# Patient Record
Sex: Female | Born: 1950 | Race: White | Hispanic: No | Marital: Married | Smoking: Never smoker
Health system: Southern US, Community
[De-identification: ages and names within clinical notes are randomized; demographics above are authoritative.]

## PROBLEM LIST (undated history)

## (undated) DIAGNOSIS — F32A Depression, unspecified: Secondary | ICD-10-CM

## (undated) DIAGNOSIS — H269 Unspecified cataract: Secondary | ICD-10-CM

## (undated) DIAGNOSIS — F419 Anxiety disorder, unspecified: Secondary | ICD-10-CM

## (undated) DIAGNOSIS — H409 Unspecified glaucoma: Secondary | ICD-10-CM

## (undated) DIAGNOSIS — F329 Major depressive disorder, single episode, unspecified: Secondary | ICD-10-CM

## (undated) DIAGNOSIS — Z5189 Encounter for other specified aftercare: Secondary | ICD-10-CM

## (undated) DIAGNOSIS — M1990 Unspecified osteoarthritis, unspecified site: Secondary | ICD-10-CM

## (undated) DIAGNOSIS — I4891 Unspecified atrial fibrillation: Secondary | ICD-10-CM

## (undated) DIAGNOSIS — I779 Disorder of arteries and arterioles, unspecified: Secondary | ICD-10-CM

## (undated) DIAGNOSIS — I10 Essential (primary) hypertension: Secondary | ICD-10-CM

## (undated) DIAGNOSIS — I499 Cardiac arrhythmia, unspecified: Secondary | ICD-10-CM

## (undated) DIAGNOSIS — K219 Gastro-esophageal reflux disease without esophagitis: Secondary | ICD-10-CM

## (undated) DIAGNOSIS — D649 Anemia, unspecified: Secondary | ICD-10-CM

## (undated) DIAGNOSIS — I2510 Atherosclerotic heart disease of native coronary artery without angina pectoris: Secondary | ICD-10-CM

## (undated) DIAGNOSIS — E663 Overweight: Secondary | ICD-10-CM

## (undated) DIAGNOSIS — J309 Allergic rhinitis, unspecified: Secondary | ICD-10-CM

## (undated) DIAGNOSIS — E785 Hyperlipidemia, unspecified: Secondary | ICD-10-CM

## (undated) HISTORY — PX: OTHER SURGICAL HISTORY: SHX169

## (undated) HISTORY — PX: COLONOSCOPY: SHX174

## (undated) HISTORY — PX: EYE SURGERY: SHX253

## (undated) HISTORY — DX: Encounter for other specified aftercare: Z51.89

## (undated) HISTORY — PX: GLAUCOMA SURGERY: SHX656

## (undated) HISTORY — PX: TONSILLECTOMY: SUR1361

## (undated) HISTORY — DX: Anxiety disorder, unspecified: F41.9

## (undated) HISTORY — PX: KNEE ARTHROSCOPY: SUR90

## (undated) HISTORY — DX: Depression, unspecified: F32.A

## (undated) HISTORY — DX: Unspecified glaucoma: H40.9

## (undated) HISTORY — DX: Unspecified cataract: H26.9

## (undated) HISTORY — DX: Anemia, unspecified: D64.9

## (undated) HISTORY — DX: Unspecified osteoarthritis, unspecified site: M19.90

## (undated) HISTORY — PX: CATARACT EXTRACTION: SUR2

## (undated) HISTORY — DX: Essential (primary) hypertension: I10

## (undated) HISTORY — PX: ABDOMINAL HYSTERECTOMY: SHX81

## (undated) HISTORY — PX: CARDIAC CATHETERIZATION: SHX172

## (undated) HISTORY — PX: WISDOM TOOTH EXTRACTION: SHX21

## (undated) HISTORY — PX: ROTATOR CUFF REPAIR: SHX139

## (undated) HISTORY — DX: Allergic rhinitis, unspecified: J30.9

## (undated) HISTORY — DX: Major depressive disorder, single episode, unspecified: F32.9

## (undated) HISTORY — DX: Hyperlipidemia, unspecified: E78.5

## (undated) HISTORY — DX: Overweight: E66.3

## (undated) HISTORY — PX: UPPER GASTROINTESTINAL ENDOSCOPY: SHX188

---

## 1999-02-12 HISTORY — PX: OTHER SURGICAL HISTORY: SHX169

## 2004-07-30 ENCOUNTER — Ambulatory Visit (HOSPITAL_COMMUNITY): Admission: RE | Admit: 2004-07-30 | Discharge: 2004-07-30 | Payer: Self-pay | Admitting: Orthopedic Surgery

## 2004-07-30 ENCOUNTER — Ambulatory Visit (HOSPITAL_BASED_OUTPATIENT_CLINIC_OR_DEPARTMENT_OTHER): Admission: RE | Admit: 2004-07-30 | Discharge: 2004-07-30 | Payer: Self-pay | Admitting: Orthopedic Surgery

## 2011-11-13 ENCOUNTER — Telehealth: Payer: Self-pay | Admitting: *Deleted

## 2011-11-13 NOTE — Telephone Encounter (Signed)
Spoke with patients husband and said that the patient will bring medical records to appointment from [facility]

## 2011-11-13 NOTE — Telephone Encounter (Signed)
Spoke with patients primary to get records faxed over patient needs to sign a release form. Primary booked patient another appointment with another cardiologist and already sent records to them but is scheduled with [facility] to see Dr. Excell Seltzer on December 03, 2011

## 2011-12-03 ENCOUNTER — Encounter: Payer: Self-pay | Admitting: Cardiovascular Disease

## 2011-12-03 ENCOUNTER — Ambulatory Visit (INDEPENDENT_AMBULATORY_CARE_PROVIDER_SITE_OTHER): Payer: Managed Care, Other (non HMO) | Admitting: Cardiovascular Disease

## 2011-12-03 ENCOUNTER — Encounter: Payer: Self-pay | Admitting: *Deleted

## 2011-12-03 VITALS — BP 138/73 | HR 74 | Ht 68.0 in | Wt 213.8 lb

## 2011-12-03 DIAGNOSIS — I10 Essential (primary) hypertension: Secondary | ICD-10-CM

## 2011-12-03 DIAGNOSIS — E785 Hyperlipidemia, unspecified: Secondary | ICD-10-CM | POA: Insufficient documentation

## 2011-12-03 DIAGNOSIS — R0989 Other specified symptoms and signs involving the circulatory and respiratory systems: Secondary | ICD-10-CM

## 2011-12-03 DIAGNOSIS — R072 Precordial pain: Secondary | ICD-10-CM

## 2011-12-03 DIAGNOSIS — R079 Chest pain, unspecified: Secondary | ICD-10-CM

## 2011-12-03 NOTE — Assessment & Plan Note (Signed)
The patient has exertional symptoms of neck/jaw pain and dyspnea which certainly could represent an anginal equivalent. Her symptoms are CCS Class 2. She has risk factors of HTN, hyperlipidemia, and family history of CAD. Her EKG is abnormal but nonspecific. I have recommended a Lexiscan Myoview for further risk stratification. If her Myoview is normal, she can follow-up in 1-2 years. If abnormal, will consider further evaluation with cardiac catheterization. As long as low risk, likely will pursue medical therapy as her clinical symptoms are not lifestyle-limiting. She should remain on daily ASA.

## 2011-12-03 NOTE — Patient Instructions (Addendum)
Your physician recommends that you continue on your current medications as directed. Please refer to the Current Medication list given to you today.  Your physician has requested that you have a lexiscan myoview. For further information please visit [email or web address] Please follow instruction sheet, as given.DX: [phone]  Your physician wants you to follow-up in: 2 years with Dr. Excell Seltzer.  You will receive a reminder letter in the mail two months in advance. If you don't receive a letter, please call our office at [phone]  to schedule the follow-up appointment.

## 2011-12-03 NOTE — Assessment & Plan Note (Signed)
Lipids borderline. She is in the process of losing weight (has just lost 13# through diet). Would continue with lifestyle modification for now.

## 2011-12-03 NOTE — Progress Notes (Signed)
Patient ID: Vanessa [name], female   DOB: 1950/09/16, 61 y.o.   MRN: [id number]   HPI:  61 year-old woman presenting for cardiac evaluation. She was noted to have an abnormal EKG with an incomplete RBBB pattern at a recent office visit. She notes exertional symptoms of back, neck, and jaw discomfort with heavy exertion. She does not have these symptoms with moderate level activities such as climbing stairs. She complains of exertional dyspnea and palpitations. She denies lightheadedness, syncope, or resting chest pain. Her work has been more stressful of late. She reports undergoing a cardiac cath about 12-15 years ago and thinks this was normal. She has had no recent cardiac testing. CV risk factors include family history of CAD, borderline hyperlipidemia, and HTN. She is a never smoker and has no hx of diabetes.  Outpatient Encounter Prescriptions as of 12/03/2011  Medication Sig Dispense Refill  . aspirin 81 MG chewable tablet Chew 81 mg by mouth daily.      . brimonidine (ALPHAGAN) 0.15 % ophthalmic solution       . citalopram (CELEXA) 20 MG tablet Take 20 mg by mouth daily.       . Cyanocobalamin (VITAMIN B 12 PO) Take 6,000 mcg by mouth daily.      Marland Kitchen losartan-hydrochlorothiazide (HYZAAR) 100-25 MG per tablet Take 1 tablet by mouth daily.       Marland Kitchen LUMIGAN 0.01 % SOLN       . Multiple Vitamin (MULTIVITAMIN) capsule Take 1 capsule by mouth daily.      Marland Kitchen NEXIUM 40 MG capsule Take 40 mg by mouth daily before breakfast.       . omega-3 acid ethyl esters (LOVAZA) 1 G capsule Take 1 g by mouth daily.      Marland Kitchen pyridOXINE (VITAMIN B-6) 100 MG tablet Take 100 mg by mouth daily.        Codeine  Past Medical History  Diagnosis Date  . Glaucoma   . HTN (hypertension)   . AR (allergic rhinitis)   . Anxiety   . Depressed   . Overweight     Past Surgical History  Procedure Date  . Eye surgery     x2  . Glaucoma surgery     bilateral  . Cataract extraction   . Wisdom tooth extraction   .  Cyrotherapy   . Catherization 11/00    normal  . Abdominal hysterectomy     History   Social History  . Marital Status: Single    Spouse Name: N/A    Number of Children: N/A  . Years of Education: N/A   Occupational History  . Not on file.   Social History Main Topics  . Smoking status: Never Smoker   . Smokeless tobacco: Not on file  . Alcohol Use: No  . Drug Use: No  . Sexually Active: Not on file   Other Topics Concern  . Not on file   Social History Narrative  . No narrative on file    Family History: Father died of an MI at 81, Mother - pacemaker, Brother - HTN  ROS: General: no fevers/chills/night sweats Eyes: no blurry vision, diplopia, or amaurosis ENT: no sore throat or hearing loss Resp: no cough, wheezing, or hemoptysis CV: no edema or palpitations GI: no abdominal pain, nausea, vomiting, diarrhea, or constipation GU: no dysuria, frequency, or hematuria Skin: no rash Neuro: no headache, numbness, tingling, or weakness of extremities Musculoskeletal: no joint pain or swelling Heme: no bleeding, DVT, or easy  bruising Endo: no polydipsia or polyuria  BP 138/73  Pulse 74  Ht 5\' 8"  (1.727 m)  Wt 96.979 kg (213 lb 12.8 oz)  BMI 32.51 kg/m2  PHYSICAL EXAM: Pt is alert and oriented, WD, WN, in no distress. HEENT: normal Neck: JVP normal. Carotid upstrokes normal without bruits. No thyromegaly. Lungs: equal expansion, clear bilaterally CV: Apex is discrete and nondisplaced, RRR without murmur or gallop Abd: soft, NT, +BS, no bruit, no hepatosplenomegaly Back: no CVA tenderness Ext: no C/C/E        Femoral pulses 2+= without bruits        DP/PT pulses intact and = Skin: warm and dry without rash Neuro: CNII-XII intact             Strength intact = bilaterally  EKG:  NSR with incomplete RBBB, HR 69 bpm, tracing dated 09/29/11  Lab review: Chol 189, LDL 117, HDL 39, Trig 167  ASSESSMENT AND PLAN:

## 2011-12-03 NOTE — Assessment & Plan Note (Signed)
Controlled on current Rx 

## 2011-12-15 ENCOUNTER — Ambulatory Visit (HOSPITAL_COMMUNITY): Payer: Managed Care, Other (non HMO) | Attending: Cardiovascular Disease | Admitting: Radiology

## 2011-12-15 VITALS — BP 147/68 | HR 58 | Ht 68.0 in | Wt 210.0 lb

## 2011-12-15 DIAGNOSIS — Z8249 Family history of ischemic heart disease and other diseases of the circulatory system: Secondary | ICD-10-CM | POA: Insufficient documentation

## 2011-12-15 DIAGNOSIS — I4510 Unspecified right bundle-branch block: Secondary | ICD-10-CM

## 2011-12-15 DIAGNOSIS — R000 Tachycardia, unspecified: Secondary | ICD-10-CM | POA: Insufficient documentation

## 2011-12-15 DIAGNOSIS — R0609 Other forms of dyspnea: Secondary | ICD-10-CM

## 2011-12-15 DIAGNOSIS — I10 Essential (primary) hypertension: Secondary | ICD-10-CM | POA: Insufficient documentation

## 2011-12-15 DIAGNOSIS — R072 Precordial pain: Secondary | ICD-10-CM

## 2011-12-15 DIAGNOSIS — R079 Chest pain, unspecified: Secondary | ICD-10-CM | POA: Insufficient documentation

## 2011-12-15 MED ORDER — TECHNETIUM TC 99M TETROFOSMIN IV KIT
30.0000 | PACK | Freq: Once | INTRAVENOUS | Status: AC | PRN
  Administered 2011-12-15: 30 via INTRAVENOUS

## 2011-12-15 MED ORDER — REGADENOSON 0.4 MG/5ML IV SOLN
0.4000 mg | Freq: Once | INTRAVENOUS | Status: AC
  Administered 2011-12-15: 0.4 mg via INTRAVENOUS

## 2011-12-15 MED ORDER — TECHNETIUM TC 99M TETROFOSMIN IV KIT: 33.0000 | PACK | Freq: Once | INTRAVENOUS | Status: DC | PRN

## 2011-12-15 MED ORDER — TECHNETIUM TC 99M TETROFOSMIN IV KIT
11.0000 | PACK | Freq: Once | INTRAVENOUS | Status: AC | PRN
  Administered 2011-12-15: 11 via INTRAVENOUS

## 2011-12-15 MED ORDER — REGADENOSON 0.4 MG/5ML IV SOLN: 0.4000 mg | Freq: Once | INTRAVENOUS | Status: DC

## 2011-12-15 MED ORDER — TECHNETIUM TC 99M TETROFOSMIN IV KIT: 10.0000 | PACK | Freq: Once | INTRAVENOUS | Status: DC | PRN

## 2011-12-15 NOTE — Progress Notes (Signed)
[facility] SITE 3 NUCLEAR MED [address] [place] [address] [phone]  Cardiology Nuclear Med Study  Vanessa [name] is a 61 y.o. female     MRN : [id number]     DOB: 02/04/1951  Procedure Date: 12/15/2011  Nuclear Med Background Indication for Stress Test:  Evaluation for Ischemia and Abnormal EKG History:  '00 GXT:(+)>Cath:Normal Cardiac Risk Factors: Family History - CAD, Hypertension, Lipids, Overweight and RBBB  Symptoms:  Chest Pain>Back/(L) Shoulder, Exertional Neck/Jaw Pain (last episode of chest discomfort was about 40-months ago), Diaphoresis and Rapid HR   Nuclear Pre-Procedure Caffeine/Decaff Intake:  None > 12 hrs NPO After: 8:00pm   Lungs:  Clear. O2 Sat: 96% on room air. IV 0.9% NS with Angio Cath:  20g  IV Site: L Antecubital x 1, tolerated well IV Started by:  Irean Hong, RN  Chest Size (in):  40 Cup Size: C  Height: 5\' 8"  (1.727 m)  Weight:  210 lb (95.255 kg)  BMI:  Body mass index is 31.93 kg/(m^2). Tech Comments:  n/a    Nuclear Med Study 1 or 2 day study: 1 day  Stress Test Type:  Treadmill/Lexiscan  Reading MD: Olga Millers, MD  Order Authorizing Provider:  Tonny Bollman, MD  Resting Radionuclide: Technetium 68m Tetrofosmin  Resting Radionuclide Dose: 11.0 mCi   Stress Radionuclide:  Technetium 62m Tetrofosmin  Stress Radionuclide Dose: 33.0 mCi           Stress Protocol Rest HR: 58 Stress HR: 117  Rest BP: 147/68 Stress BP: 202/47  Exercise Time (min): 2:00 METS: n/a   Predicted Max HR: 158 bpm % Max HR: 74.05 bpm Rate Pressure Product: 21308   Dose of Adenosine (mg):  n/a Dose of Lexiscan: 0.4 mg  Dose of Atropine (mg): n/a Dose of Dobutamine: n/a mcg/kg/min (at max HR)  Stress Test Technologist: Smiley Houseman, CMA-N  Nuclear Technologist:  Domenic Polite, CNMT     Rest Procedure:  Myocardial perfusion imaging was performed at rest 45 minutes following the intravenous administration of Technetium 21m  Tetrofosmin.  Rest ECG: IRBBB with occasional PVC.  Stress Procedure:  The patient received IV Lexiscan 0.4 mg over 15-seconds with concurrent low level exercise and then Technetium 46m Tetrofosmin was injected at 30-seconds while the patient continued walking one more minute. There were ST-T wave changes and occasional PVC's noted with Lexiscan.  She had a hypertensive response.   Quantitative spect images were obtained after a 45-minute delay.  Stress ECG: Significant ST abnormalities consistent with ischemia.  QPS Raw Data Images:  Acquisition technically good; normal left ventricular size. Stress Images:  Normal homogeneous uptake in all areas of the myocardium. Rest Images:  Normal homogeneous uptake in all areas of the myocardium. Subtraction (SDS):  No evidence of ischemia. Transient Ischemic Dilatation (Normal <1.22):  1.01 Lung/Heart Ratio (Normal <0.45):  0.34  Quantitative Gated Spect Images QGS EDV:  78 ml QGS ESV:  30 ml  Impression Exercise Capacity:  Lexiscan with low level exercise. BP Response:  Hypertensive blood pressure response. Clinical Symptoms:  No chest pain or dyspnea. ECG Impression:  Significant ST abnormalities consistent with ischemia. Comparison with Prior Nuclear Study: No previous nuclear study performed  Overall Impression:  Normal stress nuclear study; note ECG changes with stress.  LV Ejection Fraction: 615.  LV Wall Motion:  NL LV Function; NL Wall Motion  Olga Millers

## 2013-12-06 ENCOUNTER — Encounter: Payer: Self-pay | Admitting: Cardiovascular Disease

## 2013-12-06 ENCOUNTER — Ambulatory Visit (INDEPENDENT_AMBULATORY_CARE_PROVIDER_SITE_OTHER): Payer: Managed Care, Other (non HMO) | Admitting: Cardiovascular Disease

## 2013-12-06 VITALS — BP 150/60 | HR 59 | Ht 68.0 in | Wt 172.0 lb

## 2013-12-06 DIAGNOSIS — I10 Essential (primary) hypertension: Secondary | ICD-10-CM

## 2013-12-06 DIAGNOSIS — E785 Hyperlipidemia, unspecified: Secondary | ICD-10-CM

## 2013-12-06 NOTE — Patient Instructions (Signed)
Your physician wants you to follow-up in: 1 YEAR with Dr Cooper.  You will receive a reminder letter in the mail two months in advance. If you don't receive a letter, please call our office to schedule the follow-up appointment.  Your physician recommends that you continue on your current medications as directed. Please refer to the Current Medication list given to you today.  

## 2013-12-06 NOTE — Progress Notes (Signed)
    HPI:  63 year-old woman presenting for followup evaluation. She was initially seen in 2013 after presenting with an abnormal EKG, heart palpitations, and symptoms of exertional back, neck, and jaw discomfort. She underwent a stress Myoview scan that demonstrated no ischemia.  From a cardiac perspective, she has done well with no recurrence of chest pain or palpitations. Her cardiac risk factors include family history of coronary artery disease, borderline hyperlipidemia, and hypertension. The patient has never been a smoker. She has been under a great deal of stress. Her husband has had pancreatic cancer and has undergone Whipple surgery. She takes care of her mother who is not in good health.  Outpatient Encounter Prescriptions as of 12/06/2013  Medication Sig  . aspirin 81 MG tablet Take 81 mg by mouth daily.  . COMBIGAN 0.2-0.5 % ophthalmic solution Place 1 drop into the right eye every 12 (twelve) hours. Eye drops  . losartan-hydrochlorothiazide (HYZAAR) 100-25 MG per tablet Take 1 tablet by mouth daily.   Marland Kitchen LUMIGAN 0.01 % SOLN Place 1 drop into the right eye at bedtime.   . Multiple Vitamin (MULTIVITAMIN) capsule Take 1 capsule by mouth daily.  Marland Kitchen NEXIUM 40 MG capsule Take 40 mg by mouth daily before breakfast.   . [DISCONTINUED] brimonidine (ALPHAGAN) 0.15 % ophthalmic solution   . [DISCONTINUED] aspirin 81 MG chewable tablet Chew 81 mg by mouth daily.  . [DISCONTINUED] citalopram (CELEXA) 20 MG tablet Take 20 mg by mouth daily.   . [DISCONTINUED] Cyanocobalamin (VITAMIN B 12 PO) Take 6,000 mcg by mouth daily.  . [DISCONTINUED] omega-3 acid ethyl esters (LOVAZA) 1 G capsule Take 1 g by mouth daily.  . [DISCONTINUED] pyridOXINE (VITAMIN B-6) 100 MG tablet Take 100 mg by mouth daily.    Allergies  Allergen Reactions  . Codeine     nasuea    Past Medical History  Diagnosis Date  . Glaucoma   . HTN (hypertension)   . AR (allergic rhinitis)   . Anxiety   . Depressed   .  Overweight(278.02)     ROS: Negative except as per HPI  BP 150/60  Pulse 59  Ht 5\' 8"  (1.727 m)  Wt 172 lb (78.019 kg)  BMI 26.16 kg/m2  PHYSICAL EXAM: Pt is alert and oriented, NAD HEENT: normal Neck: JVP - normal, carotids 2+= without bruits Lungs: CTA bilaterally CV: RRR without murmur or gallop Abd: soft, NT, Positive BS, no hepatomegaly Ext: no C/C/E, distal pulses intact and equal Skin: warm/dry no rash  EKG:  Sinus rhythm 59 beats per minute, within normal limits.  ASSESSMENT AND PLAN: 1. Hypertension. Reports good blood pressure control at home with average blood pressures less than 140/80. She will continue on losartan hydrochlorothiazide.  2. Hyperlipidemia. The patient has lost 50 pounds in the past few years. She has done a good job maintaining her weight loss. She reports recent lab work at her primary physician's office, but has not heard back with the results. I suspect her lipids are in a good range considering her diet and sustained weight loss.  For followup I will see her back in one year.  Sherren Mocha MD 12/06/2013 5:35 PM

## 2014-11-01 DIAGNOSIS — M1712 Unilateral primary osteoarthritis, left knee: Secondary | ICD-10-CM | POA: Insufficient documentation

## 2014-11-01 DIAGNOSIS — M25562 Pain in left knee: Secondary | ICD-10-CM | POA: Insufficient documentation

## 2016-02-05 DIAGNOSIS — H402212 Chronic angle-closure glaucoma, right eye, moderate stage: Secondary | ICD-10-CM | POA: Diagnosis not present

## 2016-02-13 ENCOUNTER — Encounter: Payer: Self-pay | Admitting: Physician Assistant

## 2016-02-13 ENCOUNTER — Ambulatory Visit (INDEPENDENT_AMBULATORY_CARE_PROVIDER_SITE_OTHER): Payer: PPO | Admitting: Physician Assistant

## 2016-02-13 VITALS — BP 141/69 | HR 62

## 2016-02-13 DIAGNOSIS — I10 Essential (primary) hypertension: Secondary | ICD-10-CM

## 2016-02-13 DIAGNOSIS — Z01818 Encounter for other preprocedural examination: Secondary | ICD-10-CM

## 2016-02-13 DIAGNOSIS — R739 Hyperglycemia, unspecified: Secondary | ICD-10-CM

## 2016-02-13 DIAGNOSIS — R55 Syncope and collapse: Secondary | ICD-10-CM | POA: Diagnosis not present

## 2016-02-13 DIAGNOSIS — R002 Palpitations: Secondary | ICD-10-CM

## 2016-02-13 DIAGNOSIS — R079 Chest pain, unspecified: Secondary | ICD-10-CM | POA: Diagnosis not present

## 2016-02-13 MED ORDER — METOPROLOL TARTRATE 25 MG PO TABS: 25.0000 mg | ORAL_TABLET | Freq: Every day | ORAL | 3 refills | Status: DC

## 2016-02-13 MED ORDER — METOPROLOL TARTRATE 25 MG PO TABS: ORAL_TABLET | ORAL | 3 refills | Status: DC

## 2016-02-13 NOTE — Progress Notes (Signed)
Cardiology Office Note   Date:  02/13/2016   ID:  Vanessa [name], DOB [birth year], MRN [id number]  PCP:  Myrlene Broker, MD  Cardiologist:  Dr Burt Knack 11/2013 Vanessa Ferries, PA-C   Chief Complaint  Patient presents with  . Chest Pain    pressure , heaviness  . Headache  . Shoulder Pain    History of Present Illness: Vanessa [name] (husband of [name]) is a 65 y.o. female with a history of exertional pain w/ neg MV 2013, FH CAD, HTN, HLD.  Pt called w/ palpitations and chest pain, appt made.  Vanessa [name] presents for Evaluation of her chest pain and palpitations.  [name] only has palpitations every few months. In the past, they have been tachycardia that was mildly symptomatic, and would cause her chest pain. The chest pain itself was mild and would resolve as soon as the palpitations stopped. It is described as a pressure, and 3 or 4/10. Despite her previous EMS experience, she never went to the hospital or called EMS while she was having the palpitations. This was mainly because she did not think they would last long enough for her to get to the hospital. These palpitations she describes as feeling like they were regular but her heart rate was going very fast. They have never been captured on ECG or telemetry strip.  Last Tuesday, she had an entirely different type of palpitation. She was getting ready for work at about 6:30 AM. She had sudden onset of severe palpitations, feeling like her heart was beating out of her chest. She was diaphoretic and a little weak. She did not check her blood pressure heart rate and does not know how fast her heart was going. She went on to work, but her symptoms became worse. When it was time to go to lunch, she had an episode of near syncope. She was still refusing to go to the emergency room or seek medical care.  The palpitations waxed and waned to some but with air all morning long. Just after lunch, at about 1:30 in the afternoon, she  felt very lightheaded, weak and dizzy. She did not lose consciousness but was glad she was sitting down. After this episode, she realized that her palpitations had improved and she actually felt better. The palpitations have not returned since then. That is the first episode she has had of that severity.  On Tuesday when the palpitations were more severe, she had chest discomfort that reached a 5 or 6/10. It did not change with deep inspiration or position. When the palpitations resolved, her symptoms completely disappeared. They have not returned.  When she is not having palpitations, [name] does very well. She does not exercise regularly, but has to go up and down steps all day at work. She has no problem with this. Feeling of shortness of breath or chest discomfort when she is going up or down stairs. She has no history of dyspnea on exertion or chest discomfort except in the setting of the palpitations. She has no signs or symptoms of heart failure, specifically orthopnea, lower extremity edema or PND.   Past Medical History:  Diagnosis Date  . Anxiety   . AR (allergic rhinitis)   . Depressed   . Glaucoma   . HTN (hypertension)   . Overweight(278.02)     Past Surgical History:  Procedure Laterality Date  . ABDOMINAL HYSTERECTOMY    . CATARACT EXTRACTION    . catherization  11/00  normal  . cyrotherapy    . EYE SURGERY     x2  . GLAUCOMA SURGERY     bilateral  . WISDOM TOOTH EXTRACTION      Current Outpatient Prescriptions  Medication Sig Dispense Refill  . aspirin 81 MG tablet Take 81 mg by mouth daily.    . Biotin 5000 MCG CAPS Take 3 capsules by mouth daily.    Marland Kitchen losartan-hydrochlorothiazide (HYZAAR) 100-25 MG per tablet Take 1 tablet by mouth daily.     . multivitamin-lutein (OCUVITE-LUTEIN) CAPS capsule Take 1 capsule by mouth daily.    Marland Kitchen NEXIUM 40 MG capsule Take 40 mg by mouth daily before breakfast.      No current facility-administered medications for this  visit.     Allergies:   Codeine    Social History:  The patient  reports that she has never smoked. She has never used smokeless tobacco. She reports that she does not drink alcohol or use drugs.   Family History:  The patient's family history includes Heart attack in her father; Stroke in her mother.    ROS:  Please see the history of present illness. All other systems are reviewed and negative.    PHYSICAL EXAM: VS:  BP (!) 141/69   Pulse 62  , BMI There is no height or weight on file to calculate BMI. GEN: Well nourished, well developed, female in no acute distress  HEENT: normal for age  Neck: no JVD, no carotid bruit, no masses Cardiac: RRR; no murmur, no rubs, or gallops Respiratory:  clear to auscultation bilaterally, normal work of breathing GI: soft, nontender, nondistended, + BS MS: no deformity or atrophy; no edema; distal pulses are 2+ in all 4 extremities   Skin: warm and dry, no rash Neuro:  Strength and sensation are intact Psych: euthymic mood, full affect   EKG:  EKG is ordered today. The ekg ordered today demonstrates sinus rhythm, no ectopy or arrhythmia. No ST changes indicating ischemia and no Q waves or other indications of an old MI.   Recent Labs: No results found for requested labs within last 8760 hours.    Lipid Panel No results found for: CHOL, TRIG, HDL, CHOLHDL, VLDL, LDLCALC, LDLDIRECT   Wt Readings from Last 3 Encounters:  12/06/13 172 lb (78 kg)  12/15/11 210 lb (95.3 kg)  12/03/11 213 lb 12.8 oz (97 kg)       Other studies Reviewed: Additional studies/ records that were reviewed today include: Office Notes.  ASSESSMENT AND PLAN:  1. Near syncope, Palpitations: The palpitations caused near syncope which is different from previous symptoms. Because of that, I think we need to try to determine what these are. According to [name], she does not get the palpitations very often. Therefore, I do not think an event monitor would be  helpful. I discussed the option of getting a loop recorder and she is agreeable to this.   Before this can be considered, she will need to get an echocardiogram. This has been ordered. Once the testing is completed, we will see her back in the office to determine the next step.  She has a history of sinus bradycardia. Because her heart rate tends to run low baseline, we will not try to add a beta blocker to her medication regimen. However, we will give her prescription for metoprolol to take when necessary or palpitations. She will get labs checked today including CBC, BMET, TSH and hemoglobin A1c.  2. Hypertension: Her blood  pressure is a bit elevated today, but the patient states that it is mainly because of concern about the palpitations. She is confident that her blood pressure is not normally that high. She is encouraged to keep track of it and let us know if it does indeed run that high on a regular basis. She is too bradycardic to a standing beta blocker, for now we will hold off on any new medications.  3. Chest pain: She normally has a good activity level without exertional dyspnea or chest pain. The only time she gets any chest pain is in setting of the palpitations. I discussed the situation with her and she prefers not to do any additional testing at this time. If her symptoms change at all, she understands that she needs to contact us immediately. She also understands that if she gets any additional palpitations, she is to contact EMS immediately.   Current medicines are reviewed at length with the patient today.  The patient does not have concerns regarding medicines.  The following changes have been made:  When necessary metoprolol  Labs/ tests ordered today include:   Orders Placed This Encounter  Procedures  . CBC  . Basic metabolic panel  . TSH  . HgB A1c  . EKG 12-Lead  . ECHOCARDIOGRAM COMPLETE     Disposition:   FU with Dr. Burt Knack  Signed, Barrett, Suanne Marker, PA-C    02/13/2016 7:12 PM    [facility] Phone: [phone]; Fax: [phone]  This note was written with the assistance of speech recognition software. Please excuse any transcriptional errors.

## 2016-02-13 NOTE — Patient Instructions (Addendum)
Medication Instructions:  START Metoprol 25mg  Take HALF (12.5mg ) tablet once a day AS NEEDED FOR PALPITATIONS ONLY HEART RATE TO LOW TO TAKE A WHOLE BETA BLOCKER (METOPROLOL) TAKE HALF TABLET FOR PALPITATIONS AND OTHER HALF IN 1 HOUR IF PALPATIONS ARE NO BETTER  Labwork: Your physician recommends that you return for lab work in: [place], BMET, TSH  LABS MUST BE COMPLETED BEFORE DAY OF PROCEDURE; COMPLETE AS SOON AS POSSIBLE  Testing/Procedures: LOOP RECORDER  Follow-Up: Your physician recommends that you schedule a follow-up appointment in: 1ST AVAILABLE WITH DR Burt Knack  Any Other Special Instructions Will Be Listed Below (If Applicable).  1. IF THE HAPPENS AGAIN GO TO THE CLOSET EMERGENCY DEPARTMENT  2. YOUR PROCEDURE WILL BE SCHEDULED AT [facility] ADMISSIONS  3. YOU HAVE A LOOP RECORDER PROCEDURE SCHEDULED February 18, 2016 AT 7 AM; YOU MUST ARRIVE TO [place] NO LATER THAN 6 AM   If you need a refill on your cardiac medications before your next appointment, please call your pharmacy.

## 2016-02-14 ENCOUNTER — Encounter: Payer: Self-pay | Admitting: Physician Assistant

## 2016-02-14 ENCOUNTER — Telehealth: Payer: Self-pay

## 2016-02-14 NOTE — Telephone Encounter (Signed)
Called and had patients loop recorder appointment rescheduled, left message on patients home phone voicemail informing her of the change, new date and time, advised patient to contact office is she had any questions.   spoke with patient and informed her of new loop recorder appointment. Patient voiced understanding.

## 2016-02-19 ENCOUNTER — Other Ambulatory Visit: Payer: Self-pay

## 2016-02-19 ENCOUNTER — Other Ambulatory Visit
Admission: RE | Admit: 2016-02-19 | Discharge: 2016-02-19 | Disposition: A | Discharge status: Home / Self Care | Payer: PPO | Source: Ambulatory Visit | Attending: Physician Assistant | Admitting: Physician Assistant

## 2016-02-19 ENCOUNTER — Ambulatory Visit (INDEPENDENT_AMBULATORY_CARE_PROVIDER_SITE_OTHER): Payer: PPO

## 2016-02-19 DIAGNOSIS — Z01818 Encounter for other preprocedural examination: Secondary | ICD-10-CM | POA: Insufficient documentation

## 2016-02-19 DIAGNOSIS — R079 Chest pain, unspecified: Secondary | ICD-10-CM | POA: Diagnosis not present

## 2016-02-19 LAB — CBC
HCT: 38.8 % (ref 35.0–47.0)
HEMOGLOBIN: 13.2 g/dL (ref 12.0–16.0)
MCH: 30.6 pg (ref 26.0–34.0)
MCHC: 33.9 g/dL (ref 32.0–36.0)
MCV: 90.3 fL (ref 80.0–100.0)
Platelets: 184 10*3/uL (ref 150–440)
RBC: 4.3 MIL/uL (ref 3.80–5.20)
RDW: 14.5 % (ref 11.5–14.5)
WBC: 8.1 10*3/uL (ref 3.6–11.0)

## 2016-02-19 LAB — BASIC METABOLIC PANEL
Anion gap: 9 (ref 5–15)
BUN: 22 mg/dL — ABNORMAL HIGH (ref 6–20)
CHLORIDE: 102 mmol/L (ref 101–111)
CO2: 30 mmol/L (ref 22–32)
Calcium: 9.3 mg/dL (ref 8.9–10.3)
Creatinine, Ser: 0.92 mg/dL (ref 0.44–1.00)
GFR calc non Af Amer: >60 mL/min (ref >60)
Glucose, Bld: 104 mg/dL — ABNORMAL HIGH (ref 65–99)
POTASSIUM: 3.7 mmol/L (ref 3.5–5.1)
SODIUM: 141 mmol/L (ref 135–145)

## 2016-02-19 LAB — TSH: TSH: 1.643 u[IU]/mL (ref 0.350–4.500)

## 2016-02-20 LAB — HEMOGLOBIN A1C
HEMOGLOBIN A1C: 5.3 % (ref 4.8–5.6)
MEAN PLASMA GLUCOSE: 105 mg/dL

## 2016-02-21 ENCOUNTER — Ambulatory Visit (HOSPITAL_COMMUNITY): Admission: RE | Admit: 2016-02-21 | Payer: PPO | Source: Ambulatory Visit | Admitting: Internal Medicine

## 2016-02-21 ENCOUNTER — Encounter (HOSPITAL_COMMUNITY): Admission: RE | Payer: Self-pay | Source: Ambulatory Visit

## 2016-02-21 SURGERY — LOOP RECORDER INSERTION
Anesthesia: LOCAL

## 2016-02-24 NOTE — Progress Notes (Signed)
Electrophysiology Office Note   Date:  02/25/2016   ID:  Vanessa [name], DOB [birth year], MRN OR:[id number]  PCP:  Myrlene Broker, MD  Cardiologist:  Burt Knack Primary Electrophysiologist:  Will [name], MD    Chief Complaint  Patient presents with  . New Patient (Initial Visit)    Discuss LINQ implant/near syncope     History of Present Illness: Vanessa [name] is a 65 y.o. female who presents today for electrophysiology evaluation.   History of exertional pain w/ neg MV 2013, FH CAD, HTN, HLD.   Palpitations every few months. In the past, they have been tachycardia that was mildly symptomatic, and would cause her chest pain. She never went to the hospital or called EMS while she was having the palpitations.  These palpitations she describes as feeling like they were regular but her heart rate was going very fast. They have never been captured on ECG or telemetry strip.  Late November, she had an entirely different type of palpitation. She was getting ready for work at about 6:30 AM. She had sudden onset of severe palpitations, feeling like her heart was beating out of her chest. She was diaphoretic and a little weak. She went to work, but her symptoms became worse. When it was time to go to lunch, she had an episode of near syncope. She was refused to go to the emergency room.  Just after lunch, at about 1:30 in the afternoon, she felt very lightheaded, weak and dizzy. She did not lose consciousness. After this episode, she realized that her palpitations had improved and she actually felt better. The palpitations have not returned since then. That is the first episode she has had of that severity.Palpitations associated with chest discomfort.  Today, she denies symptoms of palpitations, chest pain, shortness of breath, orthopnea, PND, lower extremity edema, claudication, dizziness, presyncope, syncope, bleeding, or neurologic sequela. The patient is tolerating medications without  difficulties and is otherwise without complaint today. She does say that her her sinuses have been congested over the last few days. She is not had palpitations since her most recent episode as above.  Past Medical History:  Diagnosis Date  . Anxiety   . AR (allergic rhinitis)   . Depressed   . Glaucoma   . HTN (hypertension)   . Overweight(278.02)    Past Surgical History:  Procedure Laterality Date  . ABDOMINAL HYSTERECTOMY    . CATARACT EXTRACTION    . catherization  11/00   normal  . cyrotherapy    . EYE SURGERY     x2  . GLAUCOMA SURGERY     bilateral  . WISDOM TOOTH EXTRACTION       Current Outpatient Prescriptions  Medication Sig Dispense Refill  . Ascorbic Acid (VITAMIN C) 1000 MG tablet Take 1,000 mg by mouth daily.    Marland Kitchen aspirin 81 MG tablet Take 81 mg by mouth daily.    . Biotin 5000 MCG CAPS Take 3 capsules by mouth daily.    Marland Kitchen losartan-hydrochlorothiazide (HYZAAR) 100-25 MG per tablet Take 1 tablet by mouth daily.     . metoprolol tartrate (LOPRESSOR) 25 MG tablet TAKE HALF TABLET FOR PALPITATIONS TAKE OTHER HALF IF PALPITATIONS ARE NO BETTER IN 1 HOUR  ONLY TAKE THIS MED AS NEEDED. 30 tablet 3  . NEXIUM 40 MG capsule Take 40 mg by mouth daily before breakfast.     . Zinc 50 MG CAPS Take 1 capsule by mouth daily.     No current facility-administered  medications for this visit.     Allergies:   Codeine   Social History:  The patient  reports that she has never smoked. She has never used smokeless tobacco. She reports that she does not drink alcohol or use drugs.   Family History:  The patient's family history includes Heart attack in her father; Stroke in her mother.    ROS:  Please see the history of present illness.   Otherwise, review of systems is positive for chest pressure, palpitations, hearing loss, headaches.   All other systems are reviewed and negative.    PHYSICAL EXAM: VS:  BP 132/64   Pulse 70   Ht 5\' 7"  (1.702 m)   Wt 180 lb 3.2 oz (81.7  kg)   BMI 28.22 kg/m  , BMI Body mass index is 28.22 kg/m. GEN: Well nourished, well developed, in no acute distress  HEENT: normal  Neck: no JVD, carotid bruits, or masses Cardiac: RRR; no murmurs, rubs, or gallops,no edema  Respiratory:  clear to auscultation bilaterally, normal work of breathing GI: soft, nontender, nondistended, + BS MS: no deformity or atrophy  Skin: warm and dry Neuro:  Strength and sensation are intact Psych: euthymic mood, full affect  EKG:  EKG is not ordered today. Personal review of the ekg ordered 11/2/17shows sinus rhythm, rate 62  Recent Labs: 02/19/2016: BUN 22; Creatinine, Ser 0.92; Hemoglobin 13.2; Platelets 184; Potassium 3.7; Sodium 141; TSH 1.643    Lipid Panel  No results found for: CHOL, TRIG, HDL, CHOLHDL, VLDL, LDLCALC, LDLDIRECT   Wt Readings from Last 3 Encounters:  02/25/16 180 lb 3.2 oz (81.7 kg)  12/06/13 172 lb (78 kg)  12/15/11 210 lb (95.3 kg)      Other studies Reviewed: Additional studies/ records that were reviewed today include: TTE 02/19/16  Review of the above records today demonstrates:  - Left ventricle: The cavity size was normal. There was mild focal   basal hypertrophy of the septum. Systolic function was normal.   The estimated ejection fraction was in the range of 55% to 60%.   Wall motion was normal; there were no regional wall motion   abnormalities. Doppler parameters are consistent with abnormal   left ventricular relaxation (grade 1 diastolic dysfunction). - Mitral valve: There was mild regurgitation. - Left atrium: The atrium was mildly dilated.  ASSESSMENT AND PLAN:  1.  Palpitations: At this time, it is unclear as to the cause of her palpitations. They do occur at irregular intervals and are not necessarily within a month of each other. Due to that, we'll plan on Linq implantation. Risks and benefits were discussed. Risks include bleeding and infection, among others. She understands these risks and has  agreed to the procedure.  2. Hypertension: Currently well controlled  3. Hyperlipidemia: Continue statin    Current medicines are reviewed at length with the patient today.   The patient does not have concerns regarding her medicines.  The following changes were made today:  none  Labs/ tests ordered today include:  No orders of the defined types were placed in this encounter.    Disposition:   FU with Will Camnitz pending LINQ results  Signed, Will [name], MD  02/25/2016 8:59 AM     [facility] [address] [place] [address] [phone] (office) [phone] (fax)

## 2016-02-25 ENCOUNTER — Other Ambulatory Visit: Payer: Self-pay | Admitting: Cardiology

## 2016-02-25 ENCOUNTER — Ambulatory Visit (INDEPENDENT_AMBULATORY_CARE_PROVIDER_SITE_OTHER): Payer: PPO | Admitting: Cardiology

## 2016-02-25 ENCOUNTER — Encounter (INDEPENDENT_AMBULATORY_CARE_PROVIDER_SITE_OTHER): Payer: Self-pay

## 2016-02-25 ENCOUNTER — Encounter: Payer: Self-pay | Admitting: Cardiology

## 2016-02-25 VITALS — BP 132/64 | HR 70 | Ht 67.0 in | Wt 180.2 lb

## 2016-02-25 DIAGNOSIS — R002 Palpitations: Secondary | ICD-10-CM | POA: Diagnosis not present

## 2016-02-25 NOTE — Patient Instructions (Signed)
Medication Instructions: - Your physician recommends that you continue on your current medications as directed. Please refer to the Current Medication list given to you today.  Labwork: - none ordered  Procedures/Testing: - Your physician has recommended that you have loop recorder (LINQ) implant.  -This is scheduled for- Monday 03/02/16 - Arrive at the [employer], Entrance "A" at [facility] at 6:30 am (this is located on the [facility] side of the hospital). Once you enter, proceed straight down the hall way to Admitting to register, this will be on your left. - You may eat a light breakfast the morning of the procedure - You may take your normal medications that morning  Follow-Up: - Your physician recommends that you schedule a follow-up appointment in: 10-14 days (from 11/20) for a wound check with the [facility].  Any Additional Special Instructions Will Be Listed Below (If Applicable).     If you need a refill on your cardiac medications before your next appointment, please call your pharmacy.

## 2016-03-02 ENCOUNTER — Encounter (HOSPITAL_COMMUNITY): Payer: Self-pay | Admitting: Cardiology

## 2016-03-02 ENCOUNTER — Encounter (HOSPITAL_COMMUNITY)
Admission: RE | Disposition: A | Discharge status: Home / Self Care | Payer: Self-pay | Source: Ambulatory Visit | Attending: Cardiology

## 2016-03-02 ENCOUNTER — Ambulatory Visit (HOSPITAL_COMMUNITY)
Admission: RE | Admit: 2016-03-02 | Discharge: 2016-03-02 | Disposition: A | Discharge status: Home / Self Care | Payer: PPO | Source: Ambulatory Visit | Attending: Cardiology | Admitting: Cardiology

## 2016-03-02 DIAGNOSIS — R002 Palpitations: Secondary | ICD-10-CM | POA: Diagnosis not present

## 2016-03-02 HISTORY — PX: EP IMPLANTABLE DEVICE: SHX172B

## 2016-03-02 SURGERY — LOOP RECORDER INSERTION

## 2016-03-02 MED ORDER — LIDOCAINE-EPINEPHRINE 1 %-1:100000 IJ SOLN
INTRAMUSCULAR | Status: AC
  Filled 2016-03-02: qty 1

## 2016-03-02 MED ORDER — LIDOCAINE-EPINEPHRINE 1 %-1:100000 IJ SOLN
INTRAMUSCULAR | Status: DC | PRN
  Administered 2016-03-02: 20 mL via INTRADERMAL

## 2016-03-02 SURGICAL SUPPLY — 2 items
LOOP REVEAL LINQSYS (Prosthesis & Implant Heart) ×3 IMPLANT
PACK LOOP INSERTION (CUSTOM PROCEDURE TRAY) ×3 IMPLANT

## 2016-03-02 NOTE — H&P (Signed)
Orly Pezzino is a 65 y.o. female with a history of palpitaions that occur every few months.  She presents today for [facility] implantation.  On exam, regular rhythm, no murmurs, lungs clear.  Risks and benefits of the procedure were discussed.  Risks include but not limited to bleeding, infection.  The patient understands the risks and has agreed to the procedure.   Dorianne Perret Curt Bears, MD 03/02/2016 7:11 AM

## 2016-03-11 DIAGNOSIS — M170 Bilateral primary osteoarthritis of knee: Secondary | ICD-10-CM | POA: Diagnosis not present

## 2016-03-11 DIAGNOSIS — Z23 Encounter for immunization: Secondary | ICD-10-CM | POA: Diagnosis not present

## 2016-03-11 DIAGNOSIS — Z0000 Encounter for general adult medical examination without abnormal findings: Secondary | ICD-10-CM | POA: Diagnosis not present

## 2016-03-11 DIAGNOSIS — E8881 Metabolic syndrome: Secondary | ICD-10-CM | POA: Diagnosis not present

## 2016-03-11 DIAGNOSIS — H409 Unspecified glaucoma: Secondary | ICD-10-CM | POA: Diagnosis not present

## 2016-03-11 DIAGNOSIS — K219 Gastro-esophageal reflux disease without esophagitis: Secondary | ICD-10-CM | POA: Diagnosis not present

## 2016-03-11 DIAGNOSIS — F329 Major depressive disorder, single episode, unspecified: Secondary | ICD-10-CM | POA: Diagnosis not present

## 2016-03-11 DIAGNOSIS — E559 Vitamin D deficiency, unspecified: Secondary | ICD-10-CM | POA: Diagnosis not present

## 2016-03-11 DIAGNOSIS — R002 Palpitations: Secondary | ICD-10-CM | POA: Diagnosis not present

## 2016-03-11 DIAGNOSIS — E782 Mixed hyperlipidemia: Secondary | ICD-10-CM | POA: Diagnosis not present

## 2016-03-11 DIAGNOSIS — Z79899 Other long term (current) drug therapy: Secondary | ICD-10-CM | POA: Diagnosis not present

## 2016-03-11 DIAGNOSIS — I10 Essential (primary) hypertension: Secondary | ICD-10-CM | POA: Diagnosis not present

## 2016-03-13 ENCOUNTER — Ambulatory Visit (INDEPENDENT_AMBULATORY_CARE_PROVIDER_SITE_OTHER): Payer: PPO | Admitting: *Deleted

## 2016-03-13 DIAGNOSIS — Z95818 Presence of other cardiac implants and grafts: Secondary | ICD-10-CM

## 2016-03-13 DIAGNOSIS — R002 Palpitations: Secondary | ICD-10-CM

## 2016-03-13 LAB — CUP PACEART INCLINIC DEVICE CHECK
Implantable Pulse Generator Implant Date: 20171120
MDC IDC SESS DTM: 20171201123616

## 2016-03-13 NOTE — Progress Notes (Signed)
Wound check appointment. Steri-strips removed. Wound without redness or edema. Incision edges approximated, wound well healed. Normal device function. Battery status: good. R-waves 0.27mV. No symptom, pause, or brady episodes. 1 tachy episode--AF. 2 AF episodes (3.0% burden), +ASA 81mg . WC made aware of new AF, plan for appointment with WC on 03/16/16 to discuss [place].

## 2016-03-16 ENCOUNTER — Encounter: Payer: Self-pay | Admitting: Cardiology

## 2016-03-16 ENCOUNTER — Ambulatory Visit (INDEPENDENT_AMBULATORY_CARE_PROVIDER_SITE_OTHER): Payer: PPO | Admitting: Cardiology

## 2016-03-16 VITALS — BP 154/70 | HR 64 | Ht 68.0 in | Wt 182.2 lb

## 2016-03-16 DIAGNOSIS — I480 Paroxysmal atrial fibrillation: Secondary | ICD-10-CM | POA: Diagnosis not present

## 2016-03-16 MED ORDER — APIXABAN 5 MG PO TABS: 5.0000 mg | ORAL_TABLET | Freq: Two times a day (BID) | ORAL | 10 refills | Status: DC

## 2016-03-16 MED ORDER — APIXABAN 5 MG PO TABS: 5.0000 mg | ORAL_TABLET | Freq: Two times a day (BID) | ORAL | 0 refills | Status: DC

## 2016-03-16 MED ORDER — FLECAINIDE ACETATE 150 MG PO TABS: 75.0000 mg | ORAL_TABLET | Freq: Two times a day (BID) | ORAL | 6 refills | Status: DC

## 2016-03-16 NOTE — Progress Notes (Signed)
Electrophysiology Office Note   Date:  03/16/2016   ID:  [name], DOB [birth year], MRN OR:[id number]  PCP:  Myrlene Broker, MD  Cardiologist:  Burt Knack Primary Electrophysiologist:  Kima Malenfant [name], MD    Chief Complaint  Patient presents with  . Pacemaker Check    AFIB     History of Present Illness: Vanessa [name] is a 65 y.o. female who presents today for electrophysiology evaluation.   History of exertional pain w/ neg MV 2013, FH CAD, HTN, HLD. Had a LINQ implanted for palpitations found to be in atrial fibrillation. She says that she has had a few further episodes of palpitations since last being seen. She has a 3% burden on her Linq interrogation.  Today, she denies symptoms of palpitations, chest pain, shortness of breath, orthopnea, PND, lower extremity edema, claudication, dizziness, presyncope, syncope, bleeding, or neurologic sequela. The patient is tolerating medications without difficulties and is otherwise without complaint today.   Past Medical History:  Diagnosis Date  . Anxiety   . AR (allergic rhinitis)   . Depressed   . Glaucoma   . HTN (hypertension)   . Overweight(278.02)    Past Surgical History:  Procedure Laterality Date  . ABDOMINAL HYSTERECTOMY    . CATARACT EXTRACTION    . catherization  11/00   normal  . cyrotherapy    . EP IMPLANTABLE DEVICE N/A 03/02/2016   Procedure: Loop Recorder Insertion;  Surgeon: Punam Broussard [name], MD;  Location: [facility];  Service: Cardiovascular;  Laterality: N/A;  . EYE SURGERY     x2  . GLAUCOMA SURGERY     bilateral  . WISDOM TOOTH EXTRACTION       Current Outpatient Prescriptions  Medication Sig Dispense Refill  . Ascorbic Acid (VITAMIN C) 1000 MG tablet Take 1,000 mg by mouth daily.    . Biotin w/ Vitamins C & E (HAIR/SKIN/NAILS PO) Take 3 tablets by mouth daily.    Marland Kitchen CINNAMON PO Take 3,000 mg by mouth daily.    Marland Kitchen losartan-hydrochlorothiazide (HYZAAR) 100-25 MG per tablet Take 1 tablet  by mouth daily.     . metoprolol tartrate (LOPRESSOR) 25 MG tablet TAKE HALF TABLET FOR PALPITATIONS TAKE OTHER HALF IF PALPITATIONS ARE NO BETTER IN 1 HOUR  ONLY TAKE THIS MED AS NEEDED. 30 tablet 3  . Multiple Vitamins-Minerals (OCUVITE PO) Take 1 tablet by mouth daily.    Marland Kitchen NEXIUM 40 MG capsule Take 40 mg by mouth daily before breakfast.     . Zinc 50 MG CAPS Take 1 capsule by mouth daily.    Marland Kitchen apixaban (ELIQUIS) 5 MG TABS tablet Take 1 tablet (5 mg total) by mouth 2 (two) times daily. 60 tablet 0  . flecainide (TAMBOCOR) 150 MG tablet Take 0.5 tablets (75 mg total) by mouth 2 (two) times daily. 30 tablet 6   No current facility-administered medications for this visit.     Allergies:   Codeine   Social History:  The patient  reports that she has never smoked. She has never used smokeless tobacco. She reports that she does not drink alcohol or use drugs.   Family History:  The patient's family history includes Heart attack in her father; Stroke in her mother.    ROS:  Please see the history of present illness.   Otherwise, review of systems is positive for palpitations.   All other systems are reviewed and negative.    PHYSICAL EXAM: VS:  BP (!) 154/70   Pulse  64   Ht 5\' 8"  (1.727 m)   Wt 182 lb 3.2 oz (82.6 kg)   BMI 27.70 kg/m  , BMI Body mass index is 27.7 kg/m. GEN: Well nourished, well developed, in no acute distress  HEENT: normal  Neck: no JVD, carotid bruits, or masses Cardiac: RRR; no murmurs, rubs, or gallops,no edema  Respiratory:  clear to auscultation bilaterally, normal work of breathing GI: soft, nontender, nondistended, + BS MS: no deformity or atrophy  Skin: warm and dry Neuro:  Strength and sensation are intact Psych: euthymic mood, full affect  EKG:  EKG is not ordered today. Personal review of the ekg ordered 11/2/17shows sinus rhythm, rate 62  Recent Labs: 02/19/2016: BUN 22; Creatinine, Ser 0.92; Hemoglobin 13.2; Platelets 184; Potassium 3.7; Sodium  141; TSH 1.643    Lipid Panel  No results found for: CHOL, TRIG, HDL, CHOLHDL, VLDL, LDLCALC, LDLDIRECT   Wt Readings from Last 3 Encounters:  03/16/16 182 lb 3.2 oz (82.6 kg)  03/02/16 180 lb (81.6 kg)  02/25/16 180 lb 3.2 oz (81.7 kg)      Other studies Reviewed: Additional studies/ records that were reviewed today include: TTE 02/19/16  Review of the above records today demonstrates:  - Left ventricle: The cavity size was normal. There was mild focal   basal hypertrophy of the septum. Systolic function was normal.   The estimated ejection fraction was in the range of 55% to 60%.   Wall motion was normal; there were no regional wall motion   abnormalities. Doppler parameters are consistent with abnormal   left ventricular relaxation (grade 1 diastolic dysfunction). - Mitral valve: There was mild regurgitation. - Left atrium: The atrium was mildly dilated.  ASSESSMENT AND PLAN:  1.  Paroxysmal atrial fibrillation: Found on her Linq monitor. We'll continue to monitor for A. fib burden. She has an elevated risk of stroke and therefore we Ryleigh Buenger start her on Eliquis today. We Kim Oki also schedule her metoprolol to take twice daily, and start her on 75 mg of flecainide. She has had a normal nuclear study in 2013 and is without symptoms. Ronasia Isola stop aspirin today.  2. Hypertension: Currently elevated today. We are scheduling her metoprolol. We Cloie Wooden have her come back in 10 days for an EKG and a blood pressure check.  3. Hyperlipidemia: Continue statin    Current medicines are reviewed at length with the patient today.   The patient does not have concerns regarding her medicines.  The following changes were made today:  none  Labs/ tests ordered today include:  No orders of the defined types were placed in this encounter.    Disposition:   FU with Chyann Ambrocio 6 months  Signed, Monisha Siebel [name], MD  03/16/2016 12:35 PM     [place] [address] [phone] (office) [phone] (fax)

## 2016-03-16 NOTE — Patient Instructions (Addendum)
Medication Instructions:   Your physician has recommended you make the following change in your medication:   1) STOP Aspirin  2) START Eliquis 5 mg twice a day  3) START Flecainide 75 mg twice a day  - nurse will call you and instruct you       when to start this medication.   --- If you need a refill on your cardiac medications before your next appointment, please call your pharmacy. ---  Labwork:  None ordered  Testing/Procedures:  None ordered  Follow-Up:  Your physician recommends that you schedule a follow-up appointment in: 10 days for an EKG and BP check.  (Flecainide start)  Your physician wants you to follow-up in: 6 months with Dr. Curt Bears.  You will receive a reminder letter in the mail two months in advance. If you don't receive a letter, please call our office to schedule the follow-up appointment.  Thank you for choosing [facility]!!   Trinidad Curet, RN [phone]  Any Other Special Instructions Will Be Listed Below (If Applicable).  Flecainide tablets What is this medicine? FLECAINIDE (FLEK a nide) is an antiarrhythmic drug. This medicine is used to prevent irregular heart rhythm. It can also slow down fast heartbeats called tachycardia. This medicine may be used for other purposes; ask your health care provider or pharmacist if you have questions. COMMON BRAND NAME(S): Tambocor What should I tell my health care provider before I take this medicine? They need to know if you have any of these conditions: -abnormal levels of potassium in the blood -heart disease including heart rhythm and heart rate problems -kidney or liver disease -recent heart attack -an unusual or allergic reaction to flecainide, local anesthetics, other medicines, foods, dyes, or preservatives -pregnant or trying to get pregnant -breast-feeding How should I use this medicine? Take this medicine by mouth with a glass of water. Follow the directions on the prescription label. You  can take this medicine with or without food. Take your doses at regular intervals. Do not take your medicine more often than directed. Do not stop taking this medicine suddenly. This may cause serious, heart-related side effects. If your doctor wants you to stop the medicine, the dose may be slowly lowered over time to avoid any side effects. Talk to your pediatrician regarding the use of this medicine in children. While this drug may be prescribed for children as young as 1 year of age for selected conditions, precautions do apply. Overdosage: If you think you have taken too much of this medicine contact a poison control center or emergency room at once. NOTE: This medicine is only for you. Do not share this medicine with others. What if I miss a dose? If you miss a dose, take it as soon as you can. If it is almost time for your next dose, take only that dose. Do not take double or extra doses. What may interact with this medicine? Do not take this medicine with any of the following medications: -amoxapine -arsenic trioxide -certain antibiotics like clarithromycin, erythromycin, gatifloxacin, gemifloxacin, levofloxacin, moxifloxacin, sparfloxacin, or troleandomycin -certain antidepressants called tricyclic antidepressants like amitriptyline, imipramine, or nortriptyline -certain medicines to control heart rhythm like disopyramide, dofetilide, encainide, moricizine, procainamide, propafenone, and quinidine -cisapride -cyclobenzaprine -delavirdine -droperidol -haloperidol -hawthorn -imatinib -levomethadyl -maprotiline -medicines for malaria like chloroquine and halofantrine -pentamidine -phenothiazines like chlorpromazine, mesoridazine, prochlorperazine, thioridazine -pimozide -quinine -ranolazine -ritonavir -sertindole -ziprasidone This medicine may also interact with the following medications: -cimetidine -medicines for angina or high blood pressure -  medicines to control heart  rhythm like amiodarone and digoxin This list may not describe all possible interactions. Give your health care provider a list of all the medicines, herbs, non-prescription drugs, or dietary supplements you use. Also tell them if you smoke, drink alcohol, or use illegal drugs. Some items may interact with your medicine. What should I watch for while using this medicine? Visit your doctor or health care professional for regular checks on your progress. Because your condition and the use of this medicine carries some risk, it is a good idea to carry an identification card, necklace or bracelet with details of your condition, medications and doctor or health care professional. Check your blood pressure and pulse rate regularly. Ask your health care professional what your blood pressure and pulse rate should be, and when you should contact him or her. Your doctor or health care professional also may schedule regular blood tests and electrocardiograms to check your progress. You may get drowsy or dizzy. Do not drive, use machinery, or do anything that needs mental alertness until you know how this medicine affects you. Do not stand or sit up quickly, especially if you are an older patient. This reduces the risk of dizzy or fainting spells. Alcohol can make you more dizzy, increase flushing and rapid heartbeats. Avoid alcoholic drinks. What side effects may I notice from receiving this medicine? Side effects that you should report to your doctor or health care professional as soon as possible: -chest pain, continued irregular heartbeats -difficulty breathing -swelling of the legs or feet -trembling, shaking -unusually weak or tired Side effects that usually do not require medical attention (report to your doctor or health care professional if they continue or are bothersome): -blurred vision -constipation -headache -nausea, vomiting -stomach pain This list may not describe all possible side effects.  Call your doctor for medical advice about side effects. You may report side effects to FDA at 1-800-FDA-1088. Where should I keep my medicine? Keep out of the reach of children. Store at room temperature between 15 and 30 degrees C (59 and 86 degrees F). Protect from light. Keep container tightly closed. Throw away any unused medicine after the expiration date. NOTE: This sheet is a summary. It may not cover all possible information. If you have questions about this medicine, talk to your doctor, pharmacist, or health care provider.  2017 Elsevier/Gold Standard (2007-08-03 16:46:09)   Apixaban oral tablets What is this medicine? APIXABAN (a PIX a ban) is an anticoagulant (blood thinner). It is used to lower the chance of stroke in people with a medical condition called atrial fibrillation. It is also used to treat or prevent blood clots in the lungs or in the veins. COMMON BRAND NAME(S): Eliquis What should I tell my health care provider before I take this medicine? They need to know if you have any of these conditions: -bleeding disorders -bleeding in the brain -blood in your stools (black or tarry stools) or if you have blood in your vomit -history of stomach bleeding -kidney disease -liver disease -mechanical heart valve -an unusual or allergic reaction to apixaban, other medicines, foods, dyes, or preservatives -pregnant or trying to get pregnant -breast-feeding How should I use this medicine? Take this medicine by mouth with a glass of water. Follow the directions on the prescription label. You can take it with or without food. If it upsets your stomach, take it with food. Take your medicine at regular intervals. Do not take it more often than directed. Do  not stop taking except on your doctor's advice. Stopping this medicine may increase your risk of a blot clot. Be sure to refill your prescription before you run out of medicine. Talk to your pediatrician regarding the use of this  medicine in children. Special care may be needed. What if I miss a dose? If you miss a dose, take it as soon as you can. If it is almost time for your next dose, take only that dose. Do not take double or extra doses. What may interact with this medicine? This medicine may interact with the following: -aspirin and aspirin-like medicines -certain medicines for fungal infections like ketoconazole and itraconazole -certain medicines for seizures like carbamazepine and phenytoin -certain medicines that treat or prevent blood clots like warfarin, enoxaparin, and dalteparin -clarithromycin -NSAIDs, medicines for pain and inflammation, like ibuprofen or naproxen -rifampin -ritonavir -St. John's wort What should I watch for while using this medicine? Notify your doctor or health care professional and seek emergency treatment if you develop breathing problems; changes in vision; chest pain; severe, sudden headache; pain, swelling, warmth in the leg; trouble speaking; sudden numbness or weakness of the face, arm, or leg. These can be signs that your condition has gotten worse. If you are going to have surgery, tell your doctor or health care professional that you are taking this medicine. Tell your health care professional that you use this medicine before you have a spinal or epidural procedure. Sometimes people who take this medicine have bleeding problems around the spine when they have a spinal or epidural procedure. This bleeding is very rare. If you have a spinal or epidural procedure while on this medicine, call your health care professional immediately if you have back pain, numbness or tingling (especially in your legs and feet), muscle weakness, paralysis, or loss of bladder or bowel control. Avoid sports and activities that might cause injury while you are using this medicine. Severe falls or injuries can cause unseen bleeding. Be careful when using sharp tools or knives. Consider using an  Copy. Take special care brushing or flossing your teeth. Report any injuries, bruising, or red spots on the skin to your doctor or health care professional. What side effects may I notice from receiving this medicine? Side effects that you should report to your doctor or health care professional as soon as possible: -allergic reactions like skin rash, itching or hives, swelling of the face, lips, or tongue -signs and symptoms of bleeding such as bloody or black, tarry stools; red or dark-brown urine; spitting up blood or brown material that looks like coffee grounds; red spots on the skin; unusual bruising or bleeding from the eye, gums, or nose Where should I keep my medicine? Keep out of the reach of children. Store at room temperature between 20 and 25 degrees C (68 and 77 degrees F). Throw away any unused medicine after the expiration date.  2017 Elsevier/Gold Standard (2015-05-02 09:26:49)

## 2016-03-16 NOTE — Addendum Note (Signed)
Addended by: Stanton Kidney on: 03/16/2016 01:02 PM   Modules accepted: Orders

## 2016-03-23 ENCOUNTER — Telehealth: Payer: Self-pay | Admitting: *Deleted

## 2016-03-23 NOTE — Telephone Encounter (Signed)
Advised patient to start Flecainide this Sunday/Monday, 12/17 or 12/18. Patient verbalized understanding and agreeable to plan.  She will follow up on 12/27 for EKG & BP check.

## 2016-03-26 ENCOUNTER — Telehealth: Payer: Self-pay | Admitting: Cardiology

## 2016-03-26 NOTE — Telephone Encounter (Signed)
LMOVM requesting that pt send manual transmission b/c home monitor has not updated in at least 14 days.    

## 2016-04-01 ENCOUNTER — Ambulatory Visit (INDEPENDENT_AMBULATORY_CARE_PROVIDER_SITE_OTHER): Payer: PPO | Admitting: *Deleted

## 2016-04-01 DIAGNOSIS — R002 Palpitations: Secondary | ICD-10-CM | POA: Diagnosis not present

## 2016-04-01 NOTE — Progress Notes (Signed)
Carelink Summary Report / Loop Recorder 

## 2016-04-08 ENCOUNTER — Ambulatory Visit (INDEPENDENT_AMBULATORY_CARE_PROVIDER_SITE_OTHER): Payer: PPO | Admitting: Cardiology

## 2016-04-08 VITALS — BP 160/78 | Ht 68.0 in

## 2016-04-08 DIAGNOSIS — I480 Paroxysmal atrial fibrillation: Secondary | ICD-10-CM

## 2016-04-08 MED ORDER — FLECAINIDE ACETATE 50 MG PO TABS: 75.0000 mg | ORAL_TABLET | Freq: Two times a day (BID) | ORAL | 3 refills | Status: DC

## 2016-04-08 NOTE — Progress Notes (Signed)
Pt comes in today for EKG follow up for Flecainide start, and a BP check. EKG performed & reviewed w/ Dr. Rayann Heman, DOD.  EKG unremarkable - continue Flecainide.  Pt also reports taking Lopressor 25 mg daily at bedtime.  I will update her medication list to reflect this. She states she was instructed by Dr. Curt Bears at last OV to take it daily.  BP still elevated. Pt states this morning around 9:30/10:00am, at home, it was 153/52 Taken here it was:  11:00 160/78, right arm, sitting  11:17 162/72, left arm, sitting  Will forward this to Dr. Curt Bears for advisement on HTN.

## 2016-04-15 NOTE — Progress Notes (Addendum)
04/15/16  4:25pm  Spoke with pt on Friday 12/29. Reports BP improved and does not want to start another medication if she doesn't have to.   BPs 132/51     135/49  Advised patient I would follow up with her this week to determine if BPs are maintaining WNL.   Called today and pt tells me she has not been taking them.  She will monitor them and I will follow up with her next week to discuss readings. She is agreeable to plan.

## 2016-04-20 ENCOUNTER — Telehealth: Payer: Self-pay | Admitting: *Deleted

## 2016-04-20 NOTE — Telephone Encounter (Signed)
Two boxes of eliquis provided for patient.

## 2016-04-28 ENCOUNTER — Other Ambulatory Visit: Payer: Self-pay | Admitting: *Deleted

## 2016-05-01 ENCOUNTER — Ambulatory Visit (INDEPENDENT_AMBULATORY_CARE_PROVIDER_SITE_OTHER): Payer: PPO | Admitting: *Deleted

## 2016-05-01 DIAGNOSIS — R002 Palpitations: Secondary | ICD-10-CM | POA: Diagnosis not present

## 2016-05-04 MED ORDER — FLECAINIDE ACETATE 50 MG PO TABS: 50.0000 mg | ORAL_TABLET | Freq: Two times a day (BID) | ORAL | 3 refills | Status: DC

## 2016-05-04 NOTE — Progress Notes (Addendum)
05/04/2016  2:20pm  Follow up with patient about her BPs.  Reports they are doing much better.  SBP averaging 120s- low 130s.  States highest has been 139/60. Last taken it was 120/49.  She will continue to monitor and call if BP rises above 140. She also tells me that she decreased Flecainide to 50 mg BID.  I will update her chart to reflect new dosage she is taking. Patient verbalized understanding and agreeable to plan.

## 2016-05-04 NOTE — Progress Notes (Signed)
Carelink Summary Report / Loop Recorder 

## 2016-05-04 NOTE — Addendum Note (Signed)
Addended by: Stanton Kidney on: 05/04/2016 02:20 PM   Modules accepted: Orders

## 2016-05-19 LAB — CUP PACEART REMOTE DEVICE CHECK
Date Time Interrogation Session: 20171220123544
MDC IDC PG IMPLANT DT: 20171120

## 2016-05-20 ENCOUNTER — Ambulatory Visit (INDEPENDENT_AMBULATORY_CARE_PROVIDER_SITE_OTHER): Payer: PPO | Admitting: Cardiovascular Disease

## 2016-05-20 ENCOUNTER — Encounter: Payer: Self-pay | Admitting: Cardiovascular Disease

## 2016-05-20 VITALS — BP 138/62 | HR 56 | Ht 68.0 in | Wt 181.4 lb

## 2016-05-20 DIAGNOSIS — I10 Essential (primary) hypertension: Secondary | ICD-10-CM | POA: Diagnosis not present

## 2016-05-20 DIAGNOSIS — I480 Paroxysmal atrial fibrillation: Secondary | ICD-10-CM | POA: Diagnosis not present

## 2016-05-20 NOTE — Patient Instructions (Signed)

## 2016-05-20 NOTE — Progress Notes (Signed)
Cardiology Office Note Date:  05/20/2016   ID:  Vanessa [name], DOB 05-27-1950, MRN [id number]  PCP:  Myrlene Broker, MD  Cardiologist:  Sherren Mocha, MD    Chief Complaint  Patient presents with  . Essential Hypertension     History of Present Illness: Vanessa [name] is a 66 y.o. female who presents for HTN.  She had a stress Myoview scan in the past to evaluate symptoms of chest pain which have not recurred. Her stress study showed no significant ischemia. Since I last saw her she was diagnosed with paroxysmal atrial fibrillation and started on flecainide and eliquis.   She is here alone today. Still taking care of her mother every other wekk and working full time in [place]. She's doing well and has no cardiac-related complaints. Today, she denies symptoms of palpitations, chest pain, shortness of breath, orthopnea, PND, lower extremity edema, dizziness, or syncope.   Past Medical History:  Diagnosis Date  . Anxiety   . AR (allergic rhinitis)   . Depressed   . Glaucoma   . HTN (hypertension)   . Overweight(278.02)     Past Surgical History:  Procedure Laterality Date  . ABDOMINAL HYSTERECTOMY    . CATARACT EXTRACTION    . catherization  11/00   normal  . cyrotherapy    . EP IMPLANTABLE DEVICE N/A 03/02/2016   Procedure: Loop Recorder Insertion;  Surgeon: Will [name], MD;  Location: [facility];  Service: Cardiovascular;  Laterality: N/A;  . EYE SURGERY     x2  . GLAUCOMA SURGERY     bilateral  . WISDOM TOOTH EXTRACTION      Current Outpatient Prescriptions  Medication Sig Dispense Refill  . apixaban (ELIQUIS) 5 MG TABS tablet Take 1 tablet (5 mg total) by mouth 2 (two) times daily. 60 tablet 0  . Ascorbic Acid (VITAMIN C) 1000 MG tablet Take 1,000 mg by mouth daily.    . Biotin w/ Vitamins C & E (HAIR/SKIN/NAILS PO) Take 3 tablets by mouth daily.    Marland Kitchen CINNAMON PO Take 3,000 mg by mouth daily.    . flecainide (TAMBOCOR) 50 MG tablet Take 1 tablet  (50 mg total) by mouth 2 (two) times daily. 60 tablet 3  . losartan-hydrochlorothiazide (HYZAAR) 100-25 MG per tablet Take 1 tablet by mouth daily.     . metoprolol tartrate (LOPRESSOR) 25 MG tablet Take 25 mg by mouth daily.    . Multiple Vitamins-Minerals (OCUVITE PO) Take 1 tablet by mouth daily.    Marland Kitchen NEXIUM 40 MG capsule Take 40 mg by mouth daily before breakfast.     . Zinc 50 MG CAPS Take 1 capsule by mouth daily.     No current facility-administered medications for this visit.     Allergies:   Codeine   Social History:  The patient  reports that she has never smoked. She has never used smokeless tobacco. She reports that she does not drink alcohol or use drugs.   Family History:  The patient's  family history includes Heart attack in her father; Stroke in her mother.    ROS:  Please see the history of present illness.  All other systems are reviewed and negative.    PHYSICAL EXAM: VS:  BP 138/62   Pulse (!) 56   Ht 5\' 8"  (1.727 m)   Wt 181 lb 6.4 oz (82.3 kg)   BMI 27.58 kg/m  , BMI Body mass index is 27.58 kg/m. GEN: Well nourished, well developed,  in no acute distress  HEENT: normal  Neck: no JVD, no masses. No carotid bruits Cardiac: RRR without murmur or gallop                Respiratory:  clear to auscultation bilaterally, normal work of breathing GI: soft, nontender, nondistended, + BS MS: no deformity or atrophy  Ext: no pretibial edema, pedal pulses 2+= bilaterally Skin: warm and dry, no rash Neuro:  Strength and sensation are intact Psych: euthymic mood, full affect  EKG:  EKG is not ordered today.  Recent Labs: 02/19/2016: BUN 22; Creatinine, Ser 0.92; Hemoglobin 13.2; Platelets 184; Potassium 3.7; Sodium 141; TSH 1.643   Lipid Panel  No results found for: CHOL, TRIG, HDL, CHOLHDL, VLDL, LDLCALC, LDLDIRECT    Wt Readings from Last 3 Encounters:  05/20/16 181 lb 6.4 oz (82.3 kg)  03/16/16 182 lb 3.2 oz (82.6 kg)  03/02/16 180 lb (81.6 kg)    Echo  02-19-2016: Study Conclusions  - Left ventricle: The cavity size was normal. There was mild focal   basal hypertrophy of the septum. Systolic function was normal.   The estimated ejection fraction was in the range of 55% to 60%.   Wall motion was normal; there were no regional wall motion   abnormalities. Doppler parameters are consistent with abnormal   left ventricular relaxation (grade 1 diastolic dysfunction). - Mitral valve: There was mild regurgitation. - Left atrium: The atrium was mildly dilated.  ASSESSMENT AND PLAN: 1.  HTN: BP controlled. Continue same Rx. Medications reviewed.   2. Paroxysmal atrial fibrillation: pt appropriately anticoagulated with apixaban. She seems to be maintaining sinus rhythm on flecainide without symptoms of palpitations. Followed by Dr Curt Bears.   Current medicines are reviewed with the patient today.  The patient does not have concerns regarding medicines.  Labs/ tests ordered today include:  No orders of the defined types were placed in this encounter.   Disposition:   FU one year  Signed, Sherren Mocha, MD  05/20/2016 1:59 PM    [facility] [place], [place], [place]  [address] Phone: [phone]; Fax: [phone]

## 2016-06-01 ENCOUNTER — Ambulatory Visit (INDEPENDENT_AMBULATORY_CARE_PROVIDER_SITE_OTHER): Payer: PPO | Admitting: *Deleted

## 2016-06-01 DIAGNOSIS — R002 Palpitations: Secondary | ICD-10-CM

## 2016-06-02 NOTE — Progress Notes (Signed)
Carelink Summary Report / Loop Recorder 

## 2016-06-04 LAB — CUP PACEART REMOTE DEVICE CHECK
Implantable Pulse Generator Implant Date: 20171120
MDC IDC SESS DTM: 20180119130513

## 2016-06-22 DIAGNOSIS — Z1231 Encounter for screening mammogram for malignant neoplasm of breast: Secondary | ICD-10-CM | POA: Diagnosis not present

## 2016-06-23 LAB — CUP PACEART REMOTE DEVICE CHECK
Date Time Interrogation Session: 20180218130615
MDC IDC PG IMPLANT DT: 20171120

## 2016-06-30 ENCOUNTER — Ambulatory Visit (INDEPENDENT_AMBULATORY_CARE_PROVIDER_SITE_OTHER): Payer: PPO | Admitting: *Deleted

## 2016-06-30 DIAGNOSIS — R002 Palpitations: Secondary | ICD-10-CM | POA: Diagnosis not present

## 2016-06-30 NOTE — Progress Notes (Signed)
Carelink Summary Report / Loop Recorder 

## 2016-07-10 ENCOUNTER — Other Ambulatory Visit: Payer: Self-pay | Admitting: Physician Assistant

## 2016-07-13 LAB — CUP PACEART REMOTE DEVICE CHECK
Date Time Interrogation Session: 20180320133923
MDC IDC PG IMPLANT DT: 20171120

## 2016-07-16 ENCOUNTER — Telehealth: Payer: Self-pay | Admitting: Cardiology

## 2016-07-16 NOTE — Telephone Encounter (Signed)
LMOVM requesting that pt send manual transmission b/c home monitor has not updated in at least 14 days.    

## 2016-07-23 ENCOUNTER — Encounter: Payer: Self-pay | Admitting: Cardiology

## 2016-07-30 ENCOUNTER — Ambulatory Visit (INDEPENDENT_AMBULATORY_CARE_PROVIDER_SITE_OTHER): Payer: PPO | Admitting: *Deleted

## 2016-07-30 DIAGNOSIS — R002 Palpitations: Secondary | ICD-10-CM | POA: Diagnosis not present

## 2016-07-31 NOTE — Progress Notes (Signed)
Carelink Summary Report / Loop Recorder 

## 2016-08-08 LAB — CUP PACEART REMOTE DEVICE CHECK
Date Time Interrogation Session: 20180419140650
MDC IDC PG IMPLANT DT: 20171120

## 2016-08-13 ENCOUNTER — Telehealth: Payer: Self-pay | Admitting: Cardiology

## 2016-08-13 NOTE — Telephone Encounter (Signed)
Pt called and stated that she had a episode on 08-07-16 at 11:30PM - 12 (Midnight). Pt did not use her sympton activator for the episode.She stated that she could feel an extra beat. No shortness of breath, no chest pain, no dizziness. Call pt on mobile number. She will send remote transmission around [place]. She is aware someone will call her back tomorrow (Friday 08-14-16)

## 2016-08-14 NOTE — Telephone Encounter (Signed)
Called patient back regarding receiving manual transmission. Patient states she could feel an extra beat while she was laying down 08/07/16, but didn't use her symptom activator. Advised patient Full report reveals No Episodes and current ECG appears SR. Advised patient to use symptom activator when she has these episodes of palpitations. Instructed the LINQ will not pick up PVCs or PACs, extra beats, unless seen while reviewing the symptom activated episodes. Patient verbalized understanding.   She stated she lives with her mom for part of the week to help her out and the Carelink monitor sends automatically, but at her house it does not. Advised the patient I would have [employer] send her a landline adapter and a splitter. Patient was appreciative.

## 2016-08-31 ENCOUNTER — Ambulatory Visit (INDEPENDENT_AMBULATORY_CARE_PROVIDER_SITE_OTHER): Payer: PPO | Admitting: *Deleted

## 2016-08-31 DIAGNOSIS — R002 Palpitations: Secondary | ICD-10-CM | POA: Diagnosis not present

## 2016-08-31 NOTE — Progress Notes (Signed)
Carelink Summary Report / Loop Recorder 

## 2016-09-03 LAB — CUP PACEART REMOTE DEVICE CHECK
Implantable Pulse Generator Implant Date: 20171120
MDC IDC SESS DTM: 20180519143610

## 2016-09-15 ENCOUNTER — Ambulatory Visit (INDEPENDENT_AMBULATORY_CARE_PROVIDER_SITE_OTHER): Payer: PPO | Admitting: Cardiology

## 2016-09-15 ENCOUNTER — Encounter: Payer: Self-pay | Admitting: Cardiology

## 2016-09-15 VITALS — BP 136/60 | HR 52 | Ht 68.0 in | Wt 180.4 lb

## 2016-09-15 DIAGNOSIS — I10 Essential (primary) hypertension: Secondary | ICD-10-CM

## 2016-09-15 DIAGNOSIS — I480 Paroxysmal atrial fibrillation: Secondary | ICD-10-CM | POA: Diagnosis not present

## 2016-09-15 NOTE — Patient Instructions (Signed)
Medication Instructions:  Your physician recommends that you continue on your current medications as directed. Please refer to the Current Medication list given to you today.  If you need a refill on your cardiac medications before your next appointment, please call your pharmacy.   Labwork: None ordered  Testing/Procedures: None ordered  Follow-Up: Your physician wants you to follow-up in: 6 months with Dr. Camnitz.  You will receive a reminder letter in the mail two months in advance. If you don't receive a letter, please call our office to schedule the follow-up appointment.  Thank you for choosing [facility]!!   Jyoti Harju, RN [phone]         

## 2016-09-15 NOTE — Progress Notes (Signed)
Electrophysiology Office Note   Date:  09/15/2016   ID:  Vanessa [name], DOB 10-15-1950, MRN [id number]  PCP:  Myrlene Broker, MD  Cardiologist:  Burt Knack Primary Electrophysiologist:  Chan Sheahan [name], MD    Chief Complaint  Patient presents with  . Atrial Fibrillation     History of Present Illness: Vanessa [name] is a 66 y.o. female who presents today for electrophysiology evaluation.   History of exertional pain w/ neg MV 2013, FH CAD, HTN, HLD. Had a LINQ implanted for palpitations found to be in atrial fibrillation. She says that she has had a few further episodes of palpitations since last being seen. She has a 3% burden on her Linq interrogation.  Today, denies symptoms of palpitations, chest pain, shortness of breath, orthopnea, PND, lower extremity edema, claudication, dizziness, presyncope, syncope, bleeding, or neurologic sequela. The patient is tolerating medications without difficulties and is otherwise without complaint today. She has not had any further episodes of atrial fibrillation on her Linq monitor. She does say that she had an episode of fatigue last weekend, but also happens at other times. She feels that it was due to a long week of work.   Past Medical History:  Diagnosis Date  . Anxiety   . AR (allergic rhinitis)   . Depressed   . Glaucoma   . HTN (hypertension)   . Overweight(278.02)    Past Surgical History:  Procedure Laterality Date  . ABDOMINAL HYSTERECTOMY    . CATARACT EXTRACTION    . catherization  11/00   normal  . cyrotherapy    . EP IMPLANTABLE DEVICE N/A 03/02/2016   Procedure: Loop Recorder Insertion;  Surgeon: Ronelle Smallman [name], MD;  Location: [place] [facility];  Service: Cardiovascular;  Laterality: N/A;  . EYE SURGERY     x2  . GLAUCOMA SURGERY     bilateral  . WISDOM TOOTH EXTRACTION       Current Outpatient Prescriptions  Medication Sig Dispense Refill  . apixaban (ELIQUIS) 5 MG TABS tablet Take 1 tablet (5 mg total)  by mouth 2 (two) times daily. 60 tablet 0  . flecainide (TAMBOCOR) 50 MG tablet Take 1 tablet (50 mg total) by mouth 2 (two) times daily. 60 tablet 3  . Garlic (GARLIQUE PO) Take 1 tablet by mouth daily.    Marland Kitchen losartan-hydrochlorothiazide (HYZAAR) 100-25 MG per tablet Take 1 tablet by mouth daily.     . metoprolol tartrate (LOPRESSOR) 25 MG tablet Take 25 mg by mouth daily.    . Multiple Vitamins-Minerals (OCUVITE PO) Take 1 tablet by mouth daily.    Marland Kitchen NEXIUM 40 MG capsule Take 40 mg by mouth daily before breakfast.      No current facility-administered medications for this visit.     Allergies:   Codeine   Social History:  The patient  reports that she has never smoked. She has never used smokeless tobacco. She reports that she does not drink alcohol or use drugs.   Family History:  The patient's family history includes Heart attack in her father; Stroke in her mother.    ROS:  Please see the history of present illness.   Otherwise, review of systems is positive for Hearing loss, abdominal pain, headaches, back pain.   All other systems are reviewed and negative.     PHYSICAL EXAM: VS:  BP 136/60   Pulse (!) 52   Ht 5\' 8"  (1.727 m)   Wt 180 lb 6.4 oz (81.8 kg)  BMI 27.43 kg/m  , BMI Body mass index is 27.43 kg/m. GEN: Well nourished, well developed, in no acute distress  HEENT: normal  Neck: no JVD, carotid bruits, or masses Cardiac: RRR; no murmurs, rubs, or gallops,no edema  Respiratory:  clear to auscultation bilaterally, normal work of breathing GI: soft, nontender, nondistended, + BS MS: no deformity or atrophy  Skin: warm and dry, device site well healed Neuro:  Strength and sensation are intact Psych: euthymic mood, full affect  EKG:  EKG is ordered today. Personal review of the ekg ordered sinus rhythm, rate 52, incomplete right bundle-branch block  Personal review of the device interrogation today. Results in [place]: 02/19/2016: BUN 22;  Creatinine, Ser 0.92; Hemoglobin 13.2; Platelets 184; Potassium 3.7; Sodium 141; TSH 1.643    Lipid Panel  No results found for: CHOL, TRIG, HDL, CHOLHDL, VLDL, LDLCALC, LDLDIRECT   Wt Readings from Last 3 Encounters:  09/15/16 180 lb 6.4 oz (81.8 kg)  05/20/16 181 lb 6.4 oz (82.3 kg)  03/16/16 182 lb 3.2 oz (82.6 kg)      Other studies Reviewed: Additional studies/ records that were reviewed today include: TTE 02/19/16  Review of the above records today demonstrates:  - Left ventricle: The cavity size was normal. There was mild focal   basal hypertrophy of the septum. Systolic function was normal.   The estimated ejection fraction was in the range of 55% to 60%.   Wall motion was normal; there were no regional wall motion   abnormalities. Doppler parameters are consistent with abnormal   left ventricular relaxation (grade 1 diastolic dysfunction). - Mitral valve: There was mild regurgitation. - Left atrium: The atrium was mildly dilated.  ASSESSMENT AND PLAN:  1.  Paroxysmal atrial fibrillation: 3% burden found on her Linq monitor, after was implanted. Is currently on flecainide, metoprolol, and Eliquis. Has had no further episodes of atrial fibrillation since starting flecainide.  This patients CHA2DS2-VASc Score and unadjusted Ischemic Stroke Rate (% per year) is equal to 3.2 % stroke rate/year from a score of 3  Above score calculated as 1 point each if present [CHF, HTN, DM, Vascular=MI/PAD/Aortic Plaque, Age if 65-74, or Female] Above score calculated as 2 points each if present [Age > 75, or Stroke/TIA/TE]  2. Hypertension: Well-controlled today. No changes at this time. Would pressure at home is in the 120s over 50s to 60s.  3. Hyperlipidemia: Continue statin    Current medicines are reviewed at length with the patient today.   The patient does not have concerns regarding her medicines.  The following changes were made today:  None  Labs/ tests ordered today  include:  Orders Placed This Encounter  Procedures  . EKG 12-Lead     Disposition:   FU with Salvadore Valvano 6 months  Signed, Maximiliano Cromartie [name], MD  09/15/2016 8:40 AM     [facility] [address] [place] [address] [phone] (office) [phone] (fax)

## 2016-09-28 ENCOUNTER — Ambulatory Visit (INDEPENDENT_AMBULATORY_CARE_PROVIDER_SITE_OTHER): Payer: PPO | Admitting: *Deleted

## 2016-09-28 DIAGNOSIS — R002 Palpitations: Secondary | ICD-10-CM | POA: Diagnosis not present

## 2016-09-29 NOTE — Progress Notes (Signed)
Carelink Summary Report / Loop Recorder 

## 2016-10-01 ENCOUNTER — Telehealth: Payer: Self-pay | Admitting: Cardiology

## 2016-10-01 NOTE — Telephone Encounter (Signed)
LMOVM requesting that pt send manual transmission b/c home monitor has not updated in at least 14 days.    

## 2016-10-06 LAB — CUP PACEART REMOTE DEVICE CHECK
Implantable Pulse Generator Implant Date: 20171120
MDC IDC SESS DTM: 20180618153807

## 2016-10-28 ENCOUNTER — Emergency Department: Payer: PPO

## 2016-10-28 ENCOUNTER — Ambulatory Visit (INDEPENDENT_AMBULATORY_CARE_PROVIDER_SITE_OTHER): Payer: PPO | Admitting: *Deleted

## 2016-10-28 ENCOUNTER — Emergency Department
Admission: EM | Admit: 2016-10-28 | Discharge: 2016-10-28 | Disposition: A | Discharge status: Home / Self Care | Payer: PPO | Attending: Emergency Medicine | Admitting: Emergency Medicine

## 2016-10-28 DIAGNOSIS — I4891 Unspecified atrial fibrillation: Secondary | ICD-10-CM | POA: Diagnosis not present

## 2016-10-28 DIAGNOSIS — I10 Essential (primary) hypertension: Secondary | ICD-10-CM | POA: Insufficient documentation

## 2016-10-28 DIAGNOSIS — R1084 Generalized abdominal pain: Secondary | ICD-10-CM | POA: Diagnosis not present

## 2016-10-28 DIAGNOSIS — R002 Palpitations: Secondary | ICD-10-CM

## 2016-10-28 DIAGNOSIS — Z79899 Other long term (current) drug therapy: Secondary | ICD-10-CM | POA: Diagnosis not present

## 2016-10-28 DIAGNOSIS — R55 Syncope and collapse: Secondary | ICD-10-CM | POA: Insufficient documentation

## 2016-10-28 DIAGNOSIS — R42 Dizziness and giddiness: Secondary | ICD-10-CM | POA: Insufficient documentation

## 2016-10-28 DIAGNOSIS — Z95818 Presence of other cardiac implants and grafts: Secondary | ICD-10-CM | POA: Diagnosis not present

## 2016-10-28 DIAGNOSIS — R109 Unspecified abdominal pain: Secondary | ICD-10-CM | POA: Diagnosis not present

## 2016-10-28 HISTORY — DX: Unspecified atrial fibrillation: I48.91

## 2016-10-28 LAB — HEPATIC FUNCTION PANEL
ALT: 12 U/L — ABNORMAL LOW (ref 14–54)
AST: 22 U/L (ref 15–41)
Albumin: 3.9 g/dL (ref 3.5–5.0)
Alkaline Phosphatase: 56 U/L (ref 38–126)
BILIRUBIN DIRECT: 0.2 mg/dL (ref 0.1–0.5)
BILIRUBIN TOTAL: 1 mg/dL (ref 0.3–1.2)
Indirect Bilirubin: 0.8 mg/dL (ref 0.3–0.9)
Total Protein: 6.9 g/dL (ref 6.5–8.1)

## 2016-10-28 LAB — BASIC METABOLIC PANEL
ANION GAP: 9 (ref 5–15)
BUN: 21 mg/dL — ABNORMAL HIGH (ref 6–20)
CALCIUM: 9.1 mg/dL (ref 8.9–10.3)
CO2: 27 mmol/L (ref 22–32)
Chloride: 102 mmol/L (ref 101–111)
Creatinine, Ser: 1.22 mg/dL — ABNORMAL HIGH (ref 0.44–1.00)
GFR, EST AFRICAN AMERICAN: 52 mL/min — AB (ref >60)
GFR, EST NON AFRICAN AMERICAN: 45 mL/min — AB (ref >60)
Glucose, Bld: 164 mg/dL — ABNORMAL HIGH (ref 65–99)
Potassium: 4.1 mmol/L (ref 3.5–5.1)
SODIUM: 138 mmol/L (ref 135–145)

## 2016-10-28 LAB — CBC
HCT: 37.7 % (ref 35.0–47.0)
HEMOGLOBIN: 12.8 g/dL (ref 12.0–16.0)
MCH: 30.6 pg (ref 26.0–34.0)
MCHC: 33.9 g/dL (ref 32.0–36.0)
MCV: 90.1 fL (ref 80.0–100.0)
Platelets: 202 10*3/uL (ref 150–440)
RBC: 4.19 MIL/uL (ref 3.80–5.20)
RDW: 14.6 % — ABNORMAL HIGH (ref 11.5–14.5)
WBC: 10 10*3/uL (ref 3.6–11.0)

## 2016-10-28 LAB — PROTIME-INR
INR: 1.24
PROTHROMBIN TIME: 15.7 s — AB (ref 11.4–15.2)

## 2016-10-28 LAB — TROPONIN I: Troponin I: <0.03 ng/mL (ref <0.03)

## 2016-10-28 LAB — LIPASE, BLOOD: LIPASE: 39 U/L (ref 11–51)

## 2016-10-28 MED ORDER — IOPAMIDOL (ISOVUE-370) INJECTION 76%
75.0000 mL | Freq: Once | INTRAVENOUS | Status: AC | PRN
  Administered 2016-10-28: 75 mL via INTRAVENOUS
  Filled 2016-10-28: qty 75

## 2016-10-28 NOTE — Discharge Instructions (Signed)
Results for orders placed or performed during the hospital encounter of 14/43/15  Basic metabolic panel  Result Value Ref Range   Sodium 138 135 - 145 mmol/L   Potassium 4.1 3.5 - 5.1 mmol/L   Chloride 102 101 - 111 mmol/L   CO2 27 22 - 32 mmol/L   Glucose, Bld 164 (H) 65 - 99 mg/dL   BUN 21 (H) 6 - 20 mg/dL   Creatinine, Ser 1.22 (H) 0.44 - 1.00 mg/dL   Calcium 9.1 8.9 - 10.3 mg/dL   GFR calc non Af Amer 45 (L) >60 mL/min   GFR calc Af Amer 52 (L) >60 mL/min   Anion gap 9 5 - 15  CBC  Result Value Ref Range   WBC 10.0 3.6 - 11.0 K/uL   RBC 4.19 3.80 - 5.20 MIL/uL   Hemoglobin 12.8 12.0 - 16.0 g/dL   HCT 37.7 35.0 - 47.0 %   MCV 90.1 80.0 - 100.0 fL   MCH 30.6 26.0 - 34.0 pg   MCHC 33.9 32.0 - 36.0 g/dL   RDW 14.6 (H) 11.5 - 14.5 %   Platelets 202 150 - 440 K/uL  Protime-INR- (order if Patient is taking Coumadin / Warfarin)  Result Value Ref Range   Prothrombin Time 15.7 (H) 11.4 - 15.2 seconds   INR 1.24   Troponin I  Result Value Ref Range   Troponin I <0.03 <0.03 ng/mL  Hepatic function panel  Result Value Ref Range   Total Protein 6.9 6.5 - 8.1 g/dL   Albumin 3.9 3.5 - 5.0 g/dL   AST 22 15 - 41 U/L   ALT 12 (L) 14 - 54 U/L   Alkaline Phosphatase 56 38 - 126 U/L   Total Bilirubin 1.0 0.3 - 1.2 mg/dL   Bilirubin, Direct 0.2 0.1 - 0.5 mg/dL   Indirect Bilirubin 0.8 0.3 - 0.9 mg/dL  Lipase, blood  Result Value Ref Range   Lipase 39 11 - 51 U/L   Dg Chest 2 View  Result Date: 10/28/2016 CLINICAL DATA:  66 y/o F; syncope and history of atrial fibrillation. EXAM: CHEST  2 VIEW COMPARISON:  None. FINDINGS: Normal cardiac silhouette. Loop recorder device. Aortic atherosclerosis with calcification. Minor biapical pleuroparenchymal scarring of the lungs. No focal consolidation. No pleural effusion or pneumothorax. Bones are unremarkable. IMPRESSION: No acute pulmonary process identified.  Aortic atherosclerosis. Electronically Signed   By: Kristine Garbe M.D.   On:  10/28/2016 15:46   Ct Angio Abd/pel W And/or Wo Contrast  Result Date: 10/28/2016 CLINICAL DATA:  Initial evaluation for acute abdominal pain, near syncope, diaphoresis, hypotension. EXAM: CTA ABDOMEN AND PELVIS wITHOUT AND WITH CONTRAST TECHNIQUE: Multidetector CT imaging of the abdomen and pelvis was performed using the standard protocol during bolus administration of intravenous contrast. Multiplanar reconstructed images and MIPs were obtained and reviewed to evaluate the vascular anatomy. CONTRAST:  75 cc of Isovue 370. COMPARISON:  None available. FINDINGS: VASCULAR Aorta: Scattered atheromatous plaque present within the intra-abdominal aorta up without evidence for aneurysm or dissection. No other acute aortic pathology. Celiac: Focal plaque at the origin of the celiac access with stenosis of approximately up to 50% the celiac access and its branch vessels widely patent distally SMA: Focal plaque at the origin of the SMA with associated stenosis of approximate 30-40%. SMA widely patent distally. Renals: Single left renal artery with level right renal artery's. Focal plaque at the origin of the left renal artery with moderate stenosis. Hypoplastic right renal arteries are  patent. IMA: IMA is patent proximally. Inflow: Iliac arteries are well opacified and widely patent bilaterally. Scattered atheromatous plaque noted within the bilateral common and internal iliac vessels. Proximal Outflow: Patent without stenosis. Veins: No venous abnormality. Review of the MIP images confirms the above findings. NON-VASCULAR Lower chest: Visualized lung bases are clear. Hepatobiliary: 15 mm cyst noted within the left hepatic lobe. Additional subcentimeter hypodensity within the right hepatic lobe noted, too small the characterize, but likely reflects a small cyst as well. Liver otherwise unremarkable. Gallbladder largely contracted without acute abnormality. No biliary dilatation. Pancreas: Pancreas within normal limits.  Spleen: Spleen within normal limits. Adrenals/Urinary Tract: Adrenal glands are normal. Kidneys equal in size with symmetric enhancement. No nephrolithiasis, hydronephrosis, or focal enhancing renal mass. No hydroureter. Bladder largely decompressed without acute abnormality. Stomach/Bowel: Stomach within normal limits. No evidence for bowel obstruction. Appendix within normal limits. No acute inflammatory changes seen about the bowels. Lymphatic: No adenopathy. Reproductive: Uterus is absent.  Ovaries not discretely identified. Other: No free air or fluid. Musculoskeletal: Compression deformity involving the T12 superior endplate is chronic in appearance. No acute osseous abnormality. No worrisome lytic or blastic osseous lesions. IMPRESSION: VASCULAR 1. No acute vascular abnormality identified within the major arterial vasculature of the abdomen and pelvis. No evidence for dissection or thrombus. 2. Moderate atherosclerosis as above. NON-VASCULAR No other acute abnormality identified within the abdomen and pelvis. Electronically Signed   By: Jeannine Boga M.D.   On: 10/28/2016 19:18

## 2016-10-28 NOTE — Progress Notes (Signed)
Carelink Summary Report / Loop Recorder 

## 2016-10-28 NOTE — ED Provider Notes (Signed)
[facility] Emergency Department Provider Note  ____________________________________________  Time seen: Approximately 7:15 PM  I have reviewed the triage vital signs and the nursing notes.   HISTORY  Chief Complaint Near Syncope    HPI Vanessa [name] is a 66 y.o. female who was in her usual state of health when today around 1 or 2 PM she felt like she needed to have a bowel movement and was having cramping diffuse abdominal pain. She went to the toilet, had a bowel movement but then quickly became lightheaded and diaphoretic. She was found to be drenched with sweat pale and disoriented and EMS were called. Initial blood pressure was noted to be about 80/60 with a slow heart rate. Patient given some IV fluids prior to arrival in the ED. She is feeling better. Abdominal pain has resolved, but it did radiate to the back earlier. Never had anything like this before. There is no aggravating or alleviating factor to the pain, which was severe when present.  She has a history of a loop recorder for her heart due to paroxysmal A. fib. She takes flecainide twice a day and has been compliant with her medicines. She is on Eliquis as well. Patient did not pass out    Past Medical History:  Diagnosis Date  . Anxiety   . AR (allergic rhinitis)   . Atrial fibrillation ([place])   . Depressed   . Glaucoma   . HTN (hypertension)   . Overweight(278.02)      Patient Active Problem List   Diagnosis Date Noted  . Chest pain 12/03/2011  . HTN (hypertension) 12/03/2011  . Hyperlipidemia 12/03/2011     Past Surgical History:  Procedure Laterality Date  . ABDOMINAL HYSTERECTOMY    . CATARACT EXTRACTION    . catherization  11/00   normal  . cyrotherapy    . EP IMPLANTABLE DEVICE N/A 03/02/2016   Procedure: Loop Recorder Insertion;  Surgeon: Will [name], MD;  Location: [place] [facility];  Service: Cardiovascular;  Laterality: N/A;  . EYE SURGERY     x2  .  GLAUCOMA SURGERY     bilateral  . WISDOM TOOTH EXTRACTION       Prior to Admission medications   Medication Sig Start Date End Date Taking? Authorizing Provider  apixaban (ELIQUIS) 5 MG TABS tablet Take 1 tablet (5 mg total) by mouth 2 (two) times daily. 03/16/16   Camnitz, Ocie Doyne, MD  flecainide (TAMBOCOR) 50 MG tablet Take 1 tablet (50 mg total) by mouth 2 (two) times daily. 05/04/16   Camnitz, Will Hassell Done, MD  Garlic (GARLIQUE PO) Take 1 tablet by mouth daily.    [provider]  losartan-hydrochlorothiazide (HYZAAR) 100-25 MG per tablet Take 1 tablet by mouth daily.  10/29/11   [provider]  metoprolol tartrate (LOPRESSOR) 25 MG tablet Take 25 mg by mouth daily.    [provider]  Multiple Vitamins-Minerals (OCUVITE PO) Take 1 tablet by mouth daily.    [provider]  NEXIUM 40 MG capsule Take 40 mg by mouth daily before breakfast.  09/29/11   [provider]     Allergies Codeine   Family History  Problem Relation Age of Onset  . Stroke Mother   . Heart attack Father     Social History Social History  Substance Use Topics  . Smoking status: Never Smoker  . Smokeless tobacco: Never Used  . Alcohol use No    Review of Systems  Constitutional:  No fever or chills.  ENT:   No sore throat. No rhinorrhea. Cardiovascular:   No chest pain or syncope.No palpitations Respiratory:   No dyspnea or cough. Gastrointestinal:   Abdominal pain as above without vomiting and diarrhea.  Musculoskeletal:   Negative for focal pain or swelling All other systems reviewed and are negative except as documented above in ROS and HPI.  ____________________________________________   PHYSICAL EXAM:  VITAL SIGNS: ED Triage Vitals  Enc Vitals Group     BP 10/28/16 1520 (!) 114/54     Pulse Rate 10/28/16 1520 (!) 59     Resp 10/28/16 1520 18     Temp 10/28/16 1520 97.9 F (36.6 C)     Temp Source 10/28/16 1520 Oral     SpO2 10/28/16  1520 100 %     Weight 10/28/16 1521 177 lb (80.3 kg)     Height 10/28/16 1521 5\' 8"  (1.727 m)     Head Circumference --      Peak Flow --      Pain Score --      Pain Loc --      Pain Edu? --      Excl. in [place]? --     Vital signs reviewed, nursing assessments reviewed.   Constitutional:   Alert and oriented. Well appearing and in no distress. Eyes:   No scleral icterus.  EOMI. No nystagmus. No conjunctival pallor. PERRL. ENT   Head:   Normocephalic and atraumatic.   Nose:   No congestion/rhinnorhea.    Mouth/Throat:   MMM, no pharyngeal erythema. No peritonsillar mass.    Neck:   No meningismus. Full ROM Hematological/Lymphatic/Immunilogical:   No cervical lymphadenopathy. Cardiovascular:   RRR. Symmetric bilateral radial and DP pulses.  No murmurs.  Respiratory:   Normal respiratory effort without tachypnea/retractions. Breath sounds are clear and equal bilaterally. No wheezes/rales/rhonchi. Gastrointestinal:   Soft With mild central abdominal tenderness. Non distended. There is no CVA tenderness.  No rebound, rigidity, or guarding. Genitourinary:   deferred Musculoskeletal:   Normal range of motion in all extremities. No joint effusions.  No lower extremity tenderness.  No edema. Neurologic:   Normal speech and language.  Motor grossly intact. No gross focal neurologic deficits are appreciated.  Skin:    Skin is warm, dry and intact. No rash noted.  No petechiae, purpura, or bullae.  ____________________________________________    LABS (pertinent positives/negatives) (all labs ordered are listed, but only abnormal results are displayed) Labs Reviewed  BASIC METABOLIC PANEL - Abnormal; Notable for the following:       Result Value   Glucose, Bld 164 (*)    BUN 21 (*)    Creatinine, Ser 1.22 (*)    GFR calc non Af Amer 45 (*)    GFR calc Af Amer 52 (*)    All other components within normal limits  CBC - Abnormal; Notable for the following:    RDW 14.6 (*)     All other components within normal limits  PROTIME-INR - Abnormal; Notable for the following:    Prothrombin Time 15.7 (*)    All other components within normal limits  HEPATIC FUNCTION PANEL - Abnormal; Notable for the following:    ALT 12 (*)    All other components within normal limits  TROPONIN I  LIPASE, BLOOD  URINALYSIS, COMPLETE (UACMP) WITH MICROSCOPIC  CBG MONITORING, ED   ____________________________________________   EKG  Interpreted by me Sinus bradycardia rate of 56, normal axis and  intervals. Incomplete right bundle branch block. Normal ST segments. Normal T waves.  ____________________________________________    RADIOLOGY  Dg Chest 2 View  Result Date: 10/28/2016 CLINICAL DATA:  66 y/o F; syncope and history of atrial fibrillation. EXAM: CHEST  2 VIEW COMPARISON:  None. FINDINGS: Normal cardiac silhouette. Loop recorder device. Aortic atherosclerosis with calcification. Minor biapical pleuroparenchymal scarring of the lungs. No focal consolidation. No pleural effusion or pneumothorax. Bones are unremarkable. IMPRESSION: No acute pulmonary process identified.  Aortic atherosclerosis. Electronically Signed   By: Kristine Garbe M.D.   On: 10/28/2016 15:46   Ct Angio Abd/pel W And/or Wo Contrast  Result Date: 10/28/2016 CLINICAL DATA:  Initial evaluation for acute abdominal pain, near syncope, diaphoresis, hypotension. EXAM: CTA ABDOMEN AND PELVIS wITHOUT AND WITH CONTRAST TECHNIQUE: Multidetector CT imaging of the abdomen and pelvis was performed using the standard protocol during bolus administration of intravenous contrast. Multiplanar reconstructed images and MIPs were obtained and reviewed to evaluate the vascular anatomy. CONTRAST:  75 cc of Isovue 370. COMPARISON:  None available. FINDINGS: VASCULAR Aorta: Scattered atheromatous plaque present within the intra-abdominal aorta up without evidence for aneurysm or dissection. No other acute aortic  pathology. Celiac: Focal plaque at the origin of the celiac access with stenosis of approximately up to 50% the celiac access and its branch vessels widely patent distally SMA: Focal plaque at the origin of the SMA with associated stenosis of approximate 30-40%. SMA widely patent distally. Renals: Single left renal artery with level right renal artery's. Focal plaque at the origin of the left renal artery with moderate stenosis. Hypoplastic right renal arteries are patent. IMA: IMA is patent proximally. Inflow: Iliac arteries are well opacified and widely patent bilaterally. Scattered atheromatous plaque noted within the bilateral common and internal iliac vessels. Proximal Outflow: Patent without stenosis. Veins: No venous abnormality. Review of the MIP images confirms the above findings. NON-VASCULAR Lower chest: Visualized lung bases are clear. Hepatobiliary: 15 mm cyst noted within the left hepatic lobe. Additional subcentimeter hypodensity within the right hepatic lobe noted, too small the characterize, but likely reflects a small cyst as well. Liver otherwise unremarkable. Gallbladder largely contracted without acute abnormality. No biliary dilatation. Pancreas: Pancreas within normal limits. Spleen: Spleen within normal limits. Adrenals/Urinary Tract: Adrenal glands are normal. Kidneys equal in size with symmetric enhancement. No nephrolithiasis, hydronephrosis, or focal enhancing renal mass. No hydroureter. Bladder largely decompressed without acute abnormality. Stomach/Bowel: Stomach within normal limits. No evidence for bowel obstruction. Appendix within normal limits. No acute inflammatory changes seen about the bowels. Lymphatic: No adenopathy. Reproductive: Uterus is absent.  Ovaries not discretely identified. Other: No free air or fluid. Musculoskeletal: Compression deformity involving the T12 superior endplate is chronic in appearance. No acute osseous abnormality. No worrisome lytic or blastic  osseous lesions. IMPRESSION: VASCULAR 1. No acute vascular abnormality identified within the major arterial vasculature of the abdomen and pelvis. No evidence for dissection or thrombus. 2. Moderate atherosclerosis as above. NON-VASCULAR No other acute abnormality identified within the abdomen and pelvis. Electronically Signed   By: Jeannine Boga M.D.   On: 10/28/2016 19:18    ____________________________________________   PROCEDURES Procedures  ____________________________________________   INITIAL IMPRESSION / ASSESSMENT AND PLAN / ED COURSE  Pertinent labs & imaging results that were available during my care of the patient were reviewed by me and considered in my medical decision making (see chart for details).  Patient well appearing no acute distress, presents with symptoms consistent with vasovagal reaction, likely provoked by  GI symptoms. No symptoms to suggest cardiopulmonary pathology such as ACS PE dissection SVT and atrial fibrillation pericarditis or pneumothorax. Overall abdomen is benign and vital signs are back to normal and patient is feeling much better, but this episode raises concern for possible vascular event such as AAA dissection or mesenteric ischemia. We will get a CT angiogram of the abdomen to evaluate. If negative and think patient is suitable for outpatient follow-up, continuing her home medications.   ----------------------------------------- 8:19 PM on 10/28/2016 -----------------------------------------  Patient well appearing no acute distress at present time, continues to feel completely well. Being visited that multiple family members, in good spirits laughing and having a good time with her family. CT is negative for any acute pathology. Results discussed with the patient, follow-up with primary care. I had previously counseled her to check her loop recorder for any possible cardiac event that may have happened such as atrial fibrillation during that  time and to follow up with her electrophysiologist if she was not in sinus rhythm.     ____________________________________________   FINAL CLINICAL IMPRESSION(S) / ED DIAGNOSES  Final diagnoses:  Near syncope  Dizziness      New Prescriptions   No medications on file     Portions of this note were generated with dragon dictation software. Dictation errors may occur despite best attempts at proofreading.    Carrie Mew, MD 10/28/16 2019

## 2016-10-28 NOTE — ED Triage Notes (Signed)
Pt brought in by [facility] from work.  Pt stated that she was having some abdominal pain and went to have a BM, but instead had near syncopal episode on the toilet.  When EMS arrived her BP was 81/67, pt was cool, pale and diaphoretic.  Pt has a loop recorder placed by Dr. Allegra Lai at [facility] for runs of SVT and to monitor patient's history of afib.  Pt states that she did not feel like her heart was racing during this episode.  Per EMS, pt received 100cc NS bolus in route and SBP was up to 97.  Pt was Sinus brady per EMS.  EMS also reports that pt was slow to answer questions, but unsure if this is patient's baseline.  Pt is A&Ox4 in triage, still slow to answer some questions, but able to answer questions properly.

## 2016-10-29 ENCOUNTER — Telehealth: Payer: Self-pay | Admitting: Cardiology

## 2016-10-29 NOTE — Telephone Encounter (Signed)
New Message  Pt call requesting to speak with RN about her device. Pt states she went to the hospital and would like to discuss her device reading. Please call back to discuss

## 2016-10-29 NOTE — Telephone Encounter (Signed)
Spoke with pt, pt stated that she went to [facility] yesterday d/t near syncopal episode, per [facility] notes pt's BP was 80/60 IV fluids were given, pt has an implanted LINQ for AF. Pt sent in a manual transmission, last night after DC from ED, no alert conditions were met on transmission. Pt states that today she just feels washed out. Advised pt to stay hydrated and I would forward this onto Dr. Curt Bears and someone would be in contact if he has any further recommendations. Pt voiced understanding.

## 2016-10-30 ENCOUNTER — Telehealth: Payer: Self-pay | Admitting: Cardiology

## 2016-10-30 NOTE — Telephone Encounter (Signed)
°  New Prob  Requesting to speak to nurse regarding most recent ED visit. Please call.

## 2016-10-30 NOTE — Telephone Encounter (Signed)
Advised pt to f/u w/ her PCP as recent incident not r/t heart. Patient verbalized understanding and agreeable to plan.

## 2016-11-05 ENCOUNTER — Other Ambulatory Visit: Payer: Self-pay | Admitting: Cardiology

## 2016-11-05 NOTE — Telephone Encounter (Signed)
Pt saw Dr Curt Bears on 09/15/16. Wt was 81.8Kg. SCr 1.22 from last hosp visit 10/28/16, Age 66 yrs old. Will reorder Eliquis 5mg  BID.

## 2016-11-09 ENCOUNTER — Encounter: Payer: Self-pay | Admitting: Cardiology

## 2016-11-09 ENCOUNTER — Telehealth: Payer: Self-pay | Admitting: Cardiology

## 2016-11-09 DIAGNOSIS — I471 Supraventricular tachycardia: Secondary | ICD-10-CM

## 2016-11-09 MED ORDER — METOPROLOL TARTRATE 25 MG PO TABS: 25.0000 mg | ORAL_TABLET | Freq: Two times a day (BID) | ORAL | 11 refills | Status: DC

## 2016-11-09 NOTE — Telephone Encounter (Signed)
Reviewed ECGs with Dr. Curt Bears, who recommended increasing metoprolol tartrate to 25mg  BID.  Patient verbalizes understanding of recommendations.  Prescription sent electronically to patient's preferred pharmacy.  Patient agrees to monitor her BP and to call if she notes low BPs or HRs.  Patient is appreciative of call and verbalizes understanding of instructions to contact us with any additional questions or concerns.

## 2016-11-09 NOTE — Telephone Encounter (Signed)
New message   Pt call requesting to speak with RN about recent upload from loop recorder. Please call back to discuss

## 2016-11-09 NOTE — Telephone Encounter (Signed)
Spoke with patient.  She reports that this weekend she had episodes of lightheadedness and "heard heartbeats" that didn't feel like AF.  She almost went to the ambulance bay to have an EKG done, but decided against it since she has her ILR.  Symptom episode ECGs from ILR appear ?SVT, short duration.  Patient reports she is still taking her Eliquis, flecainide, and metoprolol tartrate as prescribed.  She reports that her BP is 122/54, which is "low for me."  Advised patient that I will review episodes with Dr. Curt Bears for recommendations and call her back later today.  She is agreeable to this plan and denies additional questions or concerns at this time.

## 2016-11-10 LAB — CUP PACEART REMOTE DEVICE CHECK
Date Time Interrogation Session: 20180718175939
MDC IDC PG IMPLANT DT: 20171120

## 2016-11-10 NOTE — Progress Notes (Signed)
Carelink summary report received. Battery status OK. Normal device function. No new symptom episodes, tachy episodes, brady, or pause episodes. No new AF episodes. Monthly summary reports and ROV/PRN 

## 2016-11-11 DIAGNOSIS — I10 Essential (primary) hypertension: Secondary | ICD-10-CM | POA: Diagnosis not present

## 2016-11-11 DIAGNOSIS — R55 Syncope and collapse: Secondary | ICD-10-CM | POA: Diagnosis not present

## 2016-11-11 DIAGNOSIS — I471 Supraventricular tachycardia: Secondary | ICD-10-CM | POA: Diagnosis not present

## 2016-11-27 ENCOUNTER — Ambulatory Visit (INDEPENDENT_AMBULATORY_CARE_PROVIDER_SITE_OTHER): Payer: PPO | Admitting: *Deleted

## 2016-11-27 ENCOUNTER — Telehealth: Payer: Self-pay | Admitting: Cardiology

## 2016-11-27 DIAGNOSIS — I471 Supraventricular tachycardia: Secondary | ICD-10-CM | POA: Diagnosis not present

## 2016-11-27 DIAGNOSIS — R002 Palpitations: Secondary | ICD-10-CM

## 2016-11-27 NOTE — Telephone Encounter (Signed)
New Message  Pt call requesting to speak with RN about getting help sending a transmission . Please call back to discuss

## 2016-11-27 NOTE — Telephone Encounter (Signed)
Transmission successfully received

## 2016-12-02 DIAGNOSIS — L820 Inflamed seborrheic keratosis: Secondary | ICD-10-CM | POA: Diagnosis not present

## 2016-12-02 DIAGNOSIS — L814 Other melanin hyperpigmentation: Secondary | ICD-10-CM | POA: Diagnosis not present

## 2016-12-02 DIAGNOSIS — D225 Melanocytic nevi of trunk: Secondary | ICD-10-CM | POA: Diagnosis not present

## 2016-12-04 LAB — CUP PACEART REMOTE DEVICE CHECK
Date Time Interrogation Session: 20180817180907
MDC IDC PG IMPLANT DT: 20171120

## 2016-12-04 NOTE — Progress Notes (Signed)
Loop recorder summary report 

## 2016-12-08 DIAGNOSIS — M436 Torticollis: Secondary | ICD-10-CM | POA: Diagnosis not present

## 2016-12-08 DIAGNOSIS — M47812 Spondylosis without myelopathy or radiculopathy, cervical region: Secondary | ICD-10-CM | POA: Diagnosis not present

## 2016-12-25 ENCOUNTER — Telehealth: Payer: Self-pay | Admitting: Cardiovascular Disease

## 2016-12-25 NOTE — Telephone Encounter (Signed)
Risk mainly on female of childbearing age due to hormon changes and potential effect on fetus.  Finasteride may be used in female patient for some skin conditions.  Potential side effect while taking finasteride includes hypotension, hormone changes, dizziness and mood changes.   Recommendation: 1. Monitor blood pressure daily for 2 weeks and report any significant changes.

## 2016-12-25 NOTE — Telephone Encounter (Signed)
Patient reports she accidentally switched her BP medication (Lopressor) with her husband's finasteride and has been taking it for several weeks instead. Reiterated to her RPH's information. She denies dizziness, mood swings, low blood pressure. She actually reports her BP and HR have been higher than usual.  Instructed patient to restart metoprolol. She will check her BP and HR daily a couple hours after she takes her morning medicines and call with results. She was grateful for call and agrees with treatment plan.

## 2016-12-25 NOTE — Telephone Encounter (Signed)
New message   Pt c/o medication issue:  1. Name of Medication: Finasteride 5MG   2. How are you currently taking this medication (dosage and times per day)? 5MG , 1 tablet twice per day  3. Are you having a reaction (difficulty breathing--STAT)? None yet - taken 89 tablets  4. What is your medication issue? Pt has accidentally been taking her husbands medication and has so far taken 89 tablets. She wants to know what dangers this could cause with her current condition and requests a call back from a nurse as soon as possible

## 2016-12-28 ENCOUNTER — Ambulatory Visit (INDEPENDENT_AMBULATORY_CARE_PROVIDER_SITE_OTHER): Payer: PPO | Admitting: *Deleted

## 2016-12-28 DIAGNOSIS — R002 Palpitations: Secondary | ICD-10-CM | POA: Diagnosis not present

## 2016-12-28 NOTE — Progress Notes (Signed)
Carelink Summary Report / Loop Recorder 

## 2016-12-29 LAB — CUP PACEART REMOTE DEVICE CHECK
Date Time Interrogation Session: 20180916201056
MDC IDC PG IMPLANT DT: 20171120

## 2017-01-13 ENCOUNTER — Telehealth: Payer: Self-pay | Admitting: Cardiology

## 2017-01-13 NOTE — Telephone Encounter (Signed)
Spoke w/ pt husband and requested that pt send a manual transmission b/c her home monitor has not updated in at least 14 days.   

## 2017-01-26 ENCOUNTER — Ambulatory Visit (INDEPENDENT_AMBULATORY_CARE_PROVIDER_SITE_OTHER): Payer: PPO | Admitting: *Deleted

## 2017-01-26 DIAGNOSIS — R002 Palpitations: Secondary | ICD-10-CM | POA: Diagnosis not present

## 2017-01-27 NOTE — Progress Notes (Signed)
Carelink Summary Report / Loop Recorder 

## 2017-01-29 LAB — CUP PACEART REMOTE DEVICE CHECK
Implantable Pulse Generator Implant Date: 20171120
MDC IDC SESS DTM: 20181016200944

## 2017-02-08 ENCOUNTER — Telehealth: Payer: Self-pay | Admitting: *Deleted

## 2017-02-08 NOTE — Telephone Encounter (Addendum)
Called pt this morning to follow up on incident that occurred Sunday (reported by friend who is employee in our office). On Sunday patient reports pain in her "upper stomach & xyphoid process"  that moved to her back when she layed down.  She also experienced diaphoresis & nausea during this occurrence.  When she woke up from her nap she was "weak/washed out". States she feels better this morning but pain is still "coming and going", but not as intense as yesterday. She denies SOB, radiating pain, dizziness, syncope and irregular hear beats/palpitations. She experienced a similar episode in July and went to [facility], but she did not have nausea with this episode. LINQ monitored reviewed by device clinic -- no findings on monitor. Patient's last nuclear study was in 2013.   Patient aware I will forward this to both Dr. Burt Knack & [facility], for one of them to advise/recommend. Patient aware I will return call once MD has reviewed.

## 2017-02-09 NOTE — Telephone Encounter (Signed)
Reiterated to patient that she needs evaluation, but to start with PCP as Linq was normal and symptoms are in the abdomen. She understands that if PCP thinks symptoms are cardiac, Cardiology will see her. She was grateful for call and agrees with treatment plan.

## 2017-02-09 NOTE — Telephone Encounter (Signed)
Needs clinical exam/evaluation - might start with PCP considering Linq eval normal and sx's start in abdomen and occur at rest. Would be happy to see if PCP thinks sx's are cardiac. thanks

## 2017-02-10 DIAGNOSIS — H402212 Chronic angle-closure glaucoma, right eye, moderate stage: Secondary | ICD-10-CM | POA: Diagnosis not present

## 2017-02-18 DIAGNOSIS — R1084 Generalized abdominal pain: Secondary | ICD-10-CM | POA: Diagnosis not present

## 2017-02-18 DIAGNOSIS — N390 Urinary tract infection, site not specified: Secondary | ICD-10-CM | POA: Diagnosis not present

## 2017-02-18 DIAGNOSIS — R1013 Epigastric pain: Secondary | ICD-10-CM | POA: Diagnosis not present

## 2017-02-25 ENCOUNTER — Ambulatory Visit (INDEPENDENT_AMBULATORY_CARE_PROVIDER_SITE_OTHER): Payer: PPO | Admitting: *Deleted

## 2017-02-25 DIAGNOSIS — R002 Palpitations: Secondary | ICD-10-CM | POA: Diagnosis not present

## 2017-02-26 NOTE — Progress Notes (Signed)
Carelink Summary Report / Loop Recorder 

## 2017-03-11 ENCOUNTER — Telehealth: Payer: Self-pay | Admitting: Cardiology

## 2017-03-11 NOTE — Telephone Encounter (Signed)
LMOVM requesting that pt send manual transmission b/c home monitor has not updated in at least 14 days.    

## 2017-03-15 LAB — CUP PACEART REMOTE DEVICE CHECK
Date Time Interrogation Session: 20181115204229
Implantable Pulse Generator Implant Date: 20171120

## 2017-03-17 ENCOUNTER — Encounter: Payer: Self-pay | Admitting: Cardiology

## 2017-03-23 DIAGNOSIS — K219 Gastro-esophageal reflux disease without esophagitis: Secondary | ICD-10-CM | POA: Diagnosis not present

## 2017-03-23 DIAGNOSIS — R1013 Epigastric pain: Secondary | ICD-10-CM | POA: Diagnosis not present

## 2017-03-26 ENCOUNTER — Encounter: Payer: Self-pay | Admitting: Cardiology

## 2017-03-29 ENCOUNTER — Ambulatory Visit (INDEPENDENT_AMBULATORY_CARE_PROVIDER_SITE_OTHER): Payer: PPO | Admitting: *Deleted

## 2017-03-29 DIAGNOSIS — R002 Palpitations: Secondary | ICD-10-CM

## 2017-03-29 NOTE — Progress Notes (Signed)
Carelink Summary Report / Loop Recorder 

## 2017-04-09 DIAGNOSIS — R1013 Epigastric pain: Secondary | ICD-10-CM | POA: Diagnosis not present

## 2017-04-14 LAB — CUP PACEART REMOTE DEVICE CHECK
Implantable Pulse Generator Implant Date: 20171120
MDC IDC SESS DTM: 20181215204047

## 2017-04-16 ENCOUNTER — Other Ambulatory Visit: Payer: Self-pay | Admitting: Cardiology

## 2017-04-20 ENCOUNTER — Telehealth: Payer: Self-pay | Admitting: Cardiology

## 2017-04-20 NOTE — Telephone Encounter (Signed)
LMOVM requesting that pt send manual transmission b/c home monitor has not updated in at least 14 days.    

## 2017-04-26 ENCOUNTER — Ambulatory Visit (INDEPENDENT_AMBULATORY_CARE_PROVIDER_SITE_OTHER): Payer: PPO | Admitting: *Deleted

## 2017-04-26 DIAGNOSIS — R002 Palpitations: Secondary | ICD-10-CM

## 2017-04-28 NOTE — Progress Notes (Signed)
Carelink Summary Report / Loop Recorder 

## 2017-05-04 LAB — CUP PACEART REMOTE DEVICE CHECK
Date Time Interrogation Session: 20190114231019
MDC IDC PG IMPLANT DT: 20171120

## 2017-05-26 ENCOUNTER — Ambulatory Visit (INDEPENDENT_AMBULATORY_CARE_PROVIDER_SITE_OTHER): Payer: PPO | Admitting: *Deleted

## 2017-05-26 ENCOUNTER — Telehealth: Payer: Self-pay | Admitting: Cardiology

## 2017-05-26 DIAGNOSIS — R002 Palpitations: Secondary | ICD-10-CM

## 2017-05-26 NOTE — Telephone Encounter (Signed)
Spoke w/ pt husband and requested that she send a manual transmission b/c her home monitor has not updated in at least 14 days.   

## 2017-05-27 NOTE — Progress Notes (Signed)
Carelink Summary Report / Loop Recorder 

## 2017-06-24 LAB — CUP PACEART REMOTE DEVICE CHECK
Implantable Pulse Generator Implant Date: 20171120
MDC IDC SESS DTM: 20190214013857

## 2017-06-28 ENCOUNTER — Ambulatory Visit (INDEPENDENT_AMBULATORY_CARE_PROVIDER_SITE_OTHER): Payer: PPO | Admitting: *Deleted

## 2017-06-28 DIAGNOSIS — R002 Palpitations: Secondary | ICD-10-CM

## 2017-06-29 NOTE — Progress Notes (Signed)
Carelink Summary Report / Loop Recorder 

## 2017-07-13 ENCOUNTER — Telehealth: Payer: Self-pay | Admitting: Cardiology

## 2017-07-13 NOTE — Telephone Encounter (Signed)
Spoke w/ pt husband and requested that she send a manual transmission b/c her home monitor has not updated in at least 14 days.   

## 2017-07-19 ENCOUNTER — Encounter: Payer: Self-pay | Admitting: Cardiology

## 2017-07-19 ENCOUNTER — Ambulatory Visit (INDEPENDENT_AMBULATORY_CARE_PROVIDER_SITE_OTHER): Payer: PPO | Admitting: Cardiology

## 2017-07-19 VITALS — BP 128/62 | HR 61 | Ht 68.0 in | Wt 189.0 lb

## 2017-07-19 DIAGNOSIS — I480 Paroxysmal atrial fibrillation: Secondary | ICD-10-CM | POA: Diagnosis not present

## 2017-07-19 DIAGNOSIS — I481 Persistent atrial fibrillation: Secondary | ICD-10-CM | POA: Diagnosis not present

## 2017-07-19 DIAGNOSIS — I10 Essential (primary) hypertension: Secondary | ICD-10-CM | POA: Diagnosis not present

## 2017-07-19 DIAGNOSIS — E785 Hyperlipidemia, unspecified: Secondary | ICD-10-CM

## 2017-07-19 DIAGNOSIS — E782 Mixed hyperlipidemia: Secondary | ICD-10-CM | POA: Insufficient documentation

## 2017-07-19 DIAGNOSIS — I4891 Unspecified atrial fibrillation: Secondary | ICD-10-CM | POA: Insufficient documentation

## 2017-07-19 DIAGNOSIS — M25551 Pain in right hip: Secondary | ICD-10-CM | POA: Diagnosis not present

## 2017-07-19 DIAGNOSIS — K219 Gastro-esophageal reflux disease without esophagitis: Secondary | ICD-10-CM | POA: Diagnosis not present

## 2017-07-19 MED ORDER — DILTIAZEM HCL ER COATED BEADS 120 MG PO CP24: 120.0000 mg | ORAL_CAPSULE | Freq: Every day | ORAL | 3 refills | Status: DC

## 2017-07-19 MED ORDER — FLECAINIDE ACETATE 100 MG PO TABS: 100.0000 mg | ORAL_TABLET | Freq: Two times a day (BID) | ORAL | 3 refills | Status: DC

## 2017-07-19 NOTE — Patient Instructions (Signed)
Medication Instructions:  Your physician has recommended you make the following change in your medication:  1. STOP Metoprolol Tartrate 2. START Diltiazem 120 mg once daily 3. INCREASE Flecainide to 100 mg twice daily  Labwork: None ordered  Testing/Procedures: None ordered  Follow-Up: Your physician recommends that you schedule a follow-up appointment in: 1 week for nurse visit EKG.  Please bring a list of blood pressures with you to this appointment.  Your physician recommends that you schedule a follow-up appointment in: 3 months with Dr. Curt Bears.   * If you need a refill on your cardiac medications before your next appointment, please call your pharmacy.   *Please note that any paperwork needing to be filled out by the provider will need to be addressed at the front desk prior to seeing the provider. Please note that any FMLA, disability or other documents regarding health condition is subject to a $25.00 charge that must be received prior to completion of paperwork in the form of a money order or check.  Thank you for choosing [facility]!!   Trinidad Curet, RN [phone]  Any Other Special Instructions Will Be Listed Below (If Applicable).  Diltiazem extended-release capsules or tablets What is this medicine? DILTIAZEM (dil TYE a zem) is a calcium-channel blocker. It affects the amount of calcium found in your heart and muscle cells. This relaxes your blood vessels, which can reduce the amount of work the heart has to do. This medicine is used to treat high blood pressure and chest pain caused by angina. This medicine may be used for other purposes; ask your health care provider or pharmacist if you have questions. COMMON BRAND NAME(S): Cardizem CD, Cardizem LA, Cardizem SR, Cartia XT, Dilacor XR, Dilt-CD, Diltia XT, Diltzac, Matzim LA, Rema Fendt, Tiamate, Tiazac What should I tell my health care provider before I take this medicine? They need to know if you have any of  these conditions: -heart problems, low blood pressure, irregular heartbeat -liver disease -previous heart attack -an unusual or allergic reaction to diltiazem, other medicines, foods, dyes, or preservatives -pregnant or trying to get pregnant -breast-feeding How should I use this medicine? Take this medicine by mouth with a glass of water. Follow the directions on the prescription label. Swallow whole, do not crush or chew. Ask your doctor or pharmacist if your should take this medicine with food. Take your doses at regular intervals. Do not take your medicine more often then directed. Do not stop taking except on the advice of your doctor or health care professional. Ask your doctor or health care professional how to gradually reduce the dose. Talk to your pediatrician regarding the use of this medicine in children. Special care may be needed. Overdosage: If you think you have taken too much of this medicine contact a poison control center or emergency room at once. NOTE: This medicine is only for you. Do not share this medicine with others. What if I miss a dose? If you miss a dose, take it as soon as you can. If it is almost time for your next dose, take only that dose. Do not take double or extra doses. What may interact with this medicine? Do not take this medicine with any of the following medications: -cisapride -hawthorn -pimozide -ranolazine -red yeast rice This medicine may also interact with the following medications: -buspirone -carbamazepine -cimetidine -cyclosporine -digoxin -local anesthetics or general anesthetics -lovastatin -medicines for anxiety or difficulty sleeping like midazolam and triazolam -medicines for high blood pressure or  heart problems -quinidine -rifampin, rifabutin, or rifapentine This list may not describe all possible interactions. Give your health care provider a list of all the medicines, herbs, non-prescription drugs, or dietary supplements you  use. Also tell them if you smoke, drink alcohol, or use illegal drugs. Some items may interact with your medicine. What should I watch for while using this medicine? Check your blood pressure and pulse rate regularly. Ask your doctor or health care professional what your blood pressure and pulse rate should be and when you should contact him or her. You may feel dizzy or lightheaded. Do not drive, use machinery, or do anything that needs mental alertness until you know how this medicine affects you. To reduce the risk of dizzy or fainting spells, do not sit or stand up quickly, especially if you are an older patient. Alcohol can make you more dizzy or increase flushing and rapid heartbeats. Avoid alcoholic drinks. What side effects may I notice from receiving this medicine? Side effects that you should report to your doctor or health care professional as soon as possible: -allergic reactions like skin rash, itching or hives, swelling of the face, lips, or tongue -confusion, mental depression -feeling faint or lightheaded, falls -redness, blistering, peeling or loosening of the skin, including inside the mouth -slow, irregular heartbeat -swelling of the feet and ankles -unusual bleeding or bruising, pinpoint red spots on the skin Side effects that usually do not require medical attention (report to your doctor or health care professional if they continue or are bothersome): -constipation or diarrhea -difficulty sleeping -facial flushing -headache -nausea, vomiting -sexual dysfunction -weak or tired This list may not describe all possible side effects. Call your doctor for medical advice about side effects. You may report side effects to FDA at 1-800-FDA-1088. Where should I keep my medicine? Keep out of the reach of children. Store at room temperature between 15 and 30 degrees C (59 and 86 degrees F). Protect from humidity. Throw away any unused medicine after the expiration date. NOTE: This  sheet is a summary. It may not cover all possible information. If you have questions about this medicine, talk to your doctor, pharmacist, or health care provider.  2018 Elsevier/Gold Standard (2007-07-21 14:35:47)

## 2017-07-19 NOTE — Progress Notes (Signed)
Electrophysiology Office Note   Date:  07/19/2017   ID:  Vanessa [name], DOB [birth year], MRN [id number]  PCP:  Myrlene Broker, MD  Cardiologist:  Burt Knack Primary Electrophysiologist:  Tito Ausmus [name], MD    Chief Complaint  Patient presents with  . Pacemaker Check    loop recorder/PAF     History of Present Illness: Vanessa [name] is a 67 y.o. female who presents today for electrophysiology evaluation.   History of exertional pain w/ neg MV 2013, FH CAD, HTN, HLD. Had a LINQ implanted for palpitations found to be in atrial fibrillation. She says that she has had a few further episodes of palpitations since last being seen. She has a 3% burden on her Linq interrogation.  Today, denies symptoms of palpitations, chest pain, shortness of breath, orthopnea, PND, lower extremity edema, claudication, dizziness, presyncope, syncope, bleeding, or neurologic sequela. The patient is tolerating medications without difficulties.  Overall she has been doing well though she has been having palpitations approximately twice a week.  They last for a few minutes at a time.  Linq interrogation shows episodes of SVT and PACs.  She also has been quite fatigued, finding it difficult to get any exercise lately.   Past Medical History:  Diagnosis Date  . Anxiety   . AR (allergic rhinitis)   . Atrial fibrillation ([place])   . Depressed   . Glaucoma   . HTN (hypertension)   . Overweight(278.02)    Past Surgical History:  Procedure Laterality Date  . ABDOMINAL HYSTERECTOMY    . CATARACT EXTRACTION    . catherization  11/00   normal  . cyrotherapy    . EP IMPLANTABLE DEVICE N/A 03/02/2016   Procedure: Loop Recorder Insertion;  Surgeon: Mikhai Bienvenue [name], MD;  Location: [place] [facility];  Service: Cardiovascular;  Laterality: N/A;  . EYE SURGERY     x2  . GLAUCOMA SURGERY     bilateral  . WISDOM TOOTH EXTRACTION       Current Outpatient Medications  Medication Sig Dispense Refill  .  ELIQUIS 5 MG TABS tablet TAKE ONE TABLET BY MOUTH TWICE [name] 180 tablet 3  . Garlic (GARLIQUE PO) Take 1 tablet by mouth [name].    Marland Kitchen losartan-hydrochlorothiazide (HYZAAR) 100-25 MG per tablet Take 1 tablet by mouth [name].     . Multiple Vitamins-Minerals (OCUVITE PO) Take 1 tablet by mouth [name].    Marland Kitchen NEXIUM 40 MG capsule Take 40 mg by mouth [name] before breakfast.     . ranitidine (ZANTAC) 300 MG capsule Take 300 mg by mouth [name].     No current facility-administered medications for this visit.     Allergies:   Codeine   Social History:  The patient  reports that she has never smoked. She has never used smokeless tobacco. She reports that she does not drink alcohol or use drugs.   Family History:  The patient's family history includes Heart attack in her father; Stroke in her mother.   ROS:  Please see the history of present illness.   Otherwise, review of systems is positive for chills, fatigue.   All other systems are reviewed and negative.   PHYSICAL EXAM: VS:  BP 128/62   Pulse 61   Ht 5\' 8"  (1.727 m)   Wt 189 lb (85.7 kg)   SpO2 95%   BMI 28.74 kg/m  , BMI Body mass index is 28.74 kg/m. GEN: Well nourished, well developed, in no acute distress  HEENT: normal  Neck: no JVD, carotid bruits, or masses Cardiac: RRR; no murmurs, rubs, or gallops,no edema  Respiratory:  clear to auscultation bilaterally, normal work of breathing GI: soft, nontender, nondistended, + BS MS: no deformity or atrophy  Skin: warm and dry, device site well healed Neuro:  Strength and sensation are intact Psych: euthymic mood, full affect  EKG:  EKG is ordered today. Personal review of the ekg ordered shows sinus rhythm, rate 61  Personal review of the device interrogation today. Results in [place]: 10/28/2016: ALT 12; BUN 21; Creatinine, Ser 1.22; Hemoglobin 12.8; Platelets 202; Potassium 4.1; Sodium 138    Lipid Panel  No results found for: CHOL, TRIG, HDL, CHOLHDL, VLDL,  LDLCALC, LDLDIRECT   Wt Readings from Last 3 Encounters:  07/19/17 189 lb (85.7 kg)  10/28/16 177 lb (80.3 kg)  09/15/16 180 lb 6.4 oz (81.8 kg)      Other studies Reviewed: Additional studies/ records that were reviewed today include: TTE 02/19/16  Review of the above records today demonstrates:  - Left ventricle: The cavity size was normal. There was mild focal   basal hypertrophy of the septum. Systolic function was normal.   The estimated ejection fraction was in the range of 55% to 60%.   Wall motion was normal; there were no regional wall motion   abnormalities. Doppler parameters are consistent with abnormal   left ventricular relaxation (grade 1 diastolic dysfunction). - Mitral valve: There was mild regurgitation. - Left atrium: The atrium was mildly dilated.  ASSESSMENT AND PLAN:  1.  Paroxysmal atrial fibrillation: Currently on Eliquis.  No atrial fibrillation found on device interrogation.  She has been having quite a bit of PACs that give her symptoms.  We Preeya Cleckley thus increase her flecainide to 100 mg.  She is also having episodes of fatigue.  We Dayvion Sans stop her metoprolol and start her on diltiazem 120 mg.  I have instructed her to take this at night as her heart rate has been low during the day.  We Carr Shartzer plan for an echocardiogram after 1 week of increased flecainide.  This patients CHA2DS2-VASc Score and unadjusted Ischemic Stroke Rate (% per year) is equal to 3.2 % stroke rate/year from a score of 3  Above score calculated as 1 point each if present [CHF, HTN, DM, Vascular=MI/PAD/Aortic Plaque, Age if 65-74, or Female] Above score calculated as 2 points each if present [Age > 75, or Stroke/TIA/TE]   2. Hypertension: Well-controlled today.  She does say that her blood pressure cuff at home is reading systolics in the [address].  She Richetta Cubillos bring her cuff in to check it against ours in 1 week.   Current medicines are reviewed at length with the patient today.   The patient  does not have concerns regarding her medicines.  The following changes were made today: Stop metoprolol, start diltiazem, increase flecainide  Labs/ tests ordered today include:  Orders Placed This Encounter  Procedures  . EKG 12-Lead     Disposition:   FU with Gustava Berland 3 months  Signed, Camauri Craton [name], MD  07/19/2017 3:46 PM     [address] [place] [address] [phone] (office) [phone] (fax)

## 2017-07-20 LAB — CUP PACEART INCLINIC DEVICE CHECK
Date Time Interrogation Session: 20190409165139
Implantable Pulse Generator Implant Date: 20171120

## 2017-07-22 ENCOUNTER — Ambulatory Visit
Admission: RE | Admit: 2017-07-22 | Discharge: 2017-07-22 | Disposition: A | Discharge status: Home / Self Care | Payer: Worker's Compensation | Source: Ambulatory Visit | Attending: Physician Assistant | Admitting: Physician Assistant

## 2017-07-22 ENCOUNTER — Other Ambulatory Visit: Payer: Self-pay | Admitting: Physician Assistant

## 2017-07-22 DIAGNOSIS — M19032 Primary osteoarthritis, left wrist: Secondary | ICD-10-CM | POA: Insufficient documentation

## 2017-07-22 DIAGNOSIS — R52 Pain, unspecified: Secondary | ICD-10-CM | POA: Diagnosis not present

## 2017-07-22 DIAGNOSIS — M19042 Primary osteoarthritis, left hand: Secondary | ICD-10-CM | POA: Insufficient documentation

## 2017-07-22 DIAGNOSIS — M19041 Primary osteoarthritis, right hand: Secondary | ICD-10-CM | POA: Diagnosis not present

## 2017-07-27 ENCOUNTER — Ambulatory Visit (INDEPENDENT_AMBULATORY_CARE_PROVIDER_SITE_OTHER): Payer: PPO

## 2017-07-27 VITALS — BP 190/80 | HR 63 | Ht 68.0 in | Wt 193.0 lb

## 2017-07-27 DIAGNOSIS — I480 Paroxysmal atrial fibrillation: Secondary | ICD-10-CM

## 2017-07-27 DIAGNOSIS — Z79899 Other long term (current) drug therapy: Secondary | ICD-10-CM

## 2017-07-27 DIAGNOSIS — I10 Essential (primary) hypertension: Secondary | ICD-10-CM | POA: Diagnosis not present

## 2017-07-27 MED ORDER — HYDRALAZINE HCL 25 MG PO TABS: 25.0000 mg | ORAL_TABLET | Freq: Three times a day (TID) | ORAL | 3 refills | Status: DC

## 2017-07-27 NOTE — Progress Notes (Signed)
1.) Reason for visit: EKG due to medication changes.  2.) Name of MD requesting visit: Dr. Curt Bears  3.) H&P: Patient has history of PAF, CAD, HTN, and HLD. At last office visit Dr. Curt Bears stopped Metoprolol and started patient on Diltiazem 120 mg daily and increased patient's flecainide to 100 mg BID. Patient coming in after being on increased dose of Flecainide for one week. Patient is also keeping track of her BP's at home and brought in her BP machine from home to compare. Patient's BP reading from home-   4/09 137/58   58  4/10 152/57   64  4/11 156/56   63  4/13 166/61   58  4/14 150/66   59  4/16 192/76   63    4.) ROS related to problem: Today patient's BP was 190/80 (manual) HR 63. Checked BP with patient's home monitor BP 163/64. Patient reported she has not taken any of her medications today. Patient's weight is 193 lbs and height 5'8". Patient stated all her reading from home are taken before her morning medications. EKG was done and will be scanned into chart.   5.) Assessment and plan per MD: Consulted Dr. Acie Fredrickson, DOD, he recommend patient start Hydralazine 25 mg TID and to follow up as scheduled with Dr. Curt Bears. Patient was not sure if she wanted to start taking another medication. Patient is concerned about BP being too low. Informed patient about our blood pressure clinic. Patient was agreeable to see BP clinic. Encouraged patient to take BP reading two hours after she takes her medication, and keep a record. Patient wanted to know Dr. Curt Bears and Dr. Antionette Char advisement on taking Hydralazine. Will forward nurse visit note to Dr. Curt Bears and Dr. Burt Knack.

## 2017-07-27 NOTE — Patient Instructions (Addendum)
Medication Instructions:  Your physician has recommended you make the following change in your medication:  1-START Hydralazine 25 mg by mouth three times daily.   Labwork: NONE  Testing/Procedures: NONE  Follow-Up: Your physician wants you to follow-up as scheduled with Dr. Curt Bears.  Your physician recommends that you schedule a follow-up appointment for Blood Pressure Clinic.    If you need a refill on your cardiac medications before your next appointment, please call your pharmacy.

## 2017-07-28 ENCOUNTER — Telehealth: Payer: Self-pay | Admitting: *Deleted

## 2017-07-28 ENCOUNTER — Telehealth: Payer: Self-pay | Admitting: Cardiology

## 2017-07-28 NOTE — Telephone Encounter (Signed)
Michaelyn Barter, RN at 07/27/2017 2:00 PM   Status: Signed    1. Reason for visit: EKG due to medication changes.  2. Name of MD requesting visit: Dr. Curt Bears  3. H&P: Patient has history of PAF, CAD, HTN, and HLD. At last office visit Dr. Curt Bears stopped Metoprolol and started patient on Diltiazem 120 mg daily and increased patient's flecainide to 100 mg BID. Patient coming in after being on increased dose of Flecainide for one week. Patient is also keeping track of her BP's at home and brought in her BP machine from home to compare. Patient's BP reading from home-              4/09     137/58   58             4/10     152/57   64             4/11     156/56   63             4/13     166/61   58             4/14     150/66   59             4/16     192/76   63                    4. ROS related to problem: Today patient's BP was 190/80 (manual) HR 63. Checked BP with patient's home monitor BP 163/64. Patient reported she has not taken any of her medications today. Patient's weight is 193 lbs and height 5'8". Patient stated all her reading from home are taken before her morning medications. EKG was done and will be scanned into chart.   5. Assessment and plan per MD: Consulted Dr. Acie Fredrickson, DOD, he recommend patient start Hydralazine 25 mg TID and to follow up as scheduled with Dr. Curt Bears. Patient was not sure if she wanted to start taking another medication. Patient is concerned about BP being too low. Informed patient about our blood pressure clinic. Patient was agreeable to see BP clinic. Encouraged patient to take BP reading two hours after she takes her medication, and keep a record. Patient wanted to know Dr. Curt Bears and Dr. Antionette Char advisement on taking Hydralazine. Will forward nurse visit note to Dr. Curt Bears and Dr. Burt Knack.

## 2017-07-28 NOTE — Telephone Encounter (Signed)
LMOVM requesting that pt send manual transmission b/c home monitor has not updated in at least 14 days.    

## 2017-07-28 NOTE — Telephone Encounter (Signed)
lmtcb

## 2017-07-28 NOTE — Telephone Encounter (Signed)
-----   Message from Will [name], MD sent at 07/28/2017  9:13 AM EDT ----- Agree with hydralazine and referral to HTN clinic. ----- Message ----- From: Michaelyn Barter, RN Sent: 07/27/2017   3:31 PM To: Will [name], MD

## 2017-07-29 NOTE — Telephone Encounter (Signed)
Follow up    Patient is returning call. Please call [phone].

## 2017-07-29 NOTE — Telephone Encounter (Signed)
Called to inform pt that Dr. Curt Bears agreeable to [place] recommendation. Pt not receptive to that recommendation. She states that she told the RN that she had not taken her BP medication that day and she isn't sure that her cuff is reading correctly.  She was seen on Tuesday and had not had any BP medication since 6am the day before ("and told nurse this"). Pt would prefer to monitor her BPs over the next several days with a new wrist monitor before starting another medication.  Informed patient that I would follow up with her next week to get readings and re-assess need for starting medication. Pt is appreciative and agreeable to plan.

## 2017-08-02 ENCOUNTER — Other Ambulatory Visit: Payer: Self-pay | Admitting: Physician Assistant

## 2017-08-02 ENCOUNTER — Ambulatory Visit (INDEPENDENT_AMBULATORY_CARE_PROVIDER_SITE_OTHER): Payer: PPO | Admitting: *Deleted

## 2017-08-02 ENCOUNTER — Ambulatory Visit
Admission: RE | Admit: 2017-08-02 | Discharge: 2017-08-02 | Disposition: A | Discharge status: Home / Self Care | Payer: Worker's Compensation | Source: Ambulatory Visit | Attending: Physician Assistant | Admitting: Physician Assistant

## 2017-08-02 DIAGNOSIS — M25531 Pain in right wrist: Secondary | ICD-10-CM

## 2017-08-02 DIAGNOSIS — M19031 Primary osteoarthritis, right wrist: Secondary | ICD-10-CM | POA: Insufficient documentation

## 2017-08-02 DIAGNOSIS — R002 Palpitations: Secondary | ICD-10-CM | POA: Diagnosis not present

## 2017-08-02 NOTE — Progress Notes (Signed)
Carelink Summary Report / Loop Recorder 

## 2017-08-03 NOTE — Telephone Encounter (Signed)
Pt reports SBP avg > 130s all weekend. reviewed w/ Dr. Curt Bears. Removed Hydralazine from pt medication list -- advised pt not to start. Advised to call office back if SBP stays above 140. Patient appreciative of my help in this matter. Patient verbalized understanding and agreeable to plan.

## 2017-08-05 ENCOUNTER — Ambulatory Visit (INDEPENDENT_AMBULATORY_CARE_PROVIDER_SITE_OTHER): Payer: PPO | Admitting: Pharmacist

## 2017-08-05 VITALS — BP 160/64 | HR 58

## 2017-08-05 DIAGNOSIS — Z8249 Family history of ischemic heart disease and other diseases of the circulatory system: Secondary | ICD-10-CM | POA: Insufficient documentation

## 2017-08-05 DIAGNOSIS — E782 Mixed hyperlipidemia: Secondary | ICD-10-CM

## 2017-08-05 DIAGNOSIS — I10 Essential (primary) hypertension: Secondary | ICD-10-CM

## 2017-08-05 NOTE — Patient Instructions (Signed)
It was nice to see you today  Continue your current medications  Monitor your blood pressure in each arm and record your readings  Bring your readings and home blood pressure cuff to your follow up visit in the blood pressure clinic  We will check your cholesterol at your follow up appointment as well

## 2017-08-05 NOTE — Progress Notes (Addendum)
Patient ID: Vanessa [name]                 DOB: [birth year]                      MRN: [id number]     HPI: Vanessa [name] is a 67 y.o. female referred by Dr. Curt Bears to HTN clinic. PMH is significant for HTN, HLD, afib, family history of CAD, and anxiety/depression. She was recently advised to start hydralazine 25mg  TID after manual BP check in clinic on 4/16 was 190/80. Pt did not want to start hydralazine since she had not taken her BP medications yet when her BP was measured that high. She was advised to continue to monitor her BP and presents today for further management.  Patient presents today in good spirits. She reports her BP readings at home have ranged 409 - 811 systolic. She felt a bit weak when her systolic reading was 914 yesterday. Earlier this week, her systolic BP was 782 and HR was 53 at [facility]. She uses a wrist cuff to check her blood pressure. Pt has been under more stress recently with recent passing of her mother. She has been eating more meals at restaurants lately due to being busy. She is still working full time but plans to retire soon, and is looking forward to becoming more active at that time. She mostly uses Tylenol for pain, however does take naproxen occasionally. She is aware to limit NSAIDs due to increased risk for GI bleed on concomitant blood thinner and potential to raise BP.  Current HTN meds: losartan-HCTZ 100-25mg  daily, diltiazem 120mg  daily Previously tried: metoprolol - fatigue BP goal: <130/9mmHg  Family History: Mother with history of stroke, father with history of MI at age 23, paternal grandfather passed at 74 from an MI  Social History: Denies alcohol, tobacco, or illicit drug use.   Diet: [place] with bacon and cheese this morning, typically has cereal. Lunch - Japanese steak,chicken, and rice + Ensure. Generally eats more meals out due to busy schedule. Likes [employer] - fried chicken strips and fries. 2 cups of caffeinated tea  each day.  Exercise: Minimal.   Wt Readings from Last 3 Encounters:  07/27/17 193 lb (87.5 kg)  07/19/17 189 lb (85.7 kg)  10/28/16 177 lb (80.3 kg)   BP Readings from Last 3 Encounters:  07/27/17 (!) 190/80  07/19/17 128/62  10/28/16 (!) 166/67   Pulse Readings from Last 3 Encounters:  07/27/17 63  07/19/17 61  10/28/16 61    Renal function: CrCl cannot be calculated (Patient's most recent lab result is older than the maximum 21 days allowed.).  Past Medical History:  Diagnosis Date  . Anxiety   . AR (allergic rhinitis)   . Atrial fibrillation ([place])   . Depressed   . Glaucoma   . HTN (hypertension)   . Overweight(278.02)     Current Outpatient Medications on File Prior to Visit  Medication Sig Dispense Refill  . diltiazem (CARDIZEM CD) 120 MG 24 hr capsule Take 1 capsule (120 mg total) by mouth daily. 90 capsule 3  . ELIQUIS 5 MG TABS tablet TAKE ONE TABLET BY MOUTH TWICE DAILY 180 tablet 3  . flecainide (TAMBOCOR) 100 MG tablet Take 1 tablet (100 mg total) by mouth 2 (two) times daily. 180 tablet 3  . Garlic (GARLIQUE PO) Take 1 tablet by mouth daily.    Marland Kitchen losartan-hydrochlorothiazide (HYZAAR) 100-25 MG per tablet Take 1 tablet  by mouth daily.     . Multiple Vitamins-Minerals (OCUVITE PO) Take 1 tablet by mouth daily.    Marland Kitchen NEXIUM 40 MG capsule Take 40 mg by mouth daily before breakfast.     . ranitidine (ZANTAC) 300 MG capsule Take 300 mg by mouth daily.     No current facility-administered medications on file prior to visit.     Allergies  Allergen Reactions  . Codeine Nausea Only    nasuea     Assessment/Plan:  1. Hypertension - Notable difference in blood pressure between right arm at 130/64 and left arm at 160/64, checked multiple times manually. Patient has a strong family history of CAD (father with MI at 78 and grandfather deceased at 51 from MI). Discussed further screening including calcium score and advanced lipid panel as plaque build up can  contribute to variation in BP between arms. She is willing to have an advanced lipid panel checked but defers calcium scoring for now. She is hesitant to start any new medication for blood pressure or cholesterol. Will f/u with patient in 3 weeks for BP check and will check fasting NMR lipid panel with ApoB and Lp(a) at that time. Advised pt to monitor and record her BP in each arm and to bring home readings and BP cuff to follow up visit. Discussed room for improvement in diet as well, including eating more meals at home and decreasing fast food.   Megan E. Supple, PharmD, CPP, [place] [address], [place], [place] [address] Phone: [phone]; Fax: [phone] 08/05/2017 4:48 PM

## 2017-08-06 LAB — CUP PACEART REMOTE DEVICE CHECK
Implantable Pulse Generator Implant Date: 20171120
MDC IDC SESS DTM: 20190319020540

## 2017-08-06 NOTE — Progress Notes (Signed)
I think this is a reasonable choice and agree with BP Clinic referral. thx

## 2017-08-17 ENCOUNTER — Telehealth: Payer: Self-pay | Admitting: Cardiology

## 2017-08-17 NOTE — Telephone Encounter (Signed)
LMOVM requesting that pt send manual transmission b/c home monitor has not updated in at least 14 days.    

## 2017-08-26 ENCOUNTER — Other Ambulatory Visit: Payer: PPO | Admitting: *Deleted

## 2017-08-26 ENCOUNTER — Ambulatory Visit (INDEPENDENT_AMBULATORY_CARE_PROVIDER_SITE_OTHER): Payer: PPO | Admitting: Pharmacist

## 2017-08-26 VITALS — BP 158/74 | HR 54

## 2017-08-26 DIAGNOSIS — Z8249 Family history of ischemic heart disease and other diseases of the circulatory system: Secondary | ICD-10-CM | POA: Diagnosis not present

## 2017-08-26 DIAGNOSIS — E782 Mixed hyperlipidemia: Secondary | ICD-10-CM

## 2017-08-26 DIAGNOSIS — I10 Essential (primary) hypertension: Secondary | ICD-10-CM

## 2017-08-26 NOTE — Patient Instructions (Signed)
It was nice to see you today  Continue taking your blood pressure medications  We will check an advanced lipid panel today  Please consider a coronary calcium test - this well help [place] determine how high your risk for developing cardiovascular disease is since it can detect plaque build up in your vessels.

## 2017-08-26 NOTE — Progress Notes (Signed)
Patient ID: Vanessa [name]                 DOB: October 03, 1950                      MRN: [id number]     HPI: Vanessa [name] is a 67 y.o. female referred by Dr. Curt Bears to HTN clinic. PMH is significant for HTN, HLD, afib, family history of CAD, and anxiety/depression. She was recently advised to start hydralazine 25mg  TID after manual BP check in clinic on 4/16 was 190/80. Pt did not want to start hydralazine since she had not taken her BP medications yet. At last HTN visit, there was a notable difference in blood pressure between right arm at 130/64 and left arm at 160/64, checked multiple times manually. Patient has a strong family history of CAD (father with MI at 57 and grandfather deceased at 65 from MI). Discussed further screening including calcium score and advanced lipid panel as plaque build up can contribute to variation in BP between arms. She was willing to have an advanced lipid panel checked but deferred calcium scoring. She was hesitant to start any new medication for blood pressure or cholesterol.  Pt presents today in good spirits. She denies dizziness, blurred vision, falls, or headaches. She has been checking her BP at home in each arm as directed. There continues to be a 15-57mmHg difference in systolic BP readings between her left and right arms. Average readings in her right arm are lower at 100-120/50-60s, while readings in her left arm are higher at 120-140s/50-60s. HR stable in the 50s. She took her medications 1.5 hours ago this morning.  BP is higher in clinic today - pt reports stress this morning with her drive to clinic due to traffic. She also has hip and back pain that limit her mobility. Pt has been under more stress recently with recent passing of her mother. She has been eating more meals at restaurants lately due to being busy. She has been trying to eat more vegetables and limit fried foods. She is still working full time but plans to retire soon, and is looking forward to  becoming more active at that time. She mostly uses Tylenol for pain, however does take naproxen occasionally. She is aware to limit NSAIDs due to increased risk for GI bleed on concomitant blood thinner and potential to raise BP.  Clinic cuff readings: R arm: 130/72, then 122/76 L arm: 162/68, then 158/74  Home cuff readings: R arm: 133/58 L arm: 168/68  Current HTN meds: losartan-HCTZ 100-25mg  daily, diltiazem 120mg  daily Previously tried: metoprolol - fatigue BP goal: <130/52mmHg  Family History: Mother with history of stroke, father with history of MI at age 76, paternal grandfather passed at 36 from an MI  Social History: Denies alcohol, tobacco, or illicit drug use.   Diet: [place] with bacon and cheese this morning, typically has cereal. Lunch - Japanese steak,chicken, and rice + Ensure. Generally eats more meals out due to busy schedule. Likes [employer] - fried chicken strips and fries. 2 cups of caffeinated tea each day.  Exercise: Minimal, limited due to hip pain.  Home BP readings: L arm: 120-140s/50-60s. R arm: 100-120s/50-60s  Wt Readings from Last 3 Encounters:  07/27/17 193 lb (87.5 kg)  07/19/17 189 lb (85.7 kg)  10/28/16 177 lb (80.3 kg)   BP Readings from Last 3 Encounters:  08/05/17 (!) 160/64  07/27/17 (!) 190/80  07/19/17 128/62   Pulse Readings from  Last 3 Encounters:  08/05/17 (!) 58  07/27/17 63  07/19/17 61    Renal function: CrCl cannot be calculated (Patient's most recent lab result is older than the maximum 21 days allowed.).  Past Medical History:  Diagnosis Date  . Anxiety   . AR (allergic rhinitis)   . Atrial fibrillation ([place])   . Depressed   . Glaucoma   . HTN (hypertension)   . Overweight(278.02)     Current Outpatient Medications on File Prior to Visit  Medication Sig Dispense Refill  . diltiazem (CARDIZEM CD) 120 MG 24 hr capsule Take 1 capsule (120 mg total) by mouth daily. 90 capsule 3  . ELIQUIS 5 MG TABS tablet TAKE  ONE TABLET BY MOUTH TWICE DAILY 180 tablet 3  . flecainide (TAMBOCOR) 100 MG tablet Take 1 tablet (100 mg total) by mouth 2 (two) times daily. 180 tablet 3  . Garlic (GARLIQUE PO) Take 1 tablet by mouth daily.    Marland Kitchen losartan-hydrochlorothiazide (HYZAAR) 100-25 MG per tablet Take 1 tablet by mouth daily.     . Multiple Vitamins-Minerals (OCUVITE PO) Take 1 tablet by mouth daily.    Marland Kitchen NEXIUM 40 MG capsule Take 40 mg by mouth daily before breakfast.     . ranitidine (ZANTAC) 300 MG capsule Take 300 mg by mouth daily.     No current facility-administered medications on file prior to visit.     Allergies  Allergen Reactions  . Codeine Nausea Only    nasuea     Assessment/Plan:  1. Hypertension - Home BP readings are consistently lower than clinic readings today, and home BP cuff's accuracy was verified during visit today. Home BP readings are well controlled with all readings in her right arm at goal <130/35mmHg and at least half of readings in her left arm at goal. Systolic BP in her left arm remains 15-59mmHg higher than her right arm. BP today 122/76 in her right arm, 158/74 in her left arm, both readings improved when checked at the end of her visit today. Will continue current HTN regimen and f/u in HTN clinic as needed. Encouraged pt to limit fast food and sodium intake.  2. CAD screening - Again discussed with pt today that discrepancy in BP readings may be due to peripheral plaque build up. She has a strong family history of heart disease and needs to be screened for CAD. She is agreeable to advanced lipid panel which we are checking today. Discussed benefits of calcium scoring again today with pt however she wishes to research this further. Will call pt with lipid panel results and encourage pt to undergo calcium scoring again at that time. She has been very hesitant to start any new medications but may benefit from statin therapy pending results.   Glena Pharris E. Syenna Nazir, PharmD, CPP,  [facility] [address] [place], [place] [address] Phone: [phone]; Fax: [phone] 08/26/2017 7:10 AM

## 2017-08-30 LAB — CUP PACEART REMOTE DEVICE CHECK
Implantable Pulse Generator Implant Date: 20171120
MDC IDC SESS DTM: 20190421020906

## 2017-08-31 ENCOUNTER — Telehealth: Payer: Self-pay | Admitting: Pharmacist

## 2017-08-31 DIAGNOSIS — E782 Mixed hyperlipidemia: Secondary | ICD-10-CM

## 2017-08-31 LAB — NMR, LIPOPROFILE
CHOLESTEROL, TOTAL: 196 mg/dL (ref 100–199)
HDL Particle Number: 31.8 umol/L (ref >=30.5)
HDL-C: 38 mg/dL — AB (ref >39)
LDL PARTICLE NUMBER: 1192 nmol/L — AB (ref <1000)
LDL Size: 19.7 nm — ABNORMAL LOW (ref >20.5)
LDL-C: 128 mg/dL — ABNORMAL HIGH (ref 0–99)
LP-IR Score: 74 — ABNORMAL HIGH (ref <=45)
Small LDL Particle Number: 767 nmol/L — ABNORMAL HIGH (ref <=527)
TRIGLYCERIDES: 150 mg/dL — AB (ref 0–149)

## 2017-08-31 LAB — BASIC METABOLIC PANEL
BUN / CREAT RATIO: 20 (ref 12–28)
BUN: 20 mg/dL (ref 8–27)
CHLORIDE: 99 mmol/L (ref 96–106)
CO2: 25 mmol/L (ref 20–29)
CREATININE: 1 mg/dL (ref 0.57–1.00)
Calcium: 10 mg/dL (ref 8.7–10.3)
GFR calc Af Amer: 67 mL/min/{1.73_m2} (ref >59)
GFR calc non Af Amer: 58 mL/min/{1.73_m2} — ABNORMAL LOW (ref >59)
GLUCOSE: 104 mg/dL — AB (ref 65–99)
Potassium: 4.6 mmol/L (ref 3.5–5.2)
Sodium: 138 mmol/L (ref 134–144)

## 2017-08-31 LAB — LIPOPROTEIN A (LPA): LIPOPROTEIN (A): 199 nmol/L — AB (ref <75)

## 2017-08-31 LAB — APOLIPOPROTEIN B: Apolipoprotein B: 106 mg/dL — ABNORMAL HIGH (ref <90)

## 2017-08-31 MED ORDER — ROSUVASTATIN CALCIUM 10 MG PO TABS: 10.0000 mg | ORAL_TABLET | Freq: Every day | ORAL | 11 refills | Status: DC

## 2017-08-31 NOTE — Addendum Note (Signed)
Addended by: Sylvi Rybolt E on: 08/31/2017 09:24 AM   Modules accepted: Orders

## 2017-08-31 NOTE — Telephone Encounter (Signed)
Discussed lipid panel results with pt. LDL 128 above goal < 100 due to family history of CAD. Lp(a) also very elevated indicating pt has more high risk LDL particles. Discussed starting statin therapy which pt has been hesitant to do in the past. She is agreeable to trying Crestor 10mg  daily. Scheduled lipids in 3 months.   Pt is also now willing to undergo calcium scoring. Order has been placed. Will route message to scheduling as pt would like coronary calcium score scheduled before her office visit with Dr Curt Bears (10/13/17 at 4pm).

## 2017-09-01 ENCOUNTER — Telehealth: Payer: Self-pay | Admitting: Cardiology

## 2017-09-01 NOTE — Telephone Encounter (Signed)
LMOVM requesting that pt send manual transmission b/c home monitor has not updated in at least 14 days.    

## 2017-09-02 ENCOUNTER — Ambulatory Visit (INDEPENDENT_AMBULATORY_CARE_PROVIDER_SITE_OTHER): Payer: PPO | Admitting: *Deleted

## 2017-09-02 DIAGNOSIS — R002 Palpitations: Secondary | ICD-10-CM

## 2017-09-03 NOTE — Progress Notes (Signed)
Carelink Summary Report / Loop Recorder 

## 2017-09-16 ENCOUNTER — Telehealth: Payer: Self-pay | Admitting: Cardiology

## 2017-09-16 NOTE — Telephone Encounter (Signed)
Spoke w/ pt husband and requested that she send a manual transmission b/c her home monitor has not updated in at least 14 days.   

## 2017-09-27 LAB — CUP PACEART REMOTE DEVICE CHECK
Implantable Pulse Generator Implant Date: 20171120
MDC IDC SESS DTM: 20190524023805

## 2017-10-04 ENCOUNTER — Ambulatory Visit (INDEPENDENT_AMBULATORY_CARE_PROVIDER_SITE_OTHER): Payer: PPO | Admitting: *Deleted

## 2017-10-04 DIAGNOSIS — R002 Palpitations: Secondary | ICD-10-CM

## 2017-10-06 NOTE — Progress Notes (Signed)
Carelink Summary Report / Loop Recorder 

## 2017-10-13 ENCOUNTER — Encounter: Payer: PPO | Admitting: Cardiology

## 2017-10-13 ENCOUNTER — Ambulatory Visit (INDEPENDENT_AMBULATORY_CARE_PROVIDER_SITE_OTHER)
Admission: RE | Admit: 2017-10-13 | Discharge: 2017-10-13 | Disposition: A | Discharge status: Home / Self Care | Payer: PPO | Source: Ambulatory Visit | Attending: Cardiology | Admitting: Cardiology

## 2017-10-13 DIAGNOSIS — E782 Mixed hyperlipidemia: Secondary | ICD-10-CM

## 2017-10-20 ENCOUNTER — Telehealth: Payer: Self-pay | Admitting: Cardiology

## 2017-10-20 NOTE — Telephone Encounter (Signed)
New Message:       Pt is returning a call for her CT Scoring test

## 2017-10-20 NOTE — Telephone Encounter (Signed)
Pt called to find out about the CT scoring test results. Pt was made aware that Dr. Curt Bears has not review results at this time. We will call her back when Dr Curt Bears reviewed it.  IMPRESSION of the results was given to pt. Pt states that "wonders if Venida Jarvis knows more of the result". Pt is aware that this message will be send to [facility] Dr [employer] nurse for reviewing.

## 2017-10-21 NOTE — Telephone Encounter (Signed)
Pt informed of results and that it had been read by a physician prior to yesterday's call. Patient verbalized understanding and agreeable to plan.

## 2017-10-28 ENCOUNTER — Telehealth: Payer: Self-pay

## 2017-10-28 NOTE — Telephone Encounter (Signed)
Confirmed remote transmission w/ pt husband.   

## 2017-11-01 DIAGNOSIS — S335XXA Sprain of ligaments of lumbar spine, initial encounter: Secondary | ICD-10-CM | POA: Diagnosis not present

## 2017-11-02 ENCOUNTER — Encounter: Payer: Self-pay | Admitting: Cardiology

## 2017-11-04 ENCOUNTER — Other Ambulatory Visit: Payer: Self-pay

## 2017-11-04 ENCOUNTER — Encounter: Payer: Self-pay | Admitting: Emergency Medicine

## 2017-11-04 ENCOUNTER — Encounter: Payer: Self-pay | Admitting: Gynecology

## 2017-11-04 ENCOUNTER — Emergency Department
Admission: EM | Admit: 2017-11-04 | Discharge: 2017-11-04 | Disposition: A | Discharge status: Home / Self Care | Payer: Worker's Compensation | Attending: Emergency Medicine | Admitting: Emergency Medicine

## 2017-11-04 ENCOUNTER — Emergency Department: Payer: Worker's Compensation

## 2017-11-04 ENCOUNTER — Ambulatory Visit
Admission: EM | Admit: 2017-11-04 | Discharge: 2017-11-04 | Disposition: A | Discharge status: Hospice | Payer: Worker's Compensation | Source: Home / Self Care | Attending: Family Medicine | Admitting: Family Medicine

## 2017-11-04 DIAGNOSIS — S0990XA Unspecified injury of head, initial encounter: Secondary | ICD-10-CM | POA: Diagnosis not present

## 2017-11-04 DIAGNOSIS — T148XXA Other injury of unspecified body region, initial encounter: Secondary | ICD-10-CM

## 2017-11-04 DIAGNOSIS — W108XXA Fall (on) (from) other stairs and steps, initial encounter: Secondary | ICD-10-CM | POA: Diagnosis not present

## 2017-11-04 DIAGNOSIS — Y9259 Other trade areas as the place of occurrence of the external cause: Secondary | ICD-10-CM | POA: Diagnosis not present

## 2017-11-04 DIAGNOSIS — Y9389 Activity, other specified: Secondary | ICD-10-CM | POA: Diagnosis not present

## 2017-11-04 DIAGNOSIS — I10 Essential (primary) hypertension: Secondary | ICD-10-CM | POA: Diagnosis not present

## 2017-11-04 DIAGNOSIS — S0093XA Contusion of unspecified part of head, initial encounter: Secondary | ICD-10-CM

## 2017-11-04 DIAGNOSIS — Z79899 Other long term (current) drug therapy: Secondary | ICD-10-CM | POA: Diagnosis not present

## 2017-11-04 DIAGNOSIS — T07XXXA Unspecified multiple injuries, initial encounter: Secondary | ICD-10-CM

## 2017-11-04 DIAGNOSIS — Z7901 Long term (current) use of anticoagulants: Secondary | ICD-10-CM | POA: Diagnosis not present

## 2017-11-04 DIAGNOSIS — S40022A Contusion of left upper arm, initial encounter: Secondary | ICD-10-CM | POA: Insufficient documentation

## 2017-11-04 DIAGNOSIS — S7011XA Contusion of right thigh, initial encounter: Secondary | ICD-10-CM

## 2017-11-04 DIAGNOSIS — S70311A Abrasion, right thigh, initial encounter: Secondary | ICD-10-CM | POA: Diagnosis not present

## 2017-11-04 DIAGNOSIS — Y990 Civilian activity done for income or pay: Secondary | ICD-10-CM | POA: Diagnosis not present

## 2017-11-04 DIAGNOSIS — S40812A Abrasion of left upper arm, initial encounter: Secondary | ICD-10-CM | POA: Diagnosis not present

## 2017-11-04 DIAGNOSIS — S90512A Abrasion, left ankle, initial encounter: Secondary | ICD-10-CM | POA: Diagnosis not present

## 2017-11-04 DIAGNOSIS — S9002XA Contusion of left ankle, initial encounter: Secondary | ICD-10-CM | POA: Insufficient documentation

## 2017-11-04 DIAGNOSIS — W19XXXA Unspecified fall, initial encounter: Secondary | ICD-10-CM

## 2017-11-04 MED ORDER — BACLOFEN 10 MG PO TABS: 10.0000 mg | ORAL_TABLET | Freq: Every day | ORAL | 1 refills | Status: DC

## 2017-11-04 MED ORDER — TRAMADOL HCL 50 MG PO TABS: 50.0000 mg | ORAL_TABLET | Freq: Four times a day (QID) | ORAL | 0 refills | Status: DC | PRN

## 2017-11-04 NOTE — ED Provider Notes (Signed)
[facility] Emergency Department Provider Note  ____________________________________________   None    (approximate)  I have reviewed the triage vital signs and the nursing notes.   HISTORY  Chief Complaint Fall    HPI Vanessa [name] is a 67 y.o. female presents emergency department after falling down a flight of 16 stairs while at work.  She states the area is [place] shaped when she went to cut the light out there was not enough room for her foot at the top and she fell forwards.  She hit her head but did not lose consciousness.  She has several scrapes and bruises.  She is concerned about the amount of swelling on the right femur.  She denies any low back pain.  She denies chest pain, shortness of breath, or abdominal pain.  She states the left ribs also hurt.  Past Medical History:  Diagnosis Date  . Anxiety   . AR (allergic rhinitis)   . Atrial fibrillation ([place])   . Depressed   . Glaucoma   . HTN (hypertension)   . Overweight(278.02)     Patient Active Problem List   Diagnosis Date Noted  . Family history of premature CAD 08/05/2017  . SVT (supraventricular tachycardia) ([place]) 11/09/2016  . Chest pain 12/03/2011  . HTN (hypertension) 12/03/2011  . Hyperlipidemia 12/03/2011    Past Surgical History:  Procedure Laterality Date  . ABDOMINAL HYSTERECTOMY    . CATARACT EXTRACTION    . catherization  11/00   normal  . cyrotherapy    . EP IMPLANTABLE DEVICE N/A 03/02/2016   Procedure: Loop Recorder Insertion;  Surgeon: Will [name], MD;  Location: [facility];  Service: Cardiovascular;  Laterality: N/A;  . EYE SURGERY     x2  . GLAUCOMA SURGERY     bilateral  . WISDOM TOOTH EXTRACTION      Prior to Admission medications   Medication Sig Start Date End Date Taking? Authorizing Provider  baclofen (LIORESAL) 10 MG tablet Take 1 tablet (10 mg total) by mouth daily. 11/04/17 11/04/18  Zakara Parkey, Linden Dolin, PA-C  diltiazem (CARDIZEM CD)  120 MG 24 hr capsule Take 1 capsule (120 mg total) by mouth daily. 07/19/17 10/17/17  Camnitz, Ocie Doyne, MD  ELIQUIS 5 MG TABS tablet TAKE ONE TABLET BY MOUTH TWICE DAILY 11/05/16   Camnitz, Ocie Doyne, MD  flecainide (TAMBOCOR) 100 MG tablet Take 1 tablet (100 mg total) by mouth 2 (two) times daily. 07/19/17   Camnitz, Will Hassell Done, MD  Garlic (GARLIQUE PO) Take 1 tablet by mouth daily.    [provider]  losartan-hydrochlorothiazide (HYZAAR) 100-25 MG per tablet Take 1 tablet by mouth daily.  10/29/11   [provider]  Multiple Vitamins-Minerals (OCUVITE PO) Take 1 tablet by mouth daily.    [provider]  NEXIUM 40 MG capsule Take 40 mg by mouth daily before breakfast.  09/29/11   [provider]  Phendimetrazine Tartrate 105 MG CP24 TAKE ONE CAPSULE BY MOUTH DAILY 08/29/17   [provider]  ranitidine (ZANTAC) 300 MG capsule Take 300 mg by mouth daily. 07/19/17   [provider]  rosuvastatin (CRESTOR) 10 MG tablet Take 1 tablet (10 mg total) by mouth daily. 08/31/17 11/29/17  Camnitz, Ocie Doyne, MD  traMADol (ULTRAM) 50 MG tablet Take 1 tablet (50 mg total) by mouth every 6 (six) hours as needed. 11/04/17   Versie Starks, PA-C    Allergies Codeine  Family History  Problem  Relation Age of Onset  . Stroke Mother   . Heart attack Father     Social History Social History   Tobacco Use  . Smoking status: Never Smoker  . Smokeless tobacco: Never Used  Substance Use Topics  . Alcohol use: No  . Drug use: No    Review of Systems  Constitutional: No fever/chills, positive for head injury Eyes: No visual changes. ENT: No sore throat. Respiratory: Denies cough Genitourinary: Negative for dysuria. Musculoskeletal: Negative for back pain.  Positive for right femur pain Skin: Negative for rash.  Positive for multiple abrasions and bruising    ____________________________________________   PHYSICAL EXAM:  VITAL SIGNS: ED  Triage Vitals [11/04/17 1439]  Enc Vitals Group     BP (!) 154/53     Pulse Rate (!) 49     Resp 16     Temp 98 F (36.7 C)     Temp src      SpO2 100 %     Weight 183 lb (83 kg)     Height 5\' 8"  (1.727 m)     Head Circumference      Peak Flow      Pain Score 7     Pain Loc      Pain Edu?      Excl. in [place]?     Constitutional: Alert and oriented. Well appearing and in no acute distress.  Is able to answer all questions appropriately Eyes: Conjunctivae are normal.  Head: Positive for swelling and tenderness on the crown of the head. Nose: No congestion/rhinnorhea.  No lacerations or bruising noted at the nose Mouth/Throat: Mucous membranes are moist.   Neck: Is supple, no lymphadenopathy, no cervical tenderness is noted Cardiovascular: Normal rate, regular rhythm. Respiratory: Normal respiratory effort.  No retractions Abdomen: Is soft, nontender GU: deferred Musculoskeletal: FROM all extremities, warm and well perfused.  There is a large hematoma on the lateral side of the right femur.  Bruising along the left ankle.  Bruising along the left arm with multiple abrasions.  Tenderness at the left flank area but no crepitus. Neurologic:  Normal speech and language.  Skin:  Skin is warm, dry and intact. No rash noted. Psychiatric: Mood and affect are normal. Speech and behavior are normal.  ____________________________________________   LABS (all labs ordered are listed, but only abnormal results are displayed)  Labs Reviewed - No data to display ____________________________________________   ____________________________________________  RADIOLOGY  CT the head is negative for any acute abnormality X-ray of the right femur is negative except for hematoma  ____________________________________________   PROCEDURES  Procedure(s) performed: Ace wrap was applied by nursing staff to the right  femur  Procedures    ____________________________________________   INITIAL IMPRESSION / ASSESSMENT AND PLAN / ED COURSE  Pertinent labs & imaging results that were available during my care of the patient were reviewed by me and considered in my medical decision making (see chart for details).  Patient is 67 year old female presents to the emergency department complaining of a fall down 16 stairs.  She states they were hardwood steps.  She states that her foot did not fit at the top where she cut the light out and she fell forward.  She hit her head but did not lose consciousness.  States she rolled completely down the steps.  She is complaining of left rib, left arm, right femur pain, and multiple abrasions and bruises.  On physical exam the patient is talkative.  She is  able to bear weight.  The spine is not tender.  The skull is tender at the crown of the head.  The left arm has a large bruise and abrasions but there is no bony tenderness noted.  The right femur has a large hematoma and is slightly tender to palpation.  The ankles both have abrasions and bruising.  The remainder the exam is unremarkable  CT the head is negative for any acute abnormality.  X-ray of the right femur is negative  Explained the CT and x-ray findings to the patient.  She is to continue to wear the Ace wrap as compression for the hematoma.  Apply ice to the area for the next 2 days and switch to warm compresses.  Told her that if the area becomes jelly like a orthopedic doctor could draw the fluid out of the hematoma.  She is to apply ice to all areas that hurt.  She was given a prescription for a muscle relaxer and tramadol for pain as needed.  She is to follow-up with the acute care for employee health and wellness in 3 days for a recheck.  She was given work limitations for no climbing no lifting.  She is to perform desk duty only.  These restrictions are in effect for 5 days.  She states she understands will  comply with our treatment plan.  She was discharged in stable condition.     As part of my medical decision making, I reviewed the following data within the [place] notes reviewed and incorporated, Old chart reviewed, Radiograph reviewed CT of the head is negative, x-ray of the femur is negative for bony abnormality but does show evidence of a large hematoma, Notes from prior ED visits and [place] Controlled Substance Database  ____________________________________________   FINAL CLINICAL IMPRESSION(S) / ED DIAGNOSES  Final diagnoses:  Fall, initial encounter  Injury of head, initial encounter  Hematoma  Multiple abrasions      NEW MEDICATIONS STARTED DURING THIS VISIT:  Discharge Medication List as of 11/04/2017  4:45 PM    START taking these medications   Details  baclofen (LIORESAL) 10 MG tablet Take 1 tablet (10 mg total) by mouth daily., Starting Thu 11/04/2017, Until Fri 11/04/2018, Print    traMADol (ULTRAM) 50 MG tablet Take 1 tablet (50 mg total) by mouth every 6 (six) hours as needed., Starting Thu 11/04/2017, Print         Note:  This document was prepared using Dragon voice recognition software and may include unintentional dictation errors.    Versie Starks, PA-C 11/04/17 1939    Nena Polio, MD 11/04/17 2029

## 2017-11-04 NOTE — Discharge Instructions (Addendum)
Follow-up with the [facility] clinic acute care or may have been urgent care in 3 days for recheck.  If you are worsening please return the emergency department.  If your headache becomes severe please return to the emergency department.  Apply ice to all areas that are bruised.  Vaseline or antibiotic ointment to the abrasions.  Keep ice on the large hematoma for 2 days.  Then you may apply wet heat to the area.  If the area becomes fluctuant a orthopedic doctor would be able to draw the fluid out of it.

## 2017-11-04 NOTE — ED Triage Notes (Signed)
Per patient fell sown a flight of stairs at work today. Pt. C/o head injury/ left arm and left ankle bruise and swollen / knot at right leg.

## 2017-11-04 NOTE — ED Provider Notes (Addendum)
[facility]    CSN: [id number] Arrival date & time: 11/04/17  1319     History   Chief Complaint Chief Complaint  Patient presents with  . Work Related Injury    HPI Vanessa [name] is a 67 y.o. female.   HPI  67 year old female Worker's Comp. injury presents after she fell down a flight of 16 steps at work today. States  that she hit her head, her left arm, left ankle and right leg.  She missed the top step and tumbled all the way down the stairs.  She is on chronic Eliquis because of atrial fibrillation.  She is also on flecainide.  States that she did not have any loss of consciousness.  Did not have a syncopal episode causing the fall.  Fall occurred approximate 1230 this afternoon.  Is alert and oriented to 3. Not Complaining of neck pain.       Past Medical History:  Diagnosis Date  . Anxiety   . AR (allergic rhinitis)   . Atrial fibrillation ([place])   . Depressed   . Glaucoma   . HTN (hypertension)   . Overweight(278.02)     Patient Active Problem List   Diagnosis Date Noted  . Family history of premature CAD 08/05/2017  . SVT (supraventricular tachycardia) ([place]) 11/09/2016  . Chest pain 12/03/2011  . HTN (hypertension) 12/03/2011  . Hyperlipidemia 12/03/2011    Past Surgical History:  Procedure Laterality Date  . ABDOMINAL HYSTERECTOMY    . CATARACT EXTRACTION    . catherization  11/00   normal  . cyrotherapy    . EP IMPLANTABLE DEVICE N/A 03/02/2016   Procedure: Loop Recorder Insertion;  Surgeon: Will [name], MD;  Location: [facility];  Service: Cardiovascular;  Laterality: N/A;  . EYE SURGERY     x2  . GLAUCOMA SURGERY     bilateral  . WISDOM TOOTH EXTRACTION      OB History   None      Home Medications    Prior to Admission medications   Medication Sig Start Date End Date Taking? Authorizing Provider  ELIQUIS 5 MG TABS tablet TAKE ONE TABLET BY MOUTH TWICE DAILY 11/05/16  Yes Camnitz, Ocie Doyne, MD    flecainide (TAMBOCOR) 100 MG tablet Take 1 tablet (100 mg total) by mouth 2 (two) times daily. 07/19/17  Yes Camnitz, Will Hassell Done, MD  Garlic (GARLIQUE PO) Take 1 tablet by mouth daily.   Yes [provider]  losartan-hydrochlorothiazide (HYZAAR) 100-25 MG per tablet Take 1 tablet by mouth daily.  10/29/11  Yes [provider]  Multiple Vitamins-Minerals (OCUVITE PO) Take 1 tablet by mouth daily.   Yes [provider]  NEXIUM 40 MG capsule Take 40 mg by mouth daily before breakfast.  09/29/11  Yes [provider]  ranitidine (ZANTAC) 300 MG capsule Take 300 mg by mouth daily. 07/19/17  Yes [provider]  rosuvastatin (CRESTOR) 10 MG tablet Take 1 tablet (10 mg total) by mouth daily. 08/31/17 11/29/17 Yes Camnitz, Will Hassell Done, MD  diltiazem (CARDIZEM CD) 120 MG 24 hr capsule Take 1 capsule (120 mg total) by mouth daily. 07/19/17 10/17/17  Camnitz, Ocie Doyne, MD  Phendimetrazine Tartrate 105 MG CP24 TAKE ONE CAPSULE BY MOUTH DAILY 08/29/17   [provider]    Family History Family History  Problem Relation Age of Onset  . Stroke Mother   . Heart attack Father     Social History Social History   Tobacco  Use  . Smoking status: Never Smoker  . Smokeless tobacco: Never Used  Substance Use Topics  . Alcohol use: No  . Drug use: No     Allergies   Codeine   Review of Systems Review of Systems  Constitutional: Positive for activity change. Negative for chills, fatigue and fever.  Neurological: Negative for syncope and weakness.  All other systems reviewed and are negative.    Physical Exam Triage Vital Signs ED Triage Vitals  Enc Vitals Group     BP 11/04/17 1341 (!) 146/48     Pulse Rate 11/04/17 1341 (!) 49     Resp 11/04/17 1341 16     Temp 11/04/17 1341 98 F (36.7 C)     Temp Source 11/04/17 1341 Oral     SpO2 11/04/17 1341 100 %     Weight 11/04/17 1338 183 lb (83 kg)     Height 11/04/17 1338 5\' 8"  (1.727 m)     Head  Circumference --      Peak Flow --      Pain Score 11/04/17 1337 6     Pain Loc --      Pain Edu? --      Excl. in [place]? --    No data found.  Updated Vital Signs BP (!) 146/48 (BP Location: Right Arm)   Pulse (!) 49   Temp 98 F (36.7 C) (Oral)   Resp 16   Ht 5\' 8"  (1.727 m)   Wt 183 lb (83 kg)   SpO2 100%   BMI 27.83 kg/m   Visual Acuity Right Eye Distance:   Left Eye Distance:   Bilateral Distance:    Right Eye Near:   Left Eye Near:    Bilateral Near:     Physical Exam  Constitutional: She is oriented to person, place, and time. She appears well-developed and well-nourished. No distress.  HENT:  Head: Normocephalic and atraumatic.  Eyes: Pupils are equal, round, and reactive to light. EOM are normal. Right eye exhibits no discharge. Left eye exhibits no discharge.  Neck: Normal range of motion.  Musculoskeletal: Normal range of motion. She exhibits tenderness.  Neurological: She is alert and oriented to person, place, and time. No cranial nerve deficit. She exhibits normal muscle tone. Coordination normal.  Skin: Skin is warm and dry. She is not diaphoretic.  Psychiatric: She has a normal mood and affect. Her behavior is normal. Judgment and thought content normal.  Nursing note and vitals reviewed.    UC Treatments / Results  Labs (all labs ordered are listed, but only abnormal results are displayed) Labs Reviewed - No data to display  EKG None  Radiology No results found.  Procedures Procedures (including critical care time)  Medications Ordered in UC Medications - No data to display  Initial Impression / Assessment and Plan / UC Course  I have reviewed the triage vital signs and the nursing notes.  Pertinent labs & imaging results that were available during my care of the patient were reviewed by me and considered in my medical decision making (see chart for details).     She had remained stable for the short time she was in our facility.   Alert oriented.  She has a contusion and abrasion on the top of her head.  She has various scrapes and scratches with a evolving hematoma over her proximal right lateral femur.  No localizing signs neurologically.  Because of her use of Eliquis and her also taking  flecainide explained that it would be safer for her to be seen and evaluated at the emergency room.  Spoke with the Opal Sidles the triage nurse to give her report.  She left our facility and was being transported by coworker privately owned vehicle.  Final Clinical Impressions(s) / UC Diagnoses   Final diagnoses:  Abrasions of multiple sites  Fall (on) (from) other stairs and steps, initial encounter  Injury of head, initial encounter   Discharge Instructions   None    ED Prescriptions    None     Controlled Substance Prescriptions [place] Controlled Substance Registry consulted? Not Applicable   Lorin Picket, PA-C 11/04/17 1412    Lorin Picket, PA-C 11/04/17 1414

## 2017-11-04 NOTE — ED Triage Notes (Signed)
Fell at work [employer]. Seen at urgent care and sent here for head ct

## 2017-11-08 ENCOUNTER — Ambulatory Visit (INDEPENDENT_AMBULATORY_CARE_PROVIDER_SITE_OTHER): Payer: PPO | Admitting: *Deleted

## 2017-11-08 DIAGNOSIS — R002 Palpitations: Secondary | ICD-10-CM | POA: Diagnosis not present

## 2017-11-08 NOTE — Progress Notes (Signed)
Carelink Summary Report / Loop Recorder 

## 2017-11-10 ENCOUNTER — Other Ambulatory Visit: Payer: Self-pay | Admitting: *Deleted

## 2017-11-10 NOTE — Patient Outreach (Signed)
[facility]) Care Management  11/10/2017  Vanessa [name] 04/14/1950 [id number]   Telephone Screen  Referral Date: 11/08/17 Referral Source: UM referral  Referral Reason: Medication assistance for Eliquis Insurance: HTA  Outreach attempt # 1 successful at her home number after an attempt to reach her at her mobile number (but the voice mail box is full) Patient is able to verify HIPAA Reviewed and addressed referral to [facility] with patient Cm discussed the call attempt to her mobile number that was recommended in the HTA referral Discussed that the mail box was full Vanessa [name] reports that she still can be best reached at her mobile number She confirms that the mail box is not setup and she will fix soon.  She confirms she is home related to a injury at work and for staff to try her home number if she does not answer her mobile number  Social: Vanessa [name] lives at home with her husband and has support also of her daughter She report she still works but is only home today related to a recent fell down a flight of stairs at work.  Pt states this is workman's compensation coverage for the injury. She reports she in independent in her care but has been in pain from the injury. She confirms having medicine and DME to assist in her injury She denies any needs at this time  Conditions: HTN, SVT, chest pain, hyperlipidemia, GERD recent fall at work  Medications: She reports she is doing well with medicines except her Eliquis cost is causing her to get to the donut hole earlier this year She reports not being in donut hole until generally in September. She reports contact from HTA staff and was sent information for HTA on this week in which she has not reviewed yet.  Report she spoke with a pharmacist at [facility] about her medicines.  Reports the co pay for a 30 day supply of Eliquis has not increased yet. She states she was just been given Crestor and will have other cardiac medication  changes soon which may change her monthly medication cost.    Appointments: She reports seeing her primary MD in April 2019 for a physical   Advance Directives: She does not have advance directives but would like to discuss with her husband before completing interventions  Consent: [facility] RN CM reviewed [facility] services with patient. Patient gave verbal consent for services [facility] pharmacy. She has agreed to speak with [facility] pharmacist to see what resources is possible to assist her   Plan: [place] will refer Vanessa [name] to [facility] pharmacy to see if there is possible resources to assist with cost of medicines   Nuel Dejaynes L. Lavina Hamman, RN, BSN, CCM [facility] Telephonic Care Management Care Coordinator Direct number [phone]  [facility] number [phone] Fax number [phone]

## 2017-11-11 ENCOUNTER — Other Ambulatory Visit: Payer: Self-pay | Admitting: Physician Assistant

## 2017-11-11 ENCOUNTER — Ambulatory Visit
Admission: RE | Admit: 2017-11-11 | Discharge: 2017-11-11 | Disposition: A | Discharge status: Home / Self Care | Payer: PPO | Source: Ambulatory Visit | Attending: Physician Assistant | Admitting: Physician Assistant

## 2017-11-11 ENCOUNTER — Ambulatory Visit
Admission: RE | Admit: 2017-11-11 | Discharge: 2017-11-11 | Disposition: A | Discharge status: Home / Self Care | Payer: Worker's Compensation | Source: Ambulatory Visit | Attending: Physician Assistant | Admitting: Physician Assistant

## 2017-11-11 ENCOUNTER — Other Ambulatory Visit: Payer: Self-pay | Admitting: Pharmacist

## 2017-11-11 DIAGNOSIS — M79662 Pain in left lower leg: Secondary | ICD-10-CM | POA: Insufficient documentation

## 2017-11-11 DIAGNOSIS — M25552 Pain in left hip: Secondary | ICD-10-CM | POA: Insufficient documentation

## 2017-11-11 DIAGNOSIS — M545 Low back pain: Secondary | ICD-10-CM | POA: Diagnosis not present

## 2017-11-11 DIAGNOSIS — W109XXA Fall (on) (from) unspecified stairs and steps, initial encounter: Secondary | ICD-10-CM | POA: Diagnosis not present

## 2017-11-11 DIAGNOSIS — M8580 Other specified disorders of bone density and structure, unspecified site: Secondary | ICD-10-CM | POA: Diagnosis not present

## 2017-11-11 DIAGNOSIS — Y990 Civilian activity done for income or pay: Secondary | ICD-10-CM | POA: Insufficient documentation

## 2017-11-11 DIAGNOSIS — R52 Pain, unspecified: Secondary | ICD-10-CM

## 2017-11-11 DIAGNOSIS — M1712 Unilateral primary osteoarthritis, left knee: Secondary | ICD-10-CM | POA: Insufficient documentation

## 2017-11-11 NOTE — Patient Outreach (Signed)
[place] [facility]) Care Management  11/11/2017  Vanessa [name] 03-30-1951 [id number]   67 year old female referred to [facility] by [employer] Department for medication assistance with Eliquis.  PMHx includes, but not limited to, HTN, HLD, atrial fibrillation, SVTs, anxiety, and depression.   Unsuccessful call to Ms. [employer] on home and mobile numbers. I left a HIPAA compliant voicemail on the phone.  The mobile phone voicemail is full and cannot accept messages.  The Work number was an automated response system.    Plan: I will send patient a letter describing [facility] services and follow-up again next week.   Ralene Bathe, PharmD, [place] [phone]

## 2017-11-12 ENCOUNTER — Telehealth: Payer: Self-pay | Admitting: *Deleted

## 2017-11-12 MED ORDER — DRONEDARONE HCL 400 MG PO TABS: 400.0000 mg | ORAL_TABLET | Freq: Two times a day (BID) | ORAL | 0 refills | Status: DC

## 2017-11-12 NOTE — Telephone Encounter (Signed)
Pt stopped Flecainide 7/29 as advised. Advised to start Multaq.  Rx sent to pharmacy to check on cost.  Samples given to patient. Pt understands I will follow up next week to make sure this is affordable and arrange any follow up. She is agreeable to plan.

## 2017-11-12 NOTE — Telephone Encounter (Signed)
Notes recorded by Stanton Kidney, RN on 11/08/2017 at 6:42 PM EDT Advised pt to stop Flecainide. She understands I will follow up by the end of the week to discuss new medication, when to start it, and follow up needs. Pt is agreeable to plan. ------  Notes recorded by Constance Haw, MD on 11/01/2017 at 12:01 PM EDT High calcium score noted. Need to switch flecainide to Multitak. Agree with follow-up in lipid clinic for medication titration.

## 2017-11-15 ENCOUNTER — Telehealth: Payer: Self-pay

## 2017-11-15 NOTE — Telephone Encounter (Signed)
I attempted to do a non formulary exception through covermymeds for the pts Multaq but was unable as I continued to receive a message stating "patient not found". I called [place] and was given a phone number to call [employer].  I called [employer] and requested a Non Formulary exception form from [place] to be faxed to me. Shea Stakes faxed the form to me. I have completed the form and placed it in Dr [employer] mail bin awaiting his signature.

## 2017-11-16 LAB — CUP PACEART REMOTE DEVICE CHECK
Implantable Pulse Generator Implant Date: 20171120
MDC IDC SESS DTM: 20190626023629

## 2017-11-16 NOTE — Telephone Encounter (Signed)
Pt fell about 1 1/2 weeks ago but still experiecing a lot of pain.  Advised that she may take naproxen temporarily during this acute issue -- per Dr. Curt Bears approval. Advised to take least amt possible to control pain. Advised to monitor for signs of bleeding d/t increased risk while taking naproxen. Pt states that she will use as little as possible and take only 1-2 times a day. She is appreciative of the return call and discussing concerns with her.  She is relieved to know that she can take this temporarily.

## 2017-11-16 NOTE — Telephone Encounter (Signed)
Follow up    Pt c/o medication issue:  1. Name of Medication: Eliquis   2. How are you currently taking this medication (dosage and times per day)? TAKE ONE TABLET BY MOUTH TWICE DAILY  3. Are you having a reaction (difficulty breathing--STAT)? No   4. What is your medication issue? Patient states that she had a fall and wants to know is the she can take naproxen along with eliquis.

## 2017-11-16 NOTE — Telephone Encounter (Signed)
**Note De-Identified Nikash Mortensen Obfuscation** Dr Curt Bears has signed the form and I have faxed it to [facility].

## 2017-11-17 ENCOUNTER — Other Ambulatory Visit: Payer: Self-pay | Admitting: Pharmacist

## 2017-11-17 ENCOUNTER — Ambulatory Visit: Payer: Self-pay | Admitting: Pharmacist

## 2017-11-17 NOTE — Patient Outreach (Signed)
[facility]) Care Management  11/17/2017  Cereniti Curb 1950/11/10 [id number]  67 year old female referred to [facility] by [employer] Department for medication assistance with Eliquis.  PMHx includes, but not limited to, HTN, HLD, atrial fibrillation, SVTs, anxiety, and depression.   Unsuccessful call attempt #2 to Ms. [employer].  I left a HIPAA compliant voicemail on her home phone.  Mobile phone had a voicemail that was full and could not accept messages.   Plan: I will make 3rd call attempt in 3-4 business days.   Ralene Bathe, PharmD, [place] [phone]

## 2017-11-19 NOTE — Telephone Encounter (Addendum)
**Note De-Identified Vanessa [name] Obfuscation** Letter received Vanessa [name] fax from [facility] stating that they have denied the pts Multaq Non formulary exception. Reason: Multaq was requested for a non-medically-excepted indication. Atherosclerotic heart disease of native coronary artery without angina pectoris.  The specific information that we need to make a determination is:  Diagnosis of a medically-accepted indication (approved medical condition) such as: atrial fibrillation.    I have corrected diagnosis on the form (changed to atrial fibrillation) and faxed it back to [facility].

## 2017-11-20 ENCOUNTER — Other Ambulatory Visit: Payer: Self-pay | Admitting: Cardiology

## 2017-11-22 ENCOUNTER — Other Ambulatory Visit: Payer: Self-pay | Admitting: Pharmacist

## 2017-11-22 NOTE — Telephone Encounter (Signed)
Pt last saw Dr Curt Bears 07/19/17, last labs 08/26/17 Creat 1.0, age 68, weight 85.7kg, based on specified criteria pt is on appropriate dosage of Eliquis 5mg  BID.  Will refill rx.

## 2017-11-22 NOTE — Telephone Encounter (Signed)
Letter received from [facility] stating that they have approved the pts Multaq Non formulary exception. Approval good from 11/20/17 until 04/12/2018.  I have notified the pts pharmacy.

## 2017-11-22 NOTE — Patient Outreach (Signed)
[facility]) Care Management  11/22/2017  Sanari Offner December 21, 1950 [id number]   67year old femalereferred to [place] Management by [employer] Departmentfor medication assistance with Eliquis. PMHx includes, but not limited to, HTN, HLD, atrial fibrillation, SVTs, anxiety, and depression.   Unsuccessful call attempt #3 to Ms. [employer].  I left a HIPAA compliant voicemail on her home phone.    Plan: If not return call from patient by Wednesday 11/24/2017, I will close [facility] pharmacy case.   Ralene Bathe, PharmD, [place] [phone]

## 2017-11-24 ENCOUNTER — Telehealth: Payer: Self-pay | Admitting: Cardiology

## 2017-11-24 ENCOUNTER — Other Ambulatory Visit: Payer: Self-pay | Admitting: Pharmacist

## 2017-11-24 DIAGNOSIS — Z1231 Encounter for screening mammogram for malignant neoplasm of breast: Secondary | ICD-10-CM | POA: Diagnosis not present

## 2017-11-24 DIAGNOSIS — M85851 Other specified disorders of bone density and structure, right thigh: Secondary | ICD-10-CM | POA: Diagnosis not present

## 2017-11-24 DIAGNOSIS — N959 Unspecified menopausal and perimenopausal disorder: Secondary | ICD-10-CM | POA: Diagnosis not present

## 2017-11-24 NOTE — Telephone Encounter (Signed)
Spoke w/ pt husband and requested that she send a manual transmission b/c her home monitor has not updated in at least 14 days.   

## 2017-11-24 NOTE — Patient Outreach (Addendum)
[facility]) Care Management  11/24/2017  Vanessa [name] [birth year] [id number]  Outreach call #3 to Ms. [name] regarding medication assistance.  Patient answered but stated this is not a good time for her to talk.  She reports that she will try to call me back later today.    Plan: I will await call back from Ms. [employer].  If I have not heard back by the end of the week, I will close patient case.   Ralene Bathe, PharmD, [place] [phone]

## 2017-11-26 ENCOUNTER — Other Ambulatory Visit: Payer: Self-pay | Admitting: Pharmacist

## 2017-11-26 NOTE — Patient Outreach (Signed)
[facility]) Care Management  11/26/2017  Vanessa [name] 1951-01-18 [id number]  67 year old female referred to [place] for medication assistance by HTA.  I have been unable to engage with patient telephonically or via mail.  I will close patient case at this time however am happy to assist in the future if patient interested in [facility] services.   Case closure letter will be sent to provider and any active [facility] care team members will be updated as appropriate.    Ralene Bathe, PharmD, [place] [phone]

## 2017-11-29 ENCOUNTER — Other Ambulatory Visit: Payer: PPO | Admitting: *Deleted

## 2017-11-29 DIAGNOSIS — E782 Mixed hyperlipidemia: Secondary | ICD-10-CM | POA: Diagnosis not present

## 2017-11-29 LAB — HEPATIC FUNCTION PANEL
ALT: 15 IU/L (ref 0–32)
AST: 17 IU/L (ref 0–40)
Albumin: 3.9 g/dL (ref 3.6–4.8)
Alkaline Phosphatase: 65 IU/L (ref 39–117)
BILIRUBIN TOTAL: 0.6 mg/dL (ref 0.0–1.2)
BILIRUBIN, DIRECT: 0.2 mg/dL (ref 0.00–0.40)
Total Protein: 5.9 g/dL — ABNORMAL LOW (ref 6.0–8.5)

## 2017-11-29 LAB — LIPID PANEL
CHOLESTEROL TOTAL: 113 mg/dL (ref 100–199)
Chol/HDL Ratio: 2.8 ratio (ref 0.0–4.4)
HDL: 41 mg/dL (ref >39)
LDL CALC: 52 mg/dL (ref 0–99)
TRIGLYCERIDES: 102 mg/dL (ref 0–149)
VLDL Cholesterol Cal: 20 mg/dL (ref 5–40)

## 2017-12-01 ENCOUNTER — Encounter: Payer: Self-pay | Admitting: Cardiology

## 2017-12-14 ENCOUNTER — Ambulatory Visit (INDEPENDENT_AMBULATORY_CARE_PROVIDER_SITE_OTHER): Payer: PPO | Admitting: *Deleted

## 2017-12-14 DIAGNOSIS — R002 Palpitations: Secondary | ICD-10-CM | POA: Diagnosis not present

## 2017-12-14 NOTE — Progress Notes (Signed)
Carelink Summary Report / Loop Recorder 

## 2017-12-15 ENCOUNTER — Telehealth: Payer: Self-pay | Admitting: Cardiology

## 2017-12-15 NOTE — Telephone Encounter (Signed)
New Message   Pt states she has questions about her lab results. Please call

## 2017-12-17 NOTE — Telephone Encounter (Signed)
Informed patient of results and verbal understanding expressed.  

## 2017-12-20 LAB — CUP PACEART REMOTE DEVICE CHECK
Date Time Interrogation Session: 20190729034016
Implantable Pulse Generator Implant Date: 20171120

## 2017-12-29 ENCOUNTER — Telehealth: Payer: Self-pay | Admitting: Cardiology

## 2017-12-29 NOTE — Telephone Encounter (Signed)
LMOVM requesting that pt send manual transmission b/c home monitor has not updated in at least 14 days.    

## 2018-01-01 ENCOUNTER — Other Ambulatory Visit: Payer: Self-pay | Admitting: Cardiology

## 2018-01-04 LAB — CUP PACEART REMOTE DEVICE CHECK
Date Time Interrogation Session: 20190831040728
Implantable Pulse Generator Implant Date: 20171120

## 2018-01-10 ENCOUNTER — Telehealth: Payer: Self-pay | Admitting: Cardiology

## 2018-01-10 NOTE — Telephone Encounter (Signed)
Pt calls in reporting that she ran out of Multaq last week and was without for a week waiting on pharmacy to get if filled. She would really like Dr. Curt Bears to consider her being off AAD and see how she does.  States she hasn't had any AFib for awhile that she knows about (even though she understands that the AAD may be what's helping keep her in rhythm). Will forward to Dr Curt Bears for consideration to stop AAD. Pt understands I will follow up with her this week with his recommendation.

## 2018-01-10 NOTE — Telephone Encounter (Signed)
New message:    Patient would like to speak with nurse. It's about some medication.

## 2018-01-11 ENCOUNTER — Telehealth: Payer: Self-pay | Admitting: Cardiology

## 2018-01-11 NOTE — Telephone Encounter (Signed)
Spoke w/ pt and requested that she send a manual transmission b/c her home monitor has not updated in at least 14 days.   

## 2018-01-11 NOTE — Telephone Encounter (Addendum)
Pt aware Dr. Curt Bears agreeable to her stopping Multaq. Advised to continue Eliquis & Diltiazem. Pt will call if issues/AFib begin after stopping AAD. Follow up scheduled w/ WC Patient verbalized understanding and agreeable to plan.

## 2018-01-11 NOTE — Telephone Encounter (Signed)
If she wishes to be off AAD, can have a trial. Will likely need to start down the road again. If she wishes, can discuss ablation as well.

## 2018-01-13 ENCOUNTER — Ambulatory Visit (INDEPENDENT_AMBULATORY_CARE_PROVIDER_SITE_OTHER): Payer: PPO | Admitting: *Deleted

## 2018-01-13 DIAGNOSIS — R002 Palpitations: Secondary | ICD-10-CM | POA: Diagnosis not present

## 2018-01-13 DIAGNOSIS — I10 Essential (primary) hypertension: Secondary | ICD-10-CM

## 2018-01-13 LAB — CUP PACEART REMOTE DEVICE CHECK
Date Time Interrogation Session: 20191003104144
MDC IDC PG IMPLANT DT: 20171120

## 2018-01-13 NOTE — Progress Notes (Signed)
Carelink Summary Report / Loop Recorder 

## 2018-01-19 DIAGNOSIS — M25579 Pain in unspecified ankle and joints of unspecified foot: Secondary | ICD-10-CM | POA: Insufficient documentation

## 2018-01-19 DIAGNOSIS — M25572 Pain in left ankle and joints of left foot: Secondary | ICD-10-CM | POA: Insufficient documentation

## 2018-01-19 DIAGNOSIS — M79673 Pain in unspecified foot: Secondary | ICD-10-CM | POA: Insufficient documentation

## 2018-01-26 ENCOUNTER — Telehealth: Payer: Self-pay | Admitting: Cardiology

## 2018-01-26 NOTE — Telephone Encounter (Signed)
Spoke w/ pt husband and requested that she send a manual transmission b/c her home monitor has not updated in at least 14 days.   

## 2018-02-04 DIAGNOSIS — M79651 Pain in right thigh: Secondary | ICD-10-CM | POA: Insufficient documentation

## 2018-02-04 DIAGNOSIS — M545 Low back pain, unspecified: Secondary | ICD-10-CM | POA: Insufficient documentation

## 2018-02-07 ENCOUNTER — Telehealth: Payer: Self-pay | Admitting: Cardiology

## 2018-02-07 NOTE — Telephone Encounter (Signed)
Pt reports AFib coming and going several times a day and has occurred couple times in the last few weeks. Episodes last a few minutes and then back to normal rhythm. BP running a little on the high side b/c they recently switched her Hyzaar to something similar d/t backorder. Pt understands I will discuss w/ Camnitz and let her know recommendation/s. (maybe PRN med) Patient verbalized understanding and agreeable to plan.

## 2018-02-07 NOTE — Telephone Encounter (Signed)
New Message   Pt c/o medication issue:  1. Name of Medication: MULTAQ 400 MG tablet   2. How are you currently taking this medication (dosage and times per day)? TAKE 1 TABLET BY MOUTH TWICE DAILY WITH A MEAL  3. Are you having a reaction (difficulty breathing--STAT)? no  4. What is your medication issue? Pt states Dr. Curt Bears took her off this medication but says she has gone back into afib and would like to speak with the nurse. Pleae call

## 2018-02-08 ENCOUNTER — Telehealth: Payer: Self-pay | Admitting: Cardiology

## 2018-02-08 MED ORDER — DILTIAZEM HCL ER COATED BEADS 180 MG PO CP24: 180.0000 mg | ORAL_CAPSULE | Freq: Every day | ORAL | 6 refills | Status: DC

## 2018-02-08 NOTE — Telephone Encounter (Signed)
Advised to increase Diltiazem to 180 mg once daily. Rx sent to [facility]. Pt agreeable to plan.

## 2018-02-08 NOTE — Telephone Encounter (Signed)
Spoke w/ pt husband and requested that she send a manual transmission b/c her home monitor has not updated in at least 14 days.   

## 2018-02-15 ENCOUNTER — Ambulatory Visit (INDEPENDENT_AMBULATORY_CARE_PROVIDER_SITE_OTHER): Payer: PPO | Admitting: *Deleted

## 2018-02-15 DIAGNOSIS — R002 Palpitations: Secondary | ICD-10-CM | POA: Diagnosis not present

## 2018-02-15 DIAGNOSIS — I10 Essential (primary) hypertension: Secondary | ICD-10-CM

## 2018-02-16 NOTE — Progress Notes (Signed)
Carelink Summary Report / Loop Recorder 

## 2018-02-17 DIAGNOSIS — H402212 Chronic angle-closure glaucoma, right eye, moderate stage: Secondary | ICD-10-CM | POA: Diagnosis not present

## 2018-02-22 ENCOUNTER — Telehealth: Payer: Self-pay | Admitting: Cardiology

## 2018-02-22 NOTE — Telephone Encounter (Signed)
LMOVM requesting that pt send manual transmission b/c home monitor has not updated in at least 14 days.    

## 2018-03-01 ENCOUNTER — Encounter: Payer: Self-pay | Admitting: Cardiology

## 2018-03-01 ENCOUNTER — Ambulatory Visit (INDEPENDENT_AMBULATORY_CARE_PROVIDER_SITE_OTHER): Payer: PPO | Admitting: Cardiology

## 2018-03-01 VITALS — BP 124/76 | HR 67 | Ht 68.0 in | Wt 193.0 lb

## 2018-03-01 DIAGNOSIS — I480 Paroxysmal atrial fibrillation: Secondary | ICD-10-CM | POA: Diagnosis not present

## 2018-03-01 DIAGNOSIS — I10 Essential (primary) hypertension: Secondary | ICD-10-CM

## 2018-03-01 NOTE — Progress Notes (Signed)
Electrophysiology Office Note   Date:  03/01/2018   ID:  Vanessa [name], DOB Sep 05, 1950, MRN [id number]  PCP:  Vanessa Broker, MD  Cardiologist:  Vanessa [name] Primary Electrophysiologist:  Vanessa [name] Vanessa Leeds, MD    No chief complaint on file.    History of Present Illness: Vanessa [name] is a 67 y.o. female who presents today for electrophysiology evaluation.   History of exertional pain w/ neg MV 2013, FH CAD, HTN, HLD. Had a LINQ implanted for palpitations found to be in atrial fibrillation. She says that she has had a few further episodes of palpitations since last being seen. She has a 3% burden on her Linq interrogation.  Today, denies symptoms of palpitations, chest pain, shortness of breath, orthopnea, PND, lower extremity edema, claudication, dizziness, presyncope, syncope, bleeding, or neurologic sequela. The patient is tolerating medications without difficulties.  Overall she is doing well.  She continues to have episodes of palpitations with her Linq monitor shows to be PACs.  She is trying to go to the gym to workout.  She recently retired.  He did have a fall over the summer is continuing to recover.   Past Medical History:  Diagnosis Date  . Anxiety   . AR (allergic rhinitis)   . Atrial fibrillation ([place])   . Depressed   . Glaucoma   . HTN (hypertension)   . Overweight(278.02)    Past Surgical History:  Procedure Laterality Date  . ABDOMINAL HYSTERECTOMY    . CATARACT EXTRACTION    . catherization  11/00   normal  . cyrotherapy    . EP IMPLANTABLE DEVICE N/A 03/02/2016   Procedure: Loop Recorder Insertion;  Surgeon: Vanessa Posa Vanessa Leeds, MD;  Location: [facility];  Service: Cardiovascular;  Laterality: N/A;  . EYE SURGERY     x2  . GLAUCOMA SURGERY     bilateral  . WISDOM TOOTH EXTRACTION       Current Outpatient Medications  Medication Sig Dispense Refill  . diltiazem (CARDIZEM CD) 180 MG 24 hr capsule Take 1 capsule (180 mg total) by mouth daily.  30 capsule 6  . ELIQUIS 5 MG TABS tablet TAKE ONE TABLET BY MOUTH TWICE DAILY 180 tablet 2  . Garlic (GARLIQUE PO) Take 1 tablet by mouth daily.    . Multiple Vitamins-Minerals (OCUVITE PO) Take 1 tablet by mouth daily.    Marland Kitchen NEXIUM 40 MG capsule Take 40 mg by mouth daily before breakfast.     . Phendimetrazine Tartrate 105 MG CP24 TAKE ONE CAPSULE BY MOUTH DAILY    . ranitidine (ZANTAC) 300 MG capsule Take 300 mg by mouth daily.    . rosuvastatin (CRESTOR) 10 MG tablet Take 1 tablet (10 mg total) by mouth daily. 30 tablet 11  . valsartan-hydrochlorothiazide (DIOVAN-HCT) 160-12.5 MG tablet Take 1 tablet by mouth daily.     No current facility-administered medications for this visit.     Allergies:   Codeine   Social History:  The patient  reports that she has never smoked. She has never used smokeless tobacco. She reports that she does not drink alcohol or use drugs.   Family History:  The patient's family history includes Heart attack in her father; Stroke in her mother.   ROS:  Please see the history of present illness.   Otherwise, review of systems is positive for palpitations, abdominal pain, back pain, muscle pain.   All other systems are reviewed and negative.   PHYSICAL EXAM: VS:  BP 124/76  Pulse 67   Ht 5\' 8"  (1.727 m)   Wt 193 lb (87.5 kg)   BMI 29.35 kg/m  , BMI Body mass index is 29.35 kg/m. GEN: Well nourished, well developed, in no acute distress  HEENT: normal  Neck: no JVD, carotid bruits, or masses Cardiac: RRR; no murmurs, rubs, or gallops,no edema  Respiratory:  clear to auscultation bilaterally, normal work of breathing GI: soft, nontender, nondistended, + BS MS: no deformity or atrophy  Skin: warm and dry, device site well healed Neuro:  Strength and sensation are intact Psych: euthymic mood, full affect  EKG:  EKG is ordered today. Personal review of the ekg ordered shows sinus rhythm, nonspecific T wave changes, rate 67  Personal review of the device  interrogation today. Results in Vanessa [name]: 08/26/2017: BUN 20; Creatinine, Ser 1.00; Potassium 4.6; Sodium 138 11/29/2017: ALT 15    Lipid Panel     Component Value Date/Time   CHOL 113 11/29/2017 0744   TRIG 102 11/29/2017 0744   HDL 41 11/29/2017 0744   CHOLHDL 2.8 11/29/2017 0744   LDLCALC 52 11/29/2017 0744     Wt Readings from Last 3 Encounters:  03/01/18 193 lb (87.5 kg)  11/04/17 183 lb (83 kg)  11/04/17 183 lb (83 kg)      Other studies Reviewed: Additional studies/ records that were reviewed today include: TTE 02/19/16  Review of the above records today demonstrates:  - Left ventricle: The cavity size was normal. There was mild focal   basal hypertrophy of the septum. Systolic function was normal.   The estimated ejection fraction was in the range of 55% to 60%.   Wall motion was normal; there were no regional wall motion   abnormalities. Doppler parameters are consistent with abnormal   left ventricular relaxation (grade 1 diastolic dysfunction). - Mitral valve: There was mild regurgitation. - Left atrium: The atrium was mildly dilated.  ASSESSMENT AND PLAN:  1.  Paroxysmal atrial fibrillation: Currently on Eliquis.  Has been on both flecainide and Multitak Seri Kimmer episodes noted on link monitoring.  She is happy to be off of antiarrhythmics.  No changes.  This patients CHA2DS2-VASc Score and unadjusted Ischemic Stroke Rate (% per year) is equal to 3.2 % stroke rate/year from a score of 3  Above score calculated as 1 point each if present [CHF, HTN, DM, Vascular=MI/PAD/Aortic Plaque, Age if 65-74, or Female] Above score calculated as 2 points each if present [Age > 75, or Stroke/TIA/TE]   2. Hypertension: Well-controlled.  No changes.  Well-controlled today.  She does say that her blood pressure cuff at home is reading systolics in the 016W.  She Vanessa [name] bring her cuff in to check it against ours in 1 week.   Current medicines are reviewed at length  with the patient today.   The patient does not have concerns regarding her medicines.  The following changes were made today: None  Labs/ tests ordered today include:  Orders Placed This Encounter  Procedures  . EKG 12-Lead     Disposition:   FU with [employer] 6 months  Signed, Demarea Lorey Vanessa Leeds, MD  03/01/2018 4:34 PM     [address] [address] [phone] (office) [phone] (fax)

## 2018-03-02 LAB — CUP PACEART INCLINIC DEVICE CHECK
Date Time Interrogation Session: 20191119212427
Implantable Pulse Generator Implant Date: 20171120

## 2018-03-21 ENCOUNTER — Ambulatory Visit (INDEPENDENT_AMBULATORY_CARE_PROVIDER_SITE_OTHER): Payer: PPO

## 2018-03-21 DIAGNOSIS — R002 Palpitations: Secondary | ICD-10-CM | POA: Diagnosis not present

## 2018-03-22 NOTE — Progress Notes (Signed)
Carelink Summary Report / Loop Recorder 

## 2018-04-09 LAB — CUP PACEART REMOTE DEVICE CHECK
Date Time Interrogation Session: 20191105103812
MDC IDC PG IMPLANT DT: 20171120

## 2018-04-22 ENCOUNTER — Ambulatory Visit (INDEPENDENT_AMBULATORY_CARE_PROVIDER_SITE_OTHER): Payer: PPO

## 2018-04-22 DIAGNOSIS — R002 Palpitations: Secondary | ICD-10-CM

## 2018-04-23 LAB — CUP PACEART REMOTE DEVICE CHECK
MDC IDC PG IMPLANT DT: 20171120
MDC IDC SESS DTM: 20200110124041

## 2018-04-25 NOTE — Progress Notes (Signed)
Carelink Summary Report / Loop Recorder 

## 2018-05-01 LAB — CUP PACEART REMOTE DEVICE CHECK
Date Time Interrogation Session: 20191208113744
MDC IDC PG IMPLANT DT: 20171120

## 2018-05-25 ENCOUNTER — Ambulatory Visit (INDEPENDENT_AMBULATORY_CARE_PROVIDER_SITE_OTHER): Payer: PPO

## 2018-05-25 DIAGNOSIS — R002 Palpitations: Secondary | ICD-10-CM

## 2018-05-25 DIAGNOSIS — I480 Paroxysmal atrial fibrillation: Secondary | ICD-10-CM

## 2018-05-25 LAB — CUP PACEART REMOTE DEVICE CHECK
Implantable Pulse Generator Implant Date: 20171120
MDC IDC SESS DTM: 20200212123715

## 2018-06-06 NOTE — Progress Notes (Signed)
Carelink Summary Report / Loop Recorder 

## 2018-06-13 ENCOUNTER — Other Ambulatory Visit: Payer: Self-pay | Admitting: Cardiology

## 2018-06-27 ENCOUNTER — Other Ambulatory Visit: Payer: Self-pay

## 2018-06-27 ENCOUNTER — Ambulatory Visit (INDEPENDENT_AMBULATORY_CARE_PROVIDER_SITE_OTHER): Payer: PPO | Admitting: *Deleted

## 2018-06-27 DIAGNOSIS — R002 Palpitations: Secondary | ICD-10-CM | POA: Diagnosis not present

## 2018-06-28 LAB — CUP PACEART REMOTE DEVICE CHECK
Date Time Interrogation Session: 20200316124247
Implantable Pulse Generator Implant Date: 20171120

## 2018-07-05 NOTE — Progress Notes (Signed)
Carelink Summary Report / Loop Recorder 

## 2018-07-18 DIAGNOSIS — K625 Hemorrhage of anus and rectum: Secondary | ICD-10-CM | POA: Diagnosis not present

## 2018-07-18 DIAGNOSIS — J320 Chronic maxillary sinusitis: Secondary | ICD-10-CM | POA: Diagnosis not present

## 2018-07-25 ENCOUNTER — Telehealth (INDEPENDENT_AMBULATORY_CARE_PROVIDER_SITE_OTHER): Payer: PPO | Admitting: Gastroenterology

## 2018-07-25 ENCOUNTER — Telehealth: Payer: Self-pay | Admitting: *Deleted

## 2018-07-25 ENCOUNTER — Other Ambulatory Visit: Payer: Self-pay

## 2018-07-25 ENCOUNTER — Encounter: Payer: Self-pay | Admitting: Gastroenterology

## 2018-07-25 VITALS — BP 134/64 | Ht 68.0 in | Wt 193.0 lb

## 2018-07-25 DIAGNOSIS — K625 Hemorrhage of anus and rectum: Secondary | ICD-10-CM | POA: Diagnosis not present

## 2018-07-25 DIAGNOSIS — R1013 Epigastric pain: Secondary | ICD-10-CM

## 2018-07-25 MED ORDER — HYDROCORTISONE ACETATE 25 MG RE SUPP: 25.0000 mg | Freq: Two times a day (BID) | RECTAL | 4 refills | Status: DC

## 2018-07-25 NOTE — Progress Notes (Signed)
Chief Complaint:   Referring Provider:  Myrlene Broker, MD      ASSESSMENT AND PLAN;   #1. Rectal bleeding (resolved). D/d hoids, AVMs, colitis, polyps, stercoral ulcers etc, r/o colonic neoplasms or IBD. #2. Epigastric pain (better). Nl CBC, CMP and lipase. Neg [place] 03/2017. Has associated GERD. #3.  Paroxysmal A fib on Eliquis.  Plan: -Proceed with EGD/colon with MiraLAX prep. Discussed risks & benefits. (Risks including rare perforation req laparotomy, bleeding after biopsies/polypectomy req blood transfusion, rare chance of missing neoplasms, risks of anesthesia/sedation). -Cardiology clearence from Dr Curt Bears for EGD/colon and to hold eliquis 24hrs prior. -Continue nexium 40mg  po qd. -Continue metamucil.  -Anusol HC suppositories 1 BID PR after BMs x 7 days prn, 4 refills.  Please give generic.    HPI:    Vanessa [name] is a 68 y.o. female   With 2 episodes of rectal bleeding associated last week, with associated lower abdominal pain-resolved currently.  Denies having any significant diarrhea or constipation.  Had about one fourth of a cup of blood in the stool.  Had one episode of dark stools in the past.  Never had rectal bleeding before.  Occasional epigastric discomfort especially after eating.  Denies having any nausea/vomiting.  No fever or chills.  Seen by Kathrynn Ducking on 07/18/2018-advised GI work-up.  At that time she also had sinusitis and given cefdinir 300 mg p.o. twice daily for 10 days and Flonase.  Her sinusitis is much better.  No jaundice dark urine or pale stools.  No recent weight loss.  Last GI procedures: -Colonoscopy 09/2010 ([facility])-small internal hemorrhoids.  Otherwise normal. -EGD 2004- neg Past Medical History:  Diagnosis Date  . Anxiety   . AR (allergic rhinitis)   . Atrial fibrillation ([place])   . Depressed   . Glaucoma   . HTN (hypertension)   . Overweight(278.02)     Past Surgical History:  Procedure Laterality Date  . ABDOMINAL  HYSTERECTOMY    . CATARACT EXTRACTION    . catherization  11/00   normal  . cyrotherapy    . EP IMPLANTABLE DEVICE N/A 03/02/2016   Procedure: Loop Recorder Insertion;  Surgeon: Will [name], MD;  Location: [facility];  Service: Cardiovascular;  Laterality: N/A;  . EYE SURGERY     x2  . GLAUCOMA SURGERY     bilateral  . WISDOM TOOTH EXTRACTION      Family History  Problem Relation Age of Onset  . Stroke Mother   . Heart attack Father     Social History   Tobacco Use  . Smoking status: Never Smoker  . Smokeless tobacco: Never Used  Substance Use Topics  . Alcohol use: No  . Drug use: No    Current Outpatient Medications  Medication Sig Dispense Refill  . ELIQUIS 5 MG TABS tablet TAKE ONE TABLET BY MOUTH TWICE DAILY 180 tablet 2  . Garlic (GARLIQUE PO) Take 1 tablet by mouth daily.    . Multiple Vitamins-Minerals (OCUVITE PO) Take 1 tablet by mouth daily.    Marland Kitchen NEXIUM 40 MG capsule Take 40 mg by mouth daily before breakfast.     . valsartan-hydrochlorothiazide (DIOVAN-HCT) 160-12.5 MG tablet Take 1 tablet by mouth daily.    Marland Kitchen diltiazem (CARDIZEM CD) 180 MG 24 hr capsule Take 1 capsule (180 mg total) by mouth daily. 30 capsule 6  . Phendimetrazine Tartrate 105 MG CP24 TAKE ONE CAPSULE BY MOUTH DAILY    . rosuvastatin (CRESTOR) 10 MG  tablet Take 1 tablet (10 mg total) by mouth daily. 30 tablet 11   No current facility-administered medications for this visit.     Allergies  Allergen Reactions  . Codeine Nausea Only    nasuea    Review of Systems:  Constitutional: Denies fever, chills, diaphoresis, appetite change and fatigue.  HEENT: Had recent sinusitis which is better. Respiratory: Denies SOB, DOE, cough, chest tightness,  and wheezing.   Cardiovascular: Denies chest pain, palpitations and leg swelling.  Genitourinary: Denies dysuria, urgency, frequency, hematuria, flank pain and difficulty urinating.  Musculoskeletal: Denies myalgias, back pain,  joint swelling, arthralgias and gait problem.  Skin: No rash.  Neurological: Denies dizziness, seizures, syncope, weakness, light-headedness, numbness and headaches.  Hematological: Denies adenopathy. Easy bruising, personal or family bleeding history  Psychiatric/Behavioral: No anxiety or depression     Physical Exam:    BP 134/64   Ht 5\' 8"  (1.727 m)   Wt 193 lb (87.5 kg)   BMI 29.35 kg/m  Filed Weights   07/25/18 1223  Weight: 193 lb (87.5 kg)   Not examined since it was a tele-visit.  Data Reviewed: I have personally reviewed following labs and imaging studies  CBC: CBC Latest Ref Rng & Units 10/28/2016 02/19/2016  WBC 3.6 - 11.0 K/uL 10.0 8.1  Hemoglobin 12.0 - 16.0 g/dL 12.8 13.2  Hematocrit 35.0 - 47.0 % 37.7 38.8  Platelets 150 - 440 K/uL 202 184    CMP: CMP Latest Ref Rng & Units 11/29/2017 08/26/2017 10/28/2016  Glucose 65 - 99 mg/dL - 104(H) 164(H)  BUN 8 - 27 mg/dL - 20 21(H)  Creatinine 0.57 - 1.00 mg/dL - 1.00 1.22(H)  Sodium 134 - 144 mmol/L - 138 138  Potassium 3.5 - 5.2 mmol/L - 4.6 4.1  Chloride 96 - 106 mmol/L - 99 102  CO2 20 - 29 mmol/L - 25 27  Calcium 8.7 - 10.3 mg/dL - 10.0 9.1  Total Protein 6.0 - 8.5 g/dL 5.9(L) - 6.9  Total Bilirubin 0.0 - 1.2 mg/dL 0.6 - 1.0  Alkaline Phos 39 - 117 IU/L 65 - 56  AST 0 - 40 IU/L 17 - 22  ALT 0 - 32 IU/L 15 - 12(L)   This service was provided via telemedicine.  The patient was located at home.  The provider was located in office.  The patient did consent to this telephone visit and is aware of possible charges through their insurance for this visit.  The patient was referred by Dr. Unk Lightning.  Time spent on call and coordination of care: 25 min    Carmell Austria, MD 07/25/2018, 2:52 PM  Cc: Myrlene Broker, MD

## 2018-07-25 NOTE — Patient Instructions (Addendum)
Proceed with EGD/colon with MiraLAX prep. Discussed risks & benefits. (Risks including rare perforation req laparotomy, bleeding after biopsies/polypectomy req blood transfusion, rare chance of missing neoplasms, risks of anesthesia/sedation).  -Cardiology clearence from Dr Curt Bears for EGD/colon and to hold eliquis 24hrs prior.   -Continue nexium 40mg  po qd.  -Continue metamucil.   -Anusol HC suppositories 1 twice daily after the bowel movement for 7 days, 4 refills.  Please give generic. We will send to your pharmacy    You will be contacted by our office prior to your procedure for directions on holding your Eliquis.  If you do not hear from our office 1 week prior to your scheduled procedure, please call [phone] to discuss.   You have been scheduled for an endoscopy and colonoscopy. Please follow the written instructions given to you at your visit today. Please pick up your prep supplies at the pharmacy within the next 1-3 days. MIRALAX PREP IS  OVER THE COUNTER  If you use inhalers (even only as needed), please bring them with you on the day of your procedure.

## 2018-07-25 NOTE — Telephone Encounter (Signed)
[facility] Pre-operative Risk Assessment     Request for surgical clearance:     Endoscopy Procedure  What type of surgery is being performed?     Colonoscopy/Endoscopy  When is this surgery scheduled?     08/15/2018  What type of clearance is required ?   Pharmacy  Are there any medications that need to be held prior to surgery and how long? 24 hours   Practice name and name of physician performing surgery?      [facility]  What is your office phone and fax number?      Phone- [phone]  [phone]  Anesthesia type (None, local, MAC, general) ?       MAC

## 2018-07-26 NOTE — Telephone Encounter (Signed)
Patient with diagnosis of afib on Eliquis for anticoagulation.    Procedure: endosopy Date of procedure: 08/15/2018  CHADS2-VASc score of  4 (CHF, HTN, AGE, DM2, stroke/tia x 2, CAD, AGE, female)  Per office protocol, patient can hold Eliquis for 1 day prior to procedure.

## 2018-07-26 NOTE — Telephone Encounter (Signed)
Per Cardiology patient may hold Eliquis one day prior to procedure. Patient has been made aware and understood.

## 2018-07-26 NOTE — Telephone Encounter (Signed)
   Primary Cardiologist: Sherren Mocha, MD  Chart reviewed as part of pre-operative protocol coverage. Patient was contacted 07/26/2018 in reference to pre-operative risk assessment for pending surgery as outlined below.  Tomara Youngberg was last seen on 03/01/18 by Dr. Curt Bears.  Since that day, Mathew Storck has done well.  She is still recovering from a fall last year, but activity is not limited by anginal or other cardiac symptoms.   Per our pharmacy staff: Patient with diagnosis of afib on Eliquis for anticoagulation.    Procedure: endosopy Date of procedure: 08/15/2018  CHADS2-VASc score of  4 (CHF, HTN, AGE, DM2, stroke/tia x 2, CAD, AGE, female)  Per office protocol, patient can hold Eliquis for 1 day prior to procedure.    Therefore, based on ACC/AHA guidelines, the patient would be at acceptable risk for the planned procedure without further cardiovascular testing.   I will route this recommendation to the requesting party via Epic fax function and remove from pre-op pool.  Please call with questions.  [place], PA 07/26/2018, 10:08 AM

## 2018-08-01 ENCOUNTER — Ambulatory Visit (INDEPENDENT_AMBULATORY_CARE_PROVIDER_SITE_OTHER): Payer: PPO | Admitting: *Deleted

## 2018-08-01 ENCOUNTER — Encounter: Payer: Self-pay | Admitting: Gastroenterology

## 2018-08-01 ENCOUNTER — Other Ambulatory Visit: Payer: Self-pay

## 2018-08-01 ENCOUNTER — Telehealth: Payer: Self-pay

## 2018-08-01 DIAGNOSIS — R002 Palpitations: Secondary | ICD-10-CM | POA: Diagnosis not present

## 2018-08-01 LAB — CUP PACEART REMOTE DEVICE CHECK
Date Time Interrogation Session: 20200418123813
Implantable Pulse Generator Implant Date: 20171120

## 2018-08-01 NOTE — Telephone Encounter (Signed)
Pt called trying to send a manual transmission but was unsuccessful. She received the error code 2120. I gave her the number to carelink tech support.

## 2018-08-02 ENCOUNTER — Telehealth: Payer: Self-pay | Admitting: Cardiology

## 2018-08-02 NOTE — Telephone Encounter (Signed)
New Message:    Please call, said she need to talk to somebody in the [facility].o

## 2018-08-02 NOTE — Telephone Encounter (Signed)
Calling to inform us that she sent a transmission today for her 4/20 remote appt.

## 2018-08-09 NOTE — Progress Notes (Signed)
Carelink Summary Report / Loop Recorder 

## 2018-08-14 ENCOUNTER — Telehealth: Payer: Self-pay | Admitting: *Deleted

## 2018-08-14 NOTE — Telephone Encounter (Signed)
Covid-19 travel screening questions  Have you traveled in the last 14 days?no If yes where?  Do you now or have you had a fever in the last 14 days?no  Do you have any respiratory symptoms of shortness of breath or cough now or in the last 14 days?no  Do you have a medical history of Congestive Heart Failure?no  Do you have a medical history of lung disease?no  Do you have any family members or close contacts with diagnosed or suspected Covid-19?no   Pt aware of care partner policy and told to bring a mask with her if she has one. Pt verbalized understanding. SM

## 2018-08-15 ENCOUNTER — Other Ambulatory Visit: Payer: Self-pay

## 2018-08-15 ENCOUNTER — Encounter: Payer: Self-pay | Admitting: Gastroenterology

## 2018-08-15 ENCOUNTER — Ambulatory Visit (AMBULATORY_SURGERY_CENTER): Payer: PPO | Admitting: Gastroenterology

## 2018-08-15 ENCOUNTER — Telehealth: Payer: Self-pay | Admitting: *Deleted

## 2018-08-15 VITALS — BP 107/53 | HR 51 | Temp 98.4°F | Resp 11 | Ht 68.0 in | Wt 193.0 lb

## 2018-08-15 DIAGNOSIS — K625 Hemorrhage of anus and rectum: Secondary | ICD-10-CM

## 2018-08-15 DIAGNOSIS — R1013 Epigastric pain: Secondary | ICD-10-CM | POA: Diagnosis not present

## 2018-08-15 DIAGNOSIS — K449 Diaphragmatic hernia without obstruction or gangrene: Secondary | ICD-10-CM | POA: Diagnosis not present

## 2018-08-15 DIAGNOSIS — K648 Other hemorrhoids: Secondary | ICD-10-CM

## 2018-08-15 DIAGNOSIS — K2950 Unspecified chronic gastritis without bleeding: Secondary | ICD-10-CM | POA: Diagnosis not present

## 2018-08-15 DIAGNOSIS — K2970 Gastritis, unspecified, without bleeding: Secondary | ICD-10-CM | POA: Diagnosis not present

## 2018-08-15 DIAGNOSIS — I10 Essential (primary) hypertension: Secondary | ICD-10-CM | POA: Diagnosis not present

## 2018-08-15 DIAGNOSIS — K5730 Diverticulosis of large intestine without perforation or abscess without bleeding: Secondary | ICD-10-CM | POA: Diagnosis not present

## 2018-08-15 MED ORDER — SODIUM CHLORIDE 0.9 % IV SOLN: 500.0000 mL | Freq: Once | INTRAVENOUS | Status: DC

## 2018-08-15 NOTE — Progress Notes (Signed)
Called to room to assist during endoscopic procedure.  Patient ID and intended procedure confirmed with present staff. Received instructions for my participation in the procedure from the performing physician.  

## 2018-08-15 NOTE — Patient Instructions (Signed)
Resume eloquis at prior dose tommorrow. Handout on hemorrhoids, diverticulosis and hiatal hernia given. Continue Nexium 40 mg daily. Use preparation H twice daily after bowel movement for 7-10 days as needed    YOU HAD AN ENDOSCOPIC PROCEDURE TODAY AT [facility]:   Refer to the procedure report that was given to you for any specific questions about what was found during the examination.  If the procedure report does not answer your questions, please call your gastroenterologist to clarify.  If you requested that your care partner not be given the details of your procedure findings, then the procedure report has been included in a sealed envelope for you to review at your convenience later.  YOU SHOULD EXPECT: Some feelings of bloating in the abdomen. Passage of more gas than usual.  Walking can help get rid of the air that was put into your GI tract during the procedure and reduce the bloating. If you had a lower endoscopy (such as a colonoscopy or flexible sigmoidoscopy) you may notice spotting of blood in your stool or on the toilet paper. If you underwent a bowel prep for your procedure, you may not have a normal bowel movement for a few days.  Please Note:  You might notice some irritation and congestion in your nose or some drainage.  This is from the oxygen used during your procedure.  There is no need for concern and it should clear up in a day or so.  SYMPTOMS TO REPORT IMMEDIATELY:   Following lower endoscopy (colonoscopy or flexible sigmoidoscopy):  Excessive amounts of blood in the stool  Significant tenderness or worsening of abdominal pains  Swelling of the abdomen that is new, acute  Fever of 100F or higher   Following upper endoscopy (EGD)  Vomiting of blood or coffee ground material  New chest pain or pain under the shoulder blades  Painful or persistently difficult swallowing  New shortness of breath  Fever of 100F or higher  Black, tarry-looking  stools  For urgent or emergent issues, a gastroenterologist can be reached at any hour by calling [phone].   DIET:  We do recommend a small meal at first, but then you may proceed to your regular diet.  Drink plenty of fluids but you should avoid alcoholic beverages for 24 hours.  ACTIVITY:  You should plan to take it easy for the rest of today and you should NOT DRIVE or use heavy machinery until tomorrow (because of the sedation medicines used during the test).    FOLLOW UP: Our staff will call the number listed on your records the next business day following your procedure to check on you and address any questions or concerns that you may have regarding the information given to you following your procedure. If we do not reach you, we will leave a message.  However, if you are feeling well and you are not experiencing any problems, there is no need to return our call.  We will assume that you have returned to your regular daily activities without incident.  If any biopsies were taken you will be contacted by phone or by letter within the next 1-3 weeks.  Please call us at [phone] if you have not heard about the biopsies in 3 weeks.    SIGNATURES/CONFIDENTIALITY: You and/or your care partner have signed paperwork which will be entered into your electronic medical record.  These signatures attest to the fact that that the information above on your After Visit  Summary has been reviewed and is understood.  Full responsibility of the confidentiality of this discharge information lies with you and/or your care-partner. 

## 2018-08-15 NOTE — Progress Notes (Signed)
Report given to PACU, vss 

## 2018-08-15 NOTE — Op Note (Signed)
[facility] Patient Name: Vanessa [name] Procedure Date: 08/15/2018 8:24 AM MRN: [id number] Endoscopist: Jackquline Denmark , MD Age: 68 Referring MD:  Date of Birth: 07/24/1950 Gender: Female Account #: [id number] Procedure:                Colonoscopy Indications:              Rectal bleeding Medicines:                Monitored Anesthesia Care Procedure:                Pre-Anesthesia Assessment:                           - Prior to the procedure, a History and Physical                            was performed, and patient medications and                            allergies were reviewed. The patient's tolerance of                            previous anesthesia was also reviewed. The risks                            and benefits of the procedure and the sedation                            options and risks were discussed with the patient.                            All questions were answered, and informed consent                            was obtained. Prior Anticoagulants: The patient has                            taken Eliquis (apixaban), last dose was 2 days                            prior to procedure. ASA Grade Assessment: II - A                            patient with mild systemic disease. After reviewing                            the risks and benefits, the patient was deemed in                            satisfactory condition to undergo the procedure.                           After obtaining informed consent, the colonoscope  was passed under direct vision. Throughout the                            procedure, the patient's blood pressure, pulse, and                            oxygen saturations were monitored continuously. The                            Colonoscope was introduced through the anus and                            advanced to the 2 cm into the ileum. The                            colonoscopy was performed without difficulty.  The                            patient tolerated the procedure well. The quality                            of the bowel preparation was excellent. The                            terminal ileum, ileocecal valve, appendiceal                            orifice, and rectum were photographed. Scope In: 8:37:01 AM Scope Out: 8:48:07 AM Scope Withdrawal Time: 0 hours 6 minutes 43 seconds  Total Procedure Duration: 0 hours 11 minutes 6 seconds  Findings:                 A few small-mouthed diverticula were found in the                            sigmoid colon and ascending colon.                           Non-bleeding internal hemorrhoids were found during                            retroflexion. The hemorrhoids were small.                           The exam was otherwise without abnormality on                            direct and retroflexion views.                           The terminal ileum appeared normal. Complications:            No immediate complications. Estimated Blood Loss:     Estimated blood loss: none. Impression:               - Mild pancolonic diverticulosis                           -  Small internal hemorrhoids (likely etiology of                            rectal bleeding). No active bleeding.                           - Otherwise normal colonoscopy to TI. Recommendation:           - Patient has a contact number available for                            emergencies. The signs and symptoms of potential                            delayed complications were discussed with the                            patient. Return to normal activities tomorrow.                            Written discharge instructions were provided to the                            patient.                           - Resume previous diet.                           - Resume Eliquis (apixaban) at prior dose tomorrow.                           - Repeat colonoscopy in 10 years for screening                             purposes.                           - Use Preparation H 1 twice daily after the bowel                            movement for 7 to 10 days PRN.                           - Return to GI clinic PRN. Jackquline Denmark, MD 08/15/2018 9:01:07 AM This report has been signed electronically.

## 2018-08-15 NOTE — Op Note (Signed)
[facility] Patient Name: Vanessa [name] Procedure Date: 08/15/2018 8:24 AM MRN: [id number] Endoscopist: Jackquline Denmark , MD Age: 68 Referring MD:  Date of Birth: [birth year] Gender: Female Account #: [id number] Procedure:                Upper GI endoscopy Indications:              Epigastric abdominal pain Medicines:                Monitored Anesthesia Care Procedure:                Pre-Anesthesia Assessment:                           - Prior to the procedure, a History and Physical                            was performed, and patient medications and                            allergies were reviewed. The patient's tolerance of                            previous anesthesia was also reviewed. The risks                            and benefits of the procedure and the sedation                            options and risks were discussed with the patient.                            All questions were answered, and informed consent                            was obtained. Prior Anticoagulants: The patient has                            taken Eliquis (apixaban), last dose was 2 days                            prior to procedure. ASA Grade Assessment: II - A                            patient with mild systemic disease. After reviewing                            the risks and benefits, the patient was deemed in                            satisfactory condition to undergo the procedure.                           After obtaining informed consent, the endoscope was  passed under direct vision. Throughout the                            procedure, the patient's blood pressure, pulse, and                            oxygen saturations were monitored continuously. The                            Endoscope was introduced through the mouth, and                            advanced to the second part of duodenum. The upper                            GI endoscopy was  accomplished without difficulty.                            The patient tolerated the procedure well. Scope In: Scope Out: Findings:                 The distal esophagus was mildly tortuous d/t HH.                           The Z-line was regular and was found 36 cm from the                            incisors.                           A 2 cm hiatal hernia was present.                           Localized mild inflammation was found in the                            gastric antrum. Biopsies were taken with a cold                            forceps for histology. Estimated blood loss: none.                            Retained fluid was suctioned out.                           The examined duodenum was normal. Biopsies for                            histology were taken with a cold forceps for                            evaluation of celiac disease. Estimated blood loss:  none. Complications:            No immediate complications. Estimated Blood Loss:     Estimated blood loss: none. Impression:               - Small hiatal hernia.                           - Mild gastritis. Biopsied. Recommendation:           - Patient has a contact number available for                            emergencies. The signs and symptoms of potential                            delayed complications were discussed with the                            patient. Return to normal activities tomorrow.                            Written discharge instructions were provided to the                            patient.                           - Resume previous diet.                           - Continue present medications including Nexium 40                            mg p.o. once a day.                           - Resume Eliquis (apixaban) at prior dose tomorrow.                           - Await pathology results.                           - Return to GI clinic in 8 weeks. If still with                             problems, consider solid-phase gastric emptying                            scan. Jackquline Denmark, MD 08/15/2018 8:55:27 AM This report has been signed electronically.

## 2018-08-16 ENCOUNTER — Telehealth: Payer: Self-pay

## 2018-08-16 NOTE — Telephone Encounter (Signed)
  Follow up Call-  Call back number 08/15/2018  Post procedure Call Back phone  # [phone]  Permission to leave phone message Yes  Some recent data might be hidden     Patient questions:  Do you have a fever, pain , or abdominal swelling? No. Pain Score  0 *  Have you tolerated food without any problems? Yes.    Have you been able to return to your normal activities? Yes.    Do you have any questions about your discharge instructions: Diet   No. Medications  No. Follow up visit  No.  Do you have questions or concerns about your Care? No.  Actions: * If pain score is 4 or above: No action needed, pain <4.

## 2018-08-17 NOTE — Telephone Encounter (Signed)

## 2018-08-18 ENCOUNTER — Encounter: Payer: Self-pay | Admitting: Gastroenterology

## 2018-08-19 ENCOUNTER — Telehealth: Payer: Self-pay

## 2018-08-19 NOTE — Telephone Encounter (Signed)
Unable to leave a message for patient to remind of disconnected monitor

## 2018-08-20 ENCOUNTER — Other Ambulatory Visit: Payer: Self-pay | Admitting: Cardiology

## 2018-08-23 ENCOUNTER — Telehealth (INDEPENDENT_AMBULATORY_CARE_PROVIDER_SITE_OTHER): Payer: PPO | Admitting: Cardiology

## 2018-08-23 ENCOUNTER — Encounter: Payer: Self-pay | Admitting: Cardiology

## 2018-08-23 ENCOUNTER — Other Ambulatory Visit: Payer: Self-pay

## 2018-08-23 VITALS — BP 146/64 | HR 56 | Wt 192.8 lb

## 2018-08-23 DIAGNOSIS — I480 Paroxysmal atrial fibrillation: Secondary | ICD-10-CM

## 2018-08-23 NOTE — Progress Notes (Signed)
Electrophysiology TeleHealth Note   Due to national recommendations of social distancing due to COVID 19, an audio/video telehealth visit is felt to be most appropriate for this patient at this time.  See Epic message for the patient's consent to telehealth for [facility].   Date:  08/23/2018   ID:  Vanessa [name], DOB 05-01-1950, MRN [id number]  Location: patient's home  Provider location: [address] [place]  Evaluation Performed: Follow-up visit  PCP:  Myrlene Broker, MD  Cardiologist:  Sherren Mocha, MD  Electrophysiologist:  Dr Curt Bears  Chief Complaint:  Atrial fibrillation  History of Present Illness:    Vanessa [name] is a 68 y.o. female who presents via audio/video conferencing for a telehealth visit today.  Since last being seen in our clinic, the patient reports doing very well.  Today, she denies symptoms of palpitations, chest pain, shortness of breath,  lower extremity edema, dizziness, presyncope, or syncope.  The patient is otherwise without complaint today.  The patient denies symptoms of fevers, chills, cough, or new SOB worrisome for COVID 19.  She has a history of atrial fibrillation, hypertension, hyperlipidemia.  She has a Linq monitor implanted prior to her diagnosis of AF for palpitations.  Today, denies symptoms of palpitations, chest pain, shortness of breath, orthopnea, PND, lower extremity edema, claudication, dizziness, presyncope, syncope, bleeding, or neurologic sequela. The patient is tolerating medications without difficulties. She has felt well since last being seen. She is able to do all her activities without issues.    Past Medical History:  Diagnosis Date  . Anxiety   . AR (allergic rhinitis)   . Arthritis    knees  . Atrial fibrillation ([place])   . Cataract    bilateral /removed  . Depressed   . Glaucoma   . HTN (hypertension)   . Hyperlipidemia   . Overweight(278.02)     Past Surgical History:  Procedure  Laterality Date  . ABDOMINAL HYSTERECTOMY    . CATARACT EXTRACTION    . catherization  11/00   normal  . COLONOSCOPY    . cyrotherapy    . EP IMPLANTABLE DEVICE N/A 03/02/2016   Procedure: Loop Recorder Insertion;  Surgeon: Carsyn Taubman [name], MD;  Location: [facility];  Service: Cardiovascular;  Laterality: N/A;  . EYE SURGERY     x2  . GLAUCOMA SURGERY     bilateral  . KNEE ARTHROSCOPY     left knee  . ROTATOR CUFF REPAIR     right shoulder  . WISDOM TOOTH EXTRACTION      Current Outpatient Medications  Medication Sig Dispense Refill  . Ascorbic Acid (VITAMIN C) 1000 MG tablet Take 1,000 mg by mouth daily.    Marland Kitchen diltiazem (CARDIZEM CD) 180 MG 24 hr capsule Take 1 capsule (180 mg total) by mouth daily. 30 capsule 6  . diltiazem (CARDIZEM CD) 180 MG 24 hr capsule     . ELIQUIS 5 MG TABS tablet TAKE ONE TABLET BY MOUTH TWICE DAILY 180 tablet 2  . Garlic (GARLIQUE PO) Take 1 tablet by mouth daily.    . hydrocortisone (ANUSOL-HC) 25 MG suppository Place 1 suppository (25 mg total) rectally every 12 (twelve) hours. After bowel movements for 7 days (Patient not taking: Reported on 08/15/2018) 14 suppository 4  . Multiple Vitamins-Minerals (OCUVITE PO) Take 1 tablet by mouth daily.    Marland Kitchen NEXIUM 40 MG capsule Take 40 mg by mouth daily before breakfast.     .  Phendimetrazine Tartrate 105 MG CP24 TAKE ONE CAPSULE BY MOUTH DAILY    . rosuvastatin (CRESTOR) 10 MG tablet     . rosuvastatin (CRESTOR) 10 MG tablet TAKE 1 TABLET BY MOUTH ONCE DAILY 30 tablet 6  . valsartan-hydrochlorothiazide (DIOVAN-HCT) 160-12.5 MG tablet Take 1 tablet by mouth daily.    . Zinc 50 MG CAPS Take by mouth.     No current facility-administered medications for this visit.     Allergies:   Codeine   Social History:  The patient  reports that she has never smoked. She has never used smokeless tobacco. She reports that she does not drink alcohol or use drugs.   Family History:  The patient's  family  history includes Heart attack in her father; Stroke in her mother.   ROS:  Please see the history of present illness.   All other systems are personally reviewed and negative.    Exam:    Vital Signs:  There were no vitals taken for this visit.  Over the phone, no acute distress, no shortness of breath.  Labs/Other Tests and Data Reviewed:    Recent Labs: 08/26/2017: BUN 20; Creatinine, Ser 1.00; Potassium 4.6; Sodium 138 11/29/2017: ALT 15   Wt Readings from Last 3 Encounters:  08/15/18 193 lb (87.5 kg)  07/25/18 193 lb (87.5 kg)  03/01/18 193 lb (87.5 kg)     Other studies personally reviewed: Additional studies/ records that were reviewed today include: ECG 03/01/2018 Review of the above records today demonstrates: Sinus rhythm  Last device remote is reviewed from [facility] PDF dated 08/01/18 which reveals normal device function, no arrhythmias    ASSESSMENT & PLAN:    1.  Paroxysmal atrial fibrillation: Currently on Eliquis.  Currently not on antiarrhythmics. Feeling well without compliant. No changes.  This patients CHA2DS2-VASc Score and unadjusted Ischemic Stroke Rate (% per year) is equal to 3.2 % stroke rate/year from a score of 3  Above score calculated as 1 point each if present [CHF, HTN, DM, Vascular=MI/PAD/Aortic Plaque, Age if 65-74, or Female] Above score calculated as 2 points each if present [Age > 75, or Stroke/TIA/TE]  2.  Hypertension: BP elevated today but has been better controlled in the past. Myk[place] make no changes.   COVID 19 screen The patient denies symptoms of COVID 19 at this time.  The importance of social distancing was discussed today.  Follow-up:  6 months  Current medicines are reviewed at length with the patient today.   The patient does not have concerns regarding her medicines.  The following changes were made today:  none  Labs/ tests ordered today include:  No orders of the defined types were placed in this encounter.    Patient  Risk:  after full review of this patients clinical status, I feel that they are at moderate risk at this time.  Today, I have spent 12 minutes with the patient with telehealth technology discussing atrial fibrillation.    Signed, Kienna Moncada [name], MD  08/23/2018 9:30 AM     [facility] [address] [place] [address] [phone] (office) [phone] (fax)

## 2018-08-24 ENCOUNTER — Telehealth: Payer: Self-pay | Admitting: *Deleted

## 2018-08-24 NOTE — Telephone Encounter (Signed)
1. Have you developed a fever since your procedure? No.  2.   Have you had an respiratory symptoms (SOB or cough) since your procedure? No.  3.   Have you tested positive for COVID 19 since your procedure No.  3.   Have you had any family members/close contacts diagnosed with the COVID 19 since your procedure?  No   If any of these questions are a yes, please inquire if patient has been seen by family doctor and route this note to Elisheba Mcdonnell Walton, RN. 

## 2018-09-01 ENCOUNTER — Ambulatory Visit (INDEPENDENT_AMBULATORY_CARE_PROVIDER_SITE_OTHER): Payer: PPO | Admitting: *Deleted

## 2018-09-01 ENCOUNTER — Telehealth: Payer: Self-pay | Admitting: Cardiology

## 2018-09-01 DIAGNOSIS — I480 Paroxysmal atrial fibrillation: Secondary | ICD-10-CM | POA: Diagnosis not present

## 2018-09-01 LAB — CUP PACEART REMOTE DEVICE CHECK
Date Time Interrogation Session: 20200521110247
Implantable Pulse Generator Implant Date: 20171120

## 2018-09-01 NOTE — Telephone Encounter (Signed)
Per pt call unable to pick up a signal today and will try again when the weather clears up.  Please give her a call with any questions.

## 2018-09-12 NOTE — Progress Notes (Signed)
Carelink Summary Report / Loop Recorder 

## 2018-09-19 ENCOUNTER — Other Ambulatory Visit: Payer: Self-pay | Admitting: Cardiology

## 2018-09-20 ENCOUNTER — Telehealth: Payer: Self-pay | Admitting: Gastroenterology

## 2018-09-20 NOTE — Telephone Encounter (Signed)
Please advise 

## 2018-09-20 NOTE — Telephone Encounter (Signed)
Pt requested Dr. Lyndel Safe to prescribe Nexium for her since she cannot get an appt with her PCP.

## 2018-09-21 MED ORDER — ESOMEPRAZOLE MAGNESIUM 40 MG PO CPDR: 40.0000 mg | DELAYED_RELEASE_CAPSULE | Freq: Every day | ORAL | 6 refills | Status: DC

## 2018-09-21 NOTE — Telephone Encounter (Signed)
Sent prescription to patients pharmacy.  

## 2018-09-21 NOTE — Telephone Encounter (Signed)
Please do Nexium 40 mg p.o. once a day, 30, 6 refills

## 2018-09-28 ENCOUNTER — Other Ambulatory Visit: Payer: Self-pay | Admitting: Cardiology

## 2018-09-29 ENCOUNTER — Other Ambulatory Visit: Payer: Self-pay | Admitting: Cardiology

## 2018-09-29 NOTE — Telephone Encounter (Signed)
Pt calling requesting a refill on Eliquis sent to [facility]. Pt stated that she needs this medication today, pt is out of this medication. Please address

## 2018-09-29 NOTE — Telephone Encounter (Signed)
Please refer to Eliquis refill encounter from today.

## 2018-09-29 NOTE — Telephone Encounter (Addendum)
Prescription refill request for Eliquis received.  Last office visit: Dr. Curt Bears-  Video visit (08/23/2018) Scr: 1.0 ( 08/26/2017 via [place]) Age: 68 yrs old Weight: 87.5 kg (03/01/2018)  Pt is over due for labs. Called pt's pcp who stated her last blood work was in April of last year.Called pt and made her aware that she needs lab work to refill prescription. Pt stated she is going to her PCP office 10/13/2018 and should be getting blood work. Will refill a 30 day supply of Eliquis.

## 2018-10-04 ENCOUNTER — Encounter: Payer: PPO | Admitting: *Deleted

## 2018-10-04 ENCOUNTER — Telehealth: Payer: Self-pay | Admitting: Cardiology

## 2018-10-04 LAB — CUP PACEART REMOTE DEVICE CHECK
Date Time Interrogation Session: 20200623113540
Implantable Pulse Generator Implant Date: 20171120

## 2018-10-04 NOTE — Telephone Encounter (Signed)
I let the pt know we got the message about she getting a new monitor. She is expecting the new monitor in about 10 days.

## 2018-10-04 NOTE — Telephone Encounter (Signed)
New message:    Patient states she was suppose to send a transmisson she had to send the old monitor  back and she will be receiving the new one in about 10 days.

## 2018-10-13 DIAGNOSIS — Z1211 Encounter for screening for malignant neoplasm of colon: Secondary | ICD-10-CM | POA: Diagnosis not present

## 2018-10-13 DIAGNOSIS — I480 Paroxysmal atrial fibrillation: Secondary | ICD-10-CM | POA: Diagnosis not present

## 2018-10-13 DIAGNOSIS — Z0000 Encounter for general adult medical examination without abnormal findings: Secondary | ICD-10-CM | POA: Diagnosis not present

## 2018-10-13 DIAGNOSIS — Z23 Encounter for immunization: Secondary | ICD-10-CM | POA: Diagnosis not present

## 2018-10-13 DIAGNOSIS — E782 Mixed hyperlipidemia: Secondary | ICD-10-CM | POA: Diagnosis not present

## 2018-10-13 DIAGNOSIS — R8281 Pyuria: Secondary | ICD-10-CM | POA: Diagnosis not present

## 2018-10-13 DIAGNOSIS — K219 Gastro-esophageal reflux disease without esophagitis: Secondary | ICD-10-CM | POA: Diagnosis not present

## 2018-10-13 DIAGNOSIS — H6121 Impacted cerumen, right ear: Secondary | ICD-10-CM | POA: Diagnosis not present

## 2018-10-13 DIAGNOSIS — Z1159 Encounter for screening for other viral diseases: Secondary | ICD-10-CM | POA: Diagnosis not present

## 2018-10-13 DIAGNOSIS — I10 Essential (primary) hypertension: Secondary | ICD-10-CM | POA: Diagnosis not present

## 2018-10-25 ENCOUNTER — Encounter: Payer: PPO | Admitting: *Deleted

## 2018-10-27 ENCOUNTER — Other Ambulatory Visit: Payer: Self-pay | Admitting: Cardiology

## 2018-10-27 NOTE — Telephone Encounter (Signed)
Prescription refill request for Eliquis received.  Last office visit: Dr. Curt Bears (08-23-2018) Scr: 1.03 (10-13-2018) Age: 68 y.o. Weight: 87.5 kg ( 03-01-2018)  Prescription refill sent.

## 2018-11-07 ENCOUNTER — Ambulatory Visit (INDEPENDENT_AMBULATORY_CARE_PROVIDER_SITE_OTHER): Payer: PPO | Admitting: *Deleted

## 2018-11-07 DIAGNOSIS — I471 Supraventricular tachycardia: Secondary | ICD-10-CM

## 2018-11-07 DIAGNOSIS — I4891 Unspecified atrial fibrillation: Secondary | ICD-10-CM

## 2018-11-07 LAB — CUP PACEART REMOTE DEVICE CHECK
Date Time Interrogation Session: 20200726141202
Implantable Pulse Generator Implant Date: 20171120

## 2018-11-09 DIAGNOSIS — D1801 Hemangioma of skin and subcutaneous tissue: Secondary | ICD-10-CM | POA: Diagnosis not present

## 2018-11-09 DIAGNOSIS — D225 Melanocytic nevi of trunk: Secondary | ICD-10-CM | POA: Diagnosis not present

## 2018-11-09 DIAGNOSIS — L578 Other skin changes due to chronic exposure to nonionizing radiation: Secondary | ICD-10-CM | POA: Diagnosis not present

## 2018-11-09 DIAGNOSIS — L570 Actinic keratosis: Secondary | ICD-10-CM | POA: Diagnosis not present

## 2018-11-09 DIAGNOSIS — D485 Neoplasm of uncertain behavior of skin: Secondary | ICD-10-CM | POA: Diagnosis not present

## 2018-11-09 DIAGNOSIS — L820 Inflamed seborrheic keratosis: Secondary | ICD-10-CM | POA: Diagnosis not present

## 2018-11-23 NOTE — Progress Notes (Signed)
Carelink Summary Report / Loop Recorder 

## 2018-11-24 DIAGNOSIS — D485 Neoplasm of uncertain behavior of skin: Secondary | ICD-10-CM | POA: Diagnosis not present

## 2018-12-09 ENCOUNTER — Ambulatory Visit (INDEPENDENT_AMBULATORY_CARE_PROVIDER_SITE_OTHER): Payer: PPO | Admitting: *Deleted

## 2018-12-09 DIAGNOSIS — I4891 Unspecified atrial fibrillation: Secondary | ICD-10-CM

## 2018-12-09 LAB — CUP PACEART REMOTE DEVICE CHECK
Date Time Interrogation Session: 20200828131103
Implantable Pulse Generator Implant Date: 20171120

## 2018-12-13 ENCOUNTER — Telehealth: Payer: Self-pay | Admitting: Cardiology

## 2018-12-13 NOTE — Progress Notes (Signed)
Carelink Summary Report / Loop Recorder 

## 2018-12-13 NOTE — Telephone Encounter (Signed)
Patient called. She was supposed to do a home remote transmission on 08/28. She sent a transmission to the office on 09/01 around 4:30 pm. She called to make sure the office got her transmission.

## 2018-12-14 NOTE — Telephone Encounter (Signed)
Spoke to the patient and let her know the transmission was received.

## 2019-01-11 ENCOUNTER — Ambulatory Visit (INDEPENDENT_AMBULATORY_CARE_PROVIDER_SITE_OTHER): Payer: PPO | Admitting: *Deleted

## 2019-01-11 DIAGNOSIS — R002 Palpitations: Secondary | ICD-10-CM | POA: Diagnosis not present

## 2019-01-11 DIAGNOSIS — Z1231 Encounter for screening mammogram for malignant neoplasm of breast: Secondary | ICD-10-CM | POA: Diagnosis not present

## 2019-01-11 LAB — CUP PACEART REMOTE DEVICE CHECK
Date Time Interrogation Session: 20200930144317
Date Time Interrogation Session: 20200930184116
Implantable Pulse Generator Implant Date: 20171120
Implantable Pulse Generator Implant Date: 20171120

## 2019-01-17 NOTE — Progress Notes (Signed)
Carelink Summary Report / Loop Recorder 

## 2019-02-04 DIAGNOSIS — J209 Acute bronchitis, unspecified: Secondary | ICD-10-CM | POA: Diagnosis not present

## 2019-02-04 DIAGNOSIS — R519 Headache, unspecified: Secondary | ICD-10-CM | POA: Diagnosis not present

## 2019-02-04 DIAGNOSIS — Z20828 Contact with and (suspected) exposure to other viral communicable diseases: Secondary | ICD-10-CM | POA: Diagnosis not present

## 2019-02-13 ENCOUNTER — Ambulatory Visit (INDEPENDENT_AMBULATORY_CARE_PROVIDER_SITE_OTHER): Payer: PPO | Admitting: *Deleted

## 2019-02-13 DIAGNOSIS — I4891 Unspecified atrial fibrillation: Secondary | ICD-10-CM

## 2019-02-13 DIAGNOSIS — I471 Supraventricular tachycardia: Secondary | ICD-10-CM

## 2019-02-13 LAB — CUP PACEART REMOTE DEVICE CHECK
Date Time Interrogation Session: 20201102123912
Date Time Interrogation Session: 20201102120624
Implantable Pulse Generator Implant Date: 20171120
Implantable Pulse Generator Implant Date: 20171120

## 2019-02-20 DIAGNOSIS — H402221 Chronic angle-closure glaucoma, left eye, mild stage: Secondary | ICD-10-CM | POA: Diagnosis not present

## 2019-02-20 DIAGNOSIS — H402212 Chronic angle-closure glaucoma, right eye, moderate stage: Secondary | ICD-10-CM | POA: Diagnosis not present

## 2019-03-08 NOTE — Progress Notes (Signed)
Carelink Summary Report / Loop Recorder 

## 2019-03-20 ENCOUNTER — Ambulatory Visit (INDEPENDENT_AMBULATORY_CARE_PROVIDER_SITE_OTHER): Payer: PPO | Admitting: *Deleted

## 2019-03-20 DIAGNOSIS — I471 Supraventricular tachycardia: Secondary | ICD-10-CM | POA: Diagnosis not present

## 2019-03-20 LAB — CUP PACEART REMOTE DEVICE CHECK
Date Time Interrogation Session: 20201207104605
Implantable Pulse Generator Implant Date: 20171120

## 2019-04-05 ENCOUNTER — Encounter: Payer: Self-pay | Admitting: Cardiology

## 2019-04-05 ENCOUNTER — Ambulatory Visit (INDEPENDENT_AMBULATORY_CARE_PROVIDER_SITE_OTHER): Payer: PPO | Admitting: Cardiology

## 2019-04-05 ENCOUNTER — Other Ambulatory Visit: Payer: Self-pay

## 2019-04-05 VITALS — BP 126/62 | HR 61 | Ht 68.0 in | Wt 190.0 lb

## 2019-04-05 DIAGNOSIS — I480 Paroxysmal atrial fibrillation: Secondary | ICD-10-CM | POA: Diagnosis not present

## 2019-04-05 DIAGNOSIS — R002 Palpitations: Secondary | ICD-10-CM

## 2019-04-05 NOTE — Progress Notes (Signed)
Electrophysiology Office Note   Date:  04/05/2019   ID:  Vanessa [name], DOB 05-Oct-1950, MRN [id number]  PCP:  Myrlene Broker, MD  Cardiologist:  Burt Knack Primary Electrophysiologist:  Jarin Cornfield [name], MD    No chief complaint on file.    History of Present Illness: Vanessa [name] is a 68 y.o. female who presents today for electrophysiology evaluation.   History of exertional pain w/ neg MV 2013, FH CAD, HTN, HLD. Had a LINQ implanted for palpitations found to be in atrial fibrillation. She says that she has had a few further episodes of palpitations since last being seen. She has a 3% burden on her Linq interrogation.  Today, denies symptoms of palpitations, chest pain, shortness of breath, orthopnea, PND, lower extremity edema, claudication, dizziness, presyncope, syncope, bleeding, or neurologic sequela. The patient is tolerating medications without difficulties.  Overall she is doing well.  She is concerned that her PACs and atrial fibrillation Vanessa [name] cause her significant issues.  I have explained to her that these are not life-threatening arrhythmias.   Past Medical History:  Diagnosis Date  . Anxiety   . AR (allergic rhinitis)   . Arthritis    knees  . Atrial fibrillation ([place])   . Cataract    bilateral /removed  . Depressed   . Glaucoma   . HTN (hypertension)   . Hyperlipidemia   . Overweight(278.02)    Past Surgical History:  Procedure Laterality Date  . ABDOMINAL HYSTERECTOMY    . CATARACT EXTRACTION    . catherization  11/00   normal  . COLONOSCOPY    . cyrotherapy    . EP IMPLANTABLE DEVICE N/A 03/02/2016   Procedure: Loop Recorder Insertion;  Surgeon: Kieran Nachtigal [name], MD;  Location: [facility];  Service: Cardiovascular;  Laterality: N/A;  . EYE SURGERY     x2  . GLAUCOMA SURGERY     bilateral  . KNEE ARTHROSCOPY     left knee  . ROTATOR CUFF REPAIR     right shoulder  . WISDOM TOOTH EXTRACTION       Current Outpatient Medications    Medication Sig Dispense Refill  . Ascorbic Acid (VITAMIN C) 1000 MG tablet Take 1,000 mg by mouth daily.    . Cholecalciferol (VITAMIN D3 MAXIMUM STRENGTH) 125 MCG (5000 UT) capsule Take 5,000 Units by mouth daily.    Marland Kitchen diltiazem (CARDIZEM CD) 180 MG 24 hr capsule TAKE 1 CAPSULE BY MOUTH ONCE DAILY 30 capsule 10  . ELIQUIS 5 MG TABS tablet TAKE 1 TABLET BY MOUTH TWICE(2) DAILY 60 tablet 5  . esomeprazole (NEXIUM) 40 MG capsule Take 1 capsule (40 mg total) by mouth daily. 30 capsule 6  . Garlic (GARLIQUE PO) Take 1 tablet by mouth daily.    . Multiple Vitamins-Minerals (OCUVITE PO) Take 1 tablet by mouth daily.    . Phendimetrazine Tartrate 105 MG CP24 TAKE ONE CAPSULE BY MOUTH DAILY    . rosuvastatin (CRESTOR) 10 MG tablet     . rosuvastatin (CRESTOR) 10 MG tablet TAKE 1 TABLET BY MOUTH ONCE DAILY 30 tablet 6  . valsartan-hydrochlorothiazide (DIOVAN-HCT) 160-12.5 MG tablet Take 1 tablet by mouth daily.    . Zinc 50 MG CAPS Take by mouth.     No current facility-administered medications for this visit.    Allergies:   Codeine   Social History:  The patient  reports that she has never smoked. She has never used smokeless tobacco. She reports that she does not  drink alcohol or use drugs.   Family History:  The patient's family history includes Heart attack in her father; Stroke in her mother.   ROS:  Please see the history of present illness.   Otherwise, review of systems is positive for none.   All other systems are reviewed and negative.   PHYSICAL EXAM: VS:  BP 126/62   Pulse 61   Ht 5\' 8"  (1.727 m)   Wt 190 lb (86.2 kg)   SpO2 97%   BMI 28.89 kg/m  , BMI Body mass index is 28.89 kg/m. GEN: Well nourished, well developed, in no acute distress  HEENT: normal  Neck: no JVD, carotid bruits, or masses Cardiac: RRR; no murmurs, rubs, or gallops,no edema  Respiratory:  clear to auscultation bilaterally, normal work of breathing GI: soft, nontender, nondistended, + BS MS: no  deformity or atrophy  Skin: warm and dry, device site well healed Neuro:  Strength and sensation are intact Psych: euthymic mood, full affect  EKG:  EKG is ordered today. Personal review of the ekg ordered shows sinus rhythm, rate 61  Personal review of the device interrogation today. Results in [place]: No results found for requested labs within last 8760 hours.    Lipid Panel     Component Value Date/Time   CHOL 113 11/29/2017 0744   TRIG 102 11/29/2017 0744   HDL 41 11/29/2017 0744   CHOLHDL 2.8 11/29/2017 0744   LDLCALC 52 11/29/2017 0744     Wt Readings from Last 3 Encounters:  04/05/19 190 lb (86.2 kg)  08/23/18 192 lb 12.8 oz (87.5 kg)  08/15/18 193 lb (87.5 kg)      Other studies Reviewed: Additional studies/ records that were reviewed today include: TTE 02/19/16  Review of the above records today demonstrates:  - Left ventricle: The cavity size was normal. There was mild focal   basal hypertrophy of the septum. Systolic function was normal.   The estimated ejection fraction was in the range of 55% to 60%.   Wall motion was normal; there were no regional wall motion   abnormalities. Doppler parameters are consistent with abnormal   left ventricular relaxation (grade 1 diastolic dysfunction). - Mitral valve: There was mild regurgitation. - Left atrium: The atrium was mildly dilated.  ASSESSMENT AND PLAN:  1.  Paroxysmal atrial fibrillation: Currently on Eliquis.  CHA2DS2-VASc of 3.  She has 1% atrial fibrillation on her Linq monitor.  She also has PACs which appear to be causing most of her symptoms.  At this point, we Vanessa [name] make no further changes as she wishes to be off of antiarrhythmics.  2. Hypertension: Currently well controlled   Current medicines are reviewed at length with the patient today.   The patient does not have concerns regarding her medicines.  The following changes were made today: None  Labs/ tests ordered today include:    Orders Placed This Encounter  Procedures  . EKG 12-Lead     Disposition:   FU with Vanessa [name] 12 months  Signed, Tymere Depuy [name], MD  04/05/2019 2:58 PM     [facility] [address] [facility] [place] [address] [phone] (office) [phone] (fax)

## 2019-04-05 NOTE — Patient Instructions (Signed)
Medication Instructions:  Your physician recommends that you continue on your current medications as directed. Please refer to the Current Medication list given to you today.  * If you need a refill on your cardiac medications before your next appointment, please call your pharmacy.   Labwork: None ordered  Testing/Procedures: None ordered  Follow-Up: At [facility], you and your health needs are our priority.  As part of our continuing mission to provide you with exceptional heart care, we have created designated Provider Care Teams.  These Care Teams include your primary Cardiologist (physician) and Advanced Practice Providers (APPs -  Physician Assistants and Nurse Practitioners) who all work together to provide you with the care you need, when you need it.  You will need a follow up appointment in 1 year.  Please call our office 2 months in advance to schedule this appointment.  You may see Dr Curt Bears or one of the following Advanced Practice Providers on your designated Care Team:    Chanetta Marshall, NP  Tommye Standard, PA-C  [place]da [place]ts, [place]   Thank you for choosing [facility]!!   Trinidad Curet, RN [phone]

## 2019-04-06 LAB — CUP PACEART INCLINIC DEVICE CHECK
Date Time Interrogation Session: 20201223143200
Implantable Pulse Generator Implant Date: 20171120

## 2019-04-10 DIAGNOSIS — L821 Other seborrheic keratosis: Secondary | ICD-10-CM | POA: Diagnosis not present

## 2019-04-10 DIAGNOSIS — D485 Neoplasm of uncertain behavior of skin: Secondary | ICD-10-CM | POA: Diagnosis not present

## 2019-04-10 DIAGNOSIS — D225 Melanocytic nevi of trunk: Secondary | ICD-10-CM | POA: Diagnosis not present

## 2019-04-16 NOTE — Progress Notes (Signed)
ILR remote 

## 2019-04-18 ENCOUNTER — Other Ambulatory Visit: Payer: Self-pay | Admitting: Cardiology

## 2019-04-21 ENCOUNTER — Ambulatory Visit (INDEPENDENT_AMBULATORY_CARE_PROVIDER_SITE_OTHER): Payer: PPO | Admitting: *Deleted

## 2019-04-21 DIAGNOSIS — I4891 Unspecified atrial fibrillation: Secondary | ICD-10-CM | POA: Diagnosis not present

## 2019-04-23 LAB — CUP PACEART REMOTE DEVICE CHECK
Date Time Interrogation Session: 20210109104651
Implantable Pulse Generator Implant Date: 20171120

## 2019-05-22 ENCOUNTER — Ambulatory Visit (INDEPENDENT_AMBULATORY_CARE_PROVIDER_SITE_OTHER): Payer: PPO | Admitting: *Deleted

## 2019-05-22 DIAGNOSIS — I4891 Unspecified atrial fibrillation: Secondary | ICD-10-CM | POA: Diagnosis not present

## 2019-05-22 LAB — CUP PACEART REMOTE DEVICE CHECK
Date Time Interrogation Session: 20210207231504
Implantable Pulse Generator Implant Date: 20171120

## 2019-05-22 NOTE — Progress Notes (Signed)
ILR Remote 

## 2019-05-31 ENCOUNTER — Other Ambulatory Visit: Payer: Self-pay | Admitting: Cardiology

## 2019-05-31 NOTE — Telephone Encounter (Signed)
Prescription refill request for Eliquis received.  Last office visit: 04/05/2019, Camn[place] Scr: 1.03, 10/13/2018 Age: 69 y.o. Weight: 86.2 kg   Prescription refill sent.

## 2019-06-05 DIAGNOSIS — H5213 Myopia, bilateral: Secondary | ICD-10-CM | POA: Diagnosis not present

## 2019-06-22 ENCOUNTER — Ambulatory Visit (INDEPENDENT_AMBULATORY_CARE_PROVIDER_SITE_OTHER): Payer: PPO | Admitting: *Deleted

## 2019-06-22 ENCOUNTER — Ambulatory Visit (INDEPENDENT_AMBULATORY_CARE_PROVIDER_SITE_OTHER): Payer: PPO | Admitting: Physician Assistant

## 2019-06-22 ENCOUNTER — Encounter: Payer: Self-pay | Admitting: Physician Assistant

## 2019-06-22 ENCOUNTER — Other Ambulatory Visit: Payer: Self-pay

## 2019-06-22 VITALS — BP 140/60 | HR 68 | Ht 68.0 in | Wt 194.8 lb

## 2019-06-22 DIAGNOSIS — R0600 Dyspnea, unspecified: Secondary | ICD-10-CM | POA: Diagnosis not present

## 2019-06-22 DIAGNOSIS — R319 Hematuria, unspecified: Secondary | ICD-10-CM

## 2019-06-22 DIAGNOSIS — I4891 Unspecified atrial fibrillation: Secondary | ICD-10-CM | POA: Diagnosis not present

## 2019-06-22 DIAGNOSIS — I480 Paroxysmal atrial fibrillation: Secondary | ICD-10-CM | POA: Diagnosis not present

## 2019-06-22 DIAGNOSIS — I10 Essential (primary) hypertension: Secondary | ICD-10-CM

## 2019-06-22 DIAGNOSIS — R0609 Other forms of dyspnea: Secondary | ICD-10-CM

## 2019-06-22 LAB — CUP PACEART REMOTE DEVICE CHECK
Date Time Interrogation Session: 20210311094003
Implantable Pulse Generator Implant Date: 20171120

## 2019-06-22 NOTE — Progress Notes (Signed)
ILR Remote 

## 2019-06-22 NOTE — Patient Instructions (Addendum)
Medication Instructions:  Your physician recommends that you continue on your current medications as directed. Please refer to the Current Medication list given to you today.  *If you need a refill on your cardiac medications before your next appointment, please call your pharmacy*   Lab Work: None ordered  If you have labs (blood work) drawn today and your tests are completely normal, you will receive your results only by: Marland Kitchen MyChart Message (if you have MyChart) OR . A paper copy in the mail If you have any lab test that is abnormal or we need to change your treatment, we will call you to review the results.   Testing/Procedures: Your physician has requested that you have an echocardiogram ARRIVE 07/07/2019 AT 7:05 A.M.  Echocardiography is a painless test that uses sound waves to create images of your heart. It provides your doctor with information about the size and shape of your heart and how well your heart's chambers and valves are working. This procedure takes approximately one hour. There are no restrictions for this procedure.   Follow-Up: At [facility], you and your health needs are our priority.  As part of our continuing mission to provide you with exceptional heart care, we have created designated Provider Care Teams.  These Care Teams include your primary Cardiologist (physician) and Advanced Practice Providers (APPs -  Physician Assistants and Nurse Practitioners) who all work together to provide you with the care you need, when you need it.  We recommend signing up for the patient portal called "MyChart".  Sign up information is provided on this After Visit Summary.  MyChart is used to connect with patients for Virtual Visits (Telemedicine).  Patients are able to view lab/test results, encounter notes, upcoming appointments, etc.  Non-urgent messages can be sent to your provider as well.   To learn more about what you can do with MyChart, go to [email or web address].     Your next appointment:   07/31/2019 arrive at 9:25   The format for your next appointment:   In Person  Provider:   You may see Sherren Mocha, MD or one of the following Advanced Practice Providers on your designated Care Team:    Truitt Merle, NP    Other Instructions   Echocardiogram An echocardiogram is a procedure that uses painless sound waves (ultrasound) to produce an image of the heart. Images from an echocardiogram can provide important information about:  Signs of coronary artery disease (CAD).  Aneurysm detection. An aneurysm is a weak or damaged part of an artery wall that bulges out from the normal force of blood pumping through the body.  Heart size and shape. Changes in the size or shape of the heart can be associated with certain conditions, including heart failure, aneurysm, and CAD.  Heart muscle function.  Heart valve function.  Signs of a past heart attack.  Fluid buildup around the heart.  Thickening of the heart muscle.  A tumor or infectious growth around the heart valves. Tell a health care provider about:  Any allergies you have.  All medicines you are taking, including vitamins, herbs, eye drops, creams, and over-the-counter medicines.  Any blood disorders you have.  Any surgeries you have had.  Any medical conditions you have.  Whether you are pregnant or may be pregnant. What are the risks? Generally, this is a safe procedure. However, problems may occur, including:  Allergic reaction to dye (contrast) that may be used during the procedure. What happens before the procedure? No  specific preparation is needed. You may eat and drink normally. What happens during the procedure?   An IV tube may be inserted into one of your veins.  You may receive contrast through this tube. A contrast is an injection that improves the quality of the pictures from your heart.  A gel will be applied to your chest.  A wand-like tool  (transducer) will be moved over your chest. The gel will help to transmit the sound waves from the transducer.  The sound waves will harmlessly bounce off of your heart to allow the heart images to be captured in real-time motion. The images will be recorded on a computer. The procedure may vary among health care providers and hospitals. What happens after the procedure?  You may return to your normal, everyday life, including diet, activities, and medicines, unless your health care provider tells you not to do that. Summary  An echocardiogram is a procedure that uses painless sound waves (ultrasound) to produce an image of the heart.  Images from an echocardiogram can provide important information about the size and shape of your heart, heart muscle function, heart valve function, and fluid buildup around your heart.  You do not need to do anything to prepare before this procedure. You may eat and drink normally.  After the echocardiogram is completed, you may return to your normal, everyday life, unless your health care provider tells you not to do that. This information is not intended to replace advice given to you by your health care provider. Make sure you discuss any questions you have with your health care provider. Document Revised: 07/21/2018 Document Reviewed: 05/02/2016 Elsevier Patient Education  [place].

## 2019-06-22 NOTE — Progress Notes (Signed)
Cardiology Office Note    Date:  06/22/2019   ID:  Vanessa [name], DOB [birth year], MRN OR:[id number]  PCP:  Myrlene Broker, MD  Cardiologist:  Dr. Burt Knack  Electrophysiologist: Dr. Curt Bears  Chief Complaint:  Follow up  History of Present Illness:   Vanessa [name] is a 69 y.o. female PAF, HTN and HLD seen for follow up.   She was initially seen in 2013 after presenting with an abnormal EKG, heart palpitations, and symptoms of exertional back, neck, and jaw discomfort. She underwent a stress Myoview scan that demonstrated no ischemia. Last seen by Dr. Burt Knack 05/2016.   Patient had LINQ placed for palpitations and found to have paroxysmal atrial fibrillation. Anticoagulated with Eliquis. CHADSVASCs score of 3. Hx of fatigue on metoprolol. Her flecainide discontinued per patients request in fall 2019.  Last seen by Dr. Curt Bears 04/05/19, per note "She has 1% atrial fibrillation on her Linq monitor.  She also has PACs which appear to be causing most of her symptoms.  At this point, we will make no further changes as she wishes to be off of antiarrhythmics".   Here today for follow up.  Patient reports progressive worsening dyspnea on exertion for past couple of months.  Now she cannot able to walk on the mailbox.  She denies associated chest tightness.  Patient feels intermittent "hard to take deep breath" seems like PVC.  She used to do exercise however not doing since pandemic has started.  She also has back issue. Noted blood in urine.   Past Medical History:  Diagnosis Date  . Anxiety   . AR (allergic rhinitis)   . Arthritis    knees  . Atrial fibrillation ([place])   . Cataract    bilateral /removed  . Depressed   . Glaucoma   . HTN (hypertension)   . Hyperlipidemia   . Overweight(278.02)     Past Surgical History:  Procedure Laterality Date  . ABDOMINAL HYSTERECTOMY    . CATARACT EXTRACTION    . catherization  11/00   normal  . COLONOSCOPY    . cyrotherapy    . EP IMPLANTABLE  DEVICE N/A 03/02/2016   Procedure: Loop Recorder Insertion;  Surgeon: Will [name], MD;  Location: [facility];  Service: Cardiovascular;  Laterality: N/A;  . EYE SURGERY     x2  . GLAUCOMA SURGERY     bilateral  . KNEE ARTHROSCOPY     left knee  . ROTATOR CUFF REPAIR     right shoulder  . WISDOM TOOTH EXTRACTION      Current Medications: Prior to Admission medications   Medication Sig Start Date End Date Taking? Authorizing Provider  Ascorbic Acid (VITAMIN C) 1000 MG tablet Take 1,000 mg by mouth daily.    [provider]  Cholecalciferol (VITAMIN D3 MAXIMUM STRENGTH) 125 MCG (5000 UT) capsule Take 5,000 Units by mouth daily.    [provider]  diltiazem (CARDIZEM CD) 180 MG 24 hr capsule TAKE 1 CAPSULE BY MOUTH ONCE DAILY 09/22/18   Camnitz, Ocie Doyne, MD  ELIQUIS 5 MG TABS tablet TAKE 1 TABLET BY MOUTH TWICE(2) DAILY 05/31/19   Camnitz, Ocie Doyne, MD  esomeprazole (NEXIUM) 40 MG capsule Take 1 capsule (40 mg total) by mouth daily. 09/21/18   Jackquline Denmark, MD  Garlic (GARLIQUE PO) Take 1 tablet by mouth daily.    [provider]  Multiple Vitamins-Minerals (OCUVITE PO) Take 1 tablet by mouth daily.    [provider]  Phendimetrazine Tartrate 105 MG CP24 TAKE ONE CAPSULE BY MOUTH DAILY 08/29/17   [provider]  rosuvastatin (CRESTOR) 10 MG tablet Take 1 tablet (10 mg total) by mouth daily. 04/18/19   Camnitz, Will Hassell Done, MD  valsartan-hydrochlorothiazide (DIOVAN-HCT) 160-12.5 MG tablet Take 1 tablet by mouth daily. 01/27/18   [provider]  Zinc 50 MG CAPS Take by mouth.    [provider]    Allergies:   Codeine   Social History   Socioeconomic History  . Marital status: Married    Spouse name: Not on file  . Number of children: Not on file  . Years of education: Not on file  . Highest education level: Not on file  Occupational History  . 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 Smoking status: Never Smoker  . Smokeless tobacco: Never Used  Substance and Sexual Activity  . Alcohol use: No  . Drug use: No  . Sexual activity: Not on file  Other Topics Concern  . Not on file  Social History Narrative   Lives in [place], [place]. Helps take care of her mother.    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   Social Determinants of Health   Financial Resource Strain:   . Difficulty of Paying Living Expenses:   Food Insecurity:   . Worried About Charity fundraiser in the Last Year:   . Arboriculturist in the Last Year:   Transportation Needs:   . Film/video editor (Medical):   Marland Kitchen Lack of Transportation (Non-Medical):   Physical Activity:   . Days of Exercise per Week:   . Minutes of Exercise per Session:   Stress:   . Feeling of Stress :   Social Connections:   . Frequency of Communication with Friends and Family:   . Frequency of Social Gatherings with Friends and Family:   . Attends Religious Services:   . Active Member of Clubs or Organizations:   . Attends Archivist Meetings:   Marland Kitchen Marital Status:      Family History:  The patient's family history includes Heart attack in her father; Stroke in her mother.   ROS:   Please see the history of present illness.    ROS All other systems reviewed and are negative.   PHYSICAL EXAM:   VS:  BP 140/60   Pulse 68   Ht 5\' 8"  (1.727 m)   Wt 194 lb 12.8 oz (88.4 kg)   SpO2 98%   BMI 29.62 kg/m    GEN: Well nourished, well developed, in no acute distress  HEENT: normal  Neck: no JVD, carotid bruits, or masses Cardiac: RRR; no murmurs, rubs, or gallops,no edema  Respiratory:  clear to auscultation bilaterally, normal work of breathing GI: soft, nontender, nondistended, + BS MS: no deformity or atrophy  Skin: warm and dry, no rash Neuro:  Alert and Oriented x 3, Strength and sensation are intact Psych: euthymic mood, full affect  Wt Readings from Last 3 Encounters:    06/22/19 194 lb 12.8 oz (88.4 kg)  04/05/19 190 lb (86.2 kg)  08/23/18 192 lb 12.8 oz (87.5 kg)      Studies/Labs Reviewed:   EKG:  EKG is not  ordered today.  Recent Labs: No results found for requested labs within last 8760 hours.   Lipid Panel    Component Value Date/Time   CHOL 113 11/29/2017 0744   TRIG 102  11/29/2017 0744   HDL 41 11/29/2017 0744   CHOLHDL 2.8 11/29/2017 0744   LDLCALC 52 11/29/2017 0744    Additional studies/ records that were reviewed today include:   Echocardiogram: 02/2016 Study Conclusions   - Left ventricle: The cavity size was normal. There was mild focal  basal hypertrophy of the septum. Systolic function was normal.  The estimated ejection fraction was in the range of 55% to 60%.  Wall motion was normal; there were no regional wall motion  abnormalities. Doppler parameters are consistent with abnormal  left ventricular relaxation (grade 1 diastolic dysfunction).  - Mitral valve: There was mild regurgitation.  - Left atrium: The atrium was mildly dilated.   ASSESSMENT & PLAN:    1. Dyspnea on exertion - Progressively worsening in past few months.  No associated chest tightness.  Her symptoms is not concerning for angina.  Her dyspnea could be due to deconditioning, stiffness of heart muscle or arrhythmia.  She had loop recorder interrogated today, pending reading.  It will be interesting to see PVC and PAF burden. Update echo.   2.  Hypertension -Blood pressure of 140/60 today.  She  is not checking her blood pressure at home. -Advised to keep log. -Will call us if systolic blood pressure persistently above 140 -No change in therapy  3.  Paroxysmal atrial fibrillation/PVCS - followed by Dr. Curt Bears >> per required antiarrhythmic/ablation based reading  -Sinus by exam.  Compliant with Eliquis.   4. Hematuraia -  She has noted blood in her urine.  Check urine and BMET   Medication Adjustments/Labs and Tests  Ordered: Current medicines are reviewed at length with the patient today.  Concerns regarding medicines are outlined above.  Medication changes, Labs and Tests ordered today are listed in the Patient Instructions below. Patient Instructions  Medication Instructions:  Your physician recommends that you continue on your current medications as directed. Please refer to the Current Medication list given to you today.  *If you need a refill on your cardiac medications before your next appointment, please call your pharmacy*   Lab Work: None ordered  If you have labs (blood work) drawn today and your tests are completely normal, you will receive your results only by: Marland Kitchen MyChart Message (if you have MyChart) OR . A paper copy in the mail If you have any lab test that is abnormal or we need to change your treatment, we will call you to review the results.   Testing/Procedures: Your physician has requested that you have an echocardiogram ARRIVE 07/07/2019 AT 7:05 A.M.  Echocardiography is a painless test that uses sound waves to create images of your heart. It provides your doctor with information about the size and shape of your heart and how well your heart's chambers and valves are working. This procedure takes approximately one hour. There are no restrictions for this procedure.   Follow-Up: At [facility], you and your health needs are our priority.  As part of our continuing mission to provide you with exceptional heart care, we have created designated Provider Care Teams.  These Care Teams include your primary Cardiologist (physician) and Advanced Practice Providers (APPs -  Physician Assistants and Nurse Practitioners) who all work together to provide you with the care you need, when you need it.  We recommend signing up for the patient portal called "MyChart".  Sign up information is provided on this After Visit Summary.  MyChart is used to connect with patients for Virtual Visits  (Telemedicine).  Patients are able to view lab/test results, encounter notes, upcoming appointments, etc.  Non-urgent messages can be sent to your provider as well.   To learn more about what you can do with MyChart, go to [email or web address].    Your next appointment:   07/31/2019 arrive at 9:25   The format for your next appointment:   In Person  Provider:   You may see Sherren Mocha, MD or one of the following Advanced Practice Providers on your designated Care Team:    Truitt Merle, NP    Other Instructions   Echocardiogram An echocardiogram is a procedure that uses painless sound waves (ultrasound) to produce an image of the heart. Images from an echocardiogram can provide important information about:  Signs of coronary artery disease (CAD).  Aneurysm detection. An aneurysm is a weak or damaged part of an artery wall that bulges out from the normal force of blood pumping through the body.  Heart size and shape. Changes in the size or shape of the heart can be associated with certain conditions, including heart failure, aneurysm, and CAD.  Heart muscle function.  Heart valve function.  Signs of a past heart attack.  Fluid buildup around the heart.  Thickening of the heart muscle.  A tumor or infectious growth around the heart valves. Tell a health care provider about:  Any allergies you have.  All medicines you are taking, including vitamins, herbs, eye drops, creams, and over-the-counter medicines.  Any blood disorders you have.  Any surgeries you have had.  Any medical conditions you have.  Whether you are pregnant or may be pregnant. What are the risks? Generally, this is a safe procedure. However, problems may occur, including:  Allergic reaction to dye (contrast) that may be used during the procedure. What happens before the procedure? No specific preparation is needed. You may eat and drink normally. What happens during the procedure?   An  IV tube may be inserted into one of your veins.  You may receive contrast through this tube. A contrast is an injection that improves the quality of the pictures from your heart.  A gel will be applied to your chest.  A wand-like tool (transducer) will be moved over your chest. The gel will help to transmit the sound waves from the transducer.  The sound waves will harmlessly bounce off of your heart to allow the heart images to be captured in real-time motion. The images will be recorded on a computer. The procedure may vary among health care providers and hospitals. What happens after the procedure?  You may return to your normal, everyday life, including diet, activities, and medicines, unless your health care provider tells you not to do that. Summary  An echocardiogram is a procedure that uses painless sound waves (ultrasound) to produce an image of the heart.  Images from an echocardiogram can provide important information about the size and shape of your heart, heart muscle function, heart valve function, and fluid buildup around your heart.  You do not need to do anything to prepare before this procedure. You may eat and drink normally.  After the echocardiogram is completed, you may return to your normal, everyday life, unless your health care provider tells you not to do that. This information is not intended to replace advice given to you by your health care provider. Make sure you discuss any questions you have with your health care provider. Document Revised: 07/21/2018 Document Reviewed: 05/02/2016 Elsevier Patient Education  [place].  Jarrett Soho, [place]  06/22/2019 4:26 PM    [facility] [place], [place], [place]  [address] Phone: [phone]; Fax: [phone]

## 2019-06-23 LAB — URINALYSIS
Bilirubin, UA: NEGATIVE
Glucose, UA: NEGATIVE
Leukocytes,UA: NEGATIVE
Nitrite, UA: NEGATIVE
Protein,UA: NEGATIVE
RBC, UA: NEGATIVE
Specific Gravity, UA: 1.024 (ref 1.005–1.030)
Urobilinogen, Ur: 0.2 mg/dL (ref 0.2–1.0)
pH, UA: 5 (ref 5.0–7.5)

## 2019-06-23 LAB — BASIC METABOLIC PANEL
BUN/Creatinine Ratio: 19 (ref 12–28)
BUN: 22 mg/dL (ref 8–27)
CO2: 24 mmol/L (ref 20–29)
Calcium: 9.3 mg/dL (ref 8.7–10.3)
Chloride: 105 mmol/L (ref 96–106)
Creatinine, Ser: 1.13 mg/dL — ABNORMAL HIGH (ref 0.57–1.00)
GFR calc Af Amer: 57 mL/min/{1.73_m2} — ABNORMAL LOW (ref >59)
GFR calc non Af Amer: 49 mL/min/{1.73_m2} — ABNORMAL LOW (ref >59)
Glucose: 94 mg/dL (ref 65–99)
Potassium: 4.2 mmol/L (ref 3.5–5.2)
Sodium: 145 mmol/L — ABNORMAL HIGH (ref 134–144)

## 2019-07-07 ENCOUNTER — Telehealth: Payer: Self-pay | Admitting: Physician Assistant

## 2019-07-07 ENCOUNTER — Other Ambulatory Visit: Payer: Self-pay

## 2019-07-07 ENCOUNTER — Ambulatory Visit (HOSPITAL_COMMUNITY): Payer: PPO | Attending: Cardiology

## 2019-07-07 DIAGNOSIS — R0600 Dyspnea, unspecified: Secondary | ICD-10-CM | POA: Diagnosis not present

## 2019-07-07 DIAGNOSIS — R319 Hematuria, unspecified: Secondary | ICD-10-CM | POA: Diagnosis not present

## 2019-07-07 DIAGNOSIS — R0609 Other forms of dyspnea: Secondary | ICD-10-CM

## 2019-07-07 MED ORDER — FUROSEMIDE 20 MG PO TABS: ORAL_TABLET | ORAL | 1 refills | Status: DC

## 2019-07-07 NOTE — Telephone Encounter (Signed)
Discussed echo results personally.  Normal pumping function of the heart with moderate stiffness of heart muscles.  Elevated LVEDP.  Aortic valve calcification without evidence of stenosis.  Patient continues to have dyspnea on exertion.  Trial of Lasix 20 mg daily for 2 days and then as needed for shortness of breath.  Blood pressure has been normalized at home.  Patient to monitor symptoms especially after taking Lasix.  Will review further in clinic.

## 2019-07-24 ENCOUNTER — Ambulatory Visit (INDEPENDENT_AMBULATORY_CARE_PROVIDER_SITE_OTHER): Payer: PPO | Admitting: *Deleted

## 2019-07-24 DIAGNOSIS — H18831 Recurrent erosion of cornea, right eye: Secondary | ICD-10-CM | POA: Diagnosis not present

## 2019-07-24 DIAGNOSIS — I4891 Unspecified atrial fibrillation: Secondary | ICD-10-CM

## 2019-07-24 LAB — CUP PACEART REMOTE DEVICE CHECK
Date Time Interrogation Session: 20210411024847
Implantable Pulse Generator Implant Date: 20171120

## 2019-07-25 NOTE — Progress Notes (Signed)
ILR Remote 

## 2019-07-25 NOTE — Progress Notes (Signed)
CARDIOLOGY OFFICE NOTE  Date:  07/31/2019    Vanessa [name] Date of Birth: 1950/10/12 Medical Record [id number]  PCP:  Myrlene Broker, MD  Cardiologist:  Hale Drone   Chief Complaint  Patient presents with  . Follow-up    History of Present Illness: Vanessa [name] is a 69 y.o. female who presents today for a one month check. Seen for Dr. Curt Bears. Has seen Dr. Burt Knack in the past.   She has a history of PAF, HTN and HLD.    Initially seen in 2013 after presenting with an abnormal EKG, heart palpitations, and symptoms of exertional back, neck, and jaw discomfort. She underwent a stress Myoview scan that demonstrated no ischemia.   She has had a LINQ placed for palpitations and found to have paroxysmal atrial fibrillation. Anticoagulated with Eliquis. CHADSVASCs score of 3. Hx of fatigue on metoprolol. Her flecainide was discontinued per patients request in fall 2019.  Last seen by Dr. Curt Bears 04/05/19, per note "She has 1% atrial fibrillation on her Linq monitor. She also has PACs which appear to be causing most of her symptoms. At this point, we will make no further changes as she wishes to be off of antiarrhythmics".   Seen here by Vin a month ago - had progressive DOE for several months - no longer able to even walk to her mailbox. Also with hematuria. Echo was updated. Her LINQ did not show increased burden of PAF or PVCs. She was treated with a short course of diuretics - for 2 days and then just prn.   The patient does not have symptoms concerning for COVID-19 infection (fever, chills, cough, or new shortness of breath).   Comes in today. Here alone. She notes that she saw no improvement in the 2 days of Lasix - she has not taken anymore. She has tried to cut her portions back - this seems to have helped. She remains pretty limited with back pain - had a terrible fall in the past - fell down 16 stairs - still struggles with this and has chronic back pain. No actual  chest pain. She has fatigue - this does not seem any worse. Her palpitations are no worse. She has lost almost 60 pounds in the past - stopped carbs/sugars - but returned to old habits. [facility]. Labs typically checked by PCP - noted in Care Everywhere.   Past Medical History:  Diagnosis Date  . Anxiety   . AR (allergic rhinitis)   . Arthritis    knees  . Atrial fibrillation ([place])   . Cataract    bilateral /removed  . Depressed   . Glaucoma   . HTN (hypertension)   . Hyperlipidemia   . Overweight(278.02)     Past Surgical History:  Procedure Laterality Date  . ABDOMINAL HYSTERECTOMY    . CATARACT EXTRACTION    . catherization  11/00   normal  . COLONOSCOPY    . cyrotherapy    . EP IMPLANTABLE DEVICE N/A 03/02/2016   Procedure: Loop Recorder Insertion;  Surgeon: Will [name], MD;  Location: [facility];  Service: Cardiovascular;  Laterality: N/A;  . EYE SURGERY     x2  . GLAUCOMA SURGERY     bilateral  . KNEE ARTHROSCOPY     left knee  . ROTATOR CUFF REPAIR     right shoulder  . WISDOM TOOTH EXTRACTION  Medications: Current Meds  Medication Sig  . Ascorbic Acid (VITAMIN C) 1000 MG tablet Take 1,000 mg by mouth daily.  . Cholecalciferol (VITAMIN D3 MAXIMUM STRENGTH) 125 MCG (5000 UT) capsule Take 5,000 Units by mouth daily.  Marland Kitchen diltiazem (CARDIZEM CD) 180 MG 24 hr capsule TAKE 1 CAPSULE BY MOUTH ONCE DAILY  . ELIQUIS 5 MG TABS tablet TAKE 1 TABLET BY MOUTH TWICE(2) DAILY  . esomeprazole (NEXIUM) 40 MG capsule Take 1 capsule (40 mg total) by mouth daily.  . Garlic (GARLIQUE PO) Take 1 tablet by mouth daily.  . Multiple Vitamins-Minerals (OCUVITE PO) Take 1 tablet by mouth daily.  . Phendimetrazine Tartrate 105 MG CP24 TAKE ONE CAPSULE BY MOUTH DAILY  . rosuvastatin (CRESTOR) 10 MG tablet Take 1 tablet (10 mg total) by mouth daily.  . valsartan-hydrochlorothiazide (DIOVAN-HCT) 160-12.5 MG tablet  Take 1 tablet by mouth daily.  . Zinc 50 MG CAPS Take 50 mg by mouth daily.   . [DISCONTINUED] furosemide (LASIX) 20 MG tablet Take 1 tablet daily by mouth for 2 days then as needed for shortness of breath.     Allergies: Allergies  Allergen Reactions  . Codeine Nausea Only    nasuea    Social History: The patient  reports that she has never smoked. She has never used smokeless tobacco. She reports that she does not drink alcohol or use drugs.   Family History: The patient's family history includes Heart attack in her father; Stroke in her mother.   Review of Systems: Please see the history of present illness.   All other systems are reviewed and negative.   Physical Exam: VS:  BP 138/70   Pulse 64   Ht 5\' 8"  (1.727 m)   Wt 194 lb 6.4 oz (88.2 kg)   SpO2 98%   BMI 29.56 kg/m  .  BMI Body mass index is 29.56 kg/m.  Wt Readings from Last 3 Encounters:  07/31/19 194 lb 6.4 oz (88.2 kg)  06/22/19 194 lb 12.8 oz (88.4 kg)  04/05/19 190 lb (86.2 kg)    General: Pleasant. Alert and in no acute distress.  She is obese.  Cardiac: Regular rate and rhythm. No murmurs, rubs, or gallops. No edema.  Respiratory:  Lungs are clear to auscultation bilaterally with normal work of breathing.  GI: Soft and nontender.  MS: No deformity or atrophy. Gait and ROM intact.  Skin: Warm and dry. Color is normal.  Neuro:  Strength and sensation are intact and no [name] focal deficits noted.  Psych: Alert, appropriate and with normal affect.   LABORATORY DATA:  EKG:  EKG is not ordered today.    Lab Results  Component Value Date   WBC 10.0 10/28/2016   HGB 12.8 10/28/2016   HCT 37.7 10/28/2016   PLT 202 10/28/2016   GLUCOSE 94 06/22/2019   CHOL 113 11/29/2017   TRIG 102 11/29/2017   HDL 41 11/29/2017   LDLCALC 52 11/29/2017   ALT 15 11/29/2017   AST 17 11/29/2017   NA 145 (H) 06/22/2019   K 4.2 06/22/2019   CL 105 06/22/2019   CREATININE 1.13 (H) 06/22/2019   BUN 22 06/22/2019     CO2 24 06/22/2019   TSH 1.643 02/19/2016   INR 1.24 10/28/2016   HGBA1C 5.3 02/19/2016     BNP (last 3 results) No results for input(s): BNP in the last 8760 hours.  ProBNP (last 3 results) No results for input(s): PROBNP in the last 8760 hours.  Other Studies Reviewed Today:  ECHO IMPRESSIONS 06/2019  1. Left ventricular ejection fraction, by estimation, is 60 to 65%. The  left ventricle has normal function. The left ventricle has no regional  wall motion abnormalities. Left ventricular diastolic parameters are  indeterminate. Elevated left ventricular  end-diastolic pressure. The average left ventricular global longitudinal  strain is -22.3 %.  2. Right ventricular systolic function is normal. The right ventricular  size is normal.  3. The mitral valve is normal in structure. Trivial mitral valve  regurgitation. No evidence of mitral stenosis.  4. The aortic valve is tricuspid. Aortic valve regurgitation is not  visualized. Moderate aortic valve sclerosis/calcification is present,  without any evidence of aortic stenosis.  5. The inferior vena cava is normal in size with greater than 50%  respiratory variability, suggesting right atrial pressure of 3 mmHg.   Comparison(s): 02/19/16 EF 55-60%.   ASSESSMENT & PLAN:    1. DOE - suspect this is multifactorial - weight/sedentary, etc. Her echo is reassuring and this was reviewed with her.   2. HTN - BP is fine on her current regimen.   3. PAF - on Eliquis - has ILR in place - followed by EP  4. Chronic anticoagulation - no problems noted - labs by PCP  5. Obesity - discussed at length - needs to try and find an exercise that she feels comfortable doing. She has had successful weight loss in the past by cutting carbs/sugars.   6. HLD - on statin therapy - labs by PCP  7. COVID-19 Education: The signs and symptoms of COVID-19 were discussed with the patient and how to seek care for testing (follow up with PCP  or arrange E-visit).  The importance of social distancing, staying at home, hand hygiene and wearing a mask when out in public were discussed today.  Current medicines are reviewed with the patient today.  The patient does not have concerns regarding medicines other than what has been noted above.  The following changes have been made:  See above.  Labs/ tests ordered today include:   No orders of the defined types were placed in this encounter.    Disposition:   FU with [place] in about 3 to 4 months. If fails to improve - would consider updating her Myoview. I suspect most of her symptoms are related to deconditioning/obesity.    Patient is agreeable to this plan and will call if any problems develop in the interim.   SignedTruitt Merle, NP  07/31/2019 9:34 AM  [facility] [address] [place], [place]  [address] Phone: [phone] Fax: [phone]

## 2019-07-27 ENCOUNTER — Ambulatory Visit: Payer: PPO | Admitting: Physician Assistant

## 2019-07-31 ENCOUNTER — Other Ambulatory Visit: Payer: Self-pay

## 2019-07-31 ENCOUNTER — Ambulatory Visit (INDEPENDENT_AMBULATORY_CARE_PROVIDER_SITE_OTHER): Payer: PPO | Admitting: Nurse Practitioner

## 2019-07-31 ENCOUNTER — Encounter: Payer: Self-pay | Admitting: Nurse Practitioner

## 2019-07-31 VITALS — BP 138/70 | HR 64 | Ht 68.0 in | Wt 194.4 lb

## 2019-07-31 DIAGNOSIS — R0609 Other forms of dyspnea: Secondary | ICD-10-CM

## 2019-07-31 DIAGNOSIS — I480 Paroxysmal atrial fibrillation: Secondary | ICD-10-CM

## 2019-07-31 DIAGNOSIS — R002 Palpitations: Secondary | ICD-10-CM | POA: Diagnosis not present

## 2019-07-31 DIAGNOSIS — Z7189 Other specified counseling: Secondary | ICD-10-CM | POA: Diagnosis not present

## 2019-07-31 DIAGNOSIS — I10 Essential (primary) hypertension: Secondary | ICD-10-CM

## 2019-07-31 DIAGNOSIS — R0600 Dyspnea, unspecified: Secondary | ICD-10-CM | POA: Diagnosis not present

## 2019-07-31 DIAGNOSIS — H18521 Epithelial (juvenile) corneal dystrophy, right eye: Secondary | ICD-10-CM | POA: Diagnosis not present

## 2019-07-31 NOTE — Patient Instructions (Addendum)
After Visit Summary:  We will be checking the following labs today - NONE   Medication Instructions:    Continue with your current medicines.    If you need a refill on your cardiac medications before your next appointment, please call your pharmacy.     Testing/Procedures To Be Arranged:  N/A  Follow-Up:   See me in about 3 to 4 months.     At [facility], you and your health needs are our priority.  As part of our continuing mission to provide you with exceptional heart care, we have created designated Provider Care Teams.  These Care Teams include your primary Cardiologist (physician) and Advanced Practice Providers (APPs -  Physician Assistants and Nurse Practitioners) who all work together to provide you with the care you need, when you need it.  Special Instructions:  . Stay safe, stay home, wash your hands for at least 20 seconds and wear a mask when out in public.  . It was good to talk with you today.    Call the [place] office at [phone] if you have any questions, problems or concerns.

## 2019-08-24 LAB — CUP PACEART REMOTE DEVICE CHECK
Date Time Interrogation Session: 20210512025317
Implantable Pulse Generator Implant Date: 20171120

## 2019-08-28 ENCOUNTER — Ambulatory Visit (INDEPENDENT_AMBULATORY_CARE_PROVIDER_SITE_OTHER): Payer: PPO | Admitting: *Deleted

## 2019-08-28 DIAGNOSIS — R002 Palpitations: Secondary | ICD-10-CM | POA: Diagnosis not present

## 2019-08-29 NOTE — Progress Notes (Signed)
Carelink Summary Report / Loop Recorder 

## 2019-09-07 ENCOUNTER — Telehealth: Payer: Self-pay

## 2019-09-07 NOTE — Telephone Encounter (Signed)
Spoke to patient, no new symptoms, LINQ reporting appears to be same as previous. Will follow.

## 2019-10-02 ENCOUNTER — Other Ambulatory Visit: Payer: Self-pay | Admitting: Cardiology

## 2019-10-02 ENCOUNTER — Ambulatory Visit (INDEPENDENT_AMBULATORY_CARE_PROVIDER_SITE_OTHER): Payer: PPO | Admitting: *Deleted

## 2019-10-02 DIAGNOSIS — R002 Palpitations: Secondary | ICD-10-CM | POA: Diagnosis not present

## 2019-10-02 LAB — CUP PACEART REMOTE DEVICE CHECK
Date Time Interrogation Session: 20210621003533
Implantable Pulse Generator Implant Date: 20171120

## 2019-10-03 ENCOUNTER — Other Ambulatory Visit: Payer: Self-pay

## 2019-10-03 NOTE — Progress Notes (Signed)
Carelink Summary Report / Loop Recorder 

## 2019-10-04 MED ORDER — DILTIAZEM HCL ER COATED BEADS 180 MG PO CP24: 180.0000 mg | ORAL_CAPSULE | Freq: Every day | ORAL | 5 refills | Status: DC

## 2019-11-06 ENCOUNTER — Ambulatory Visit: Payer: PPO

## 2019-11-07 ENCOUNTER — Telehealth: Payer: Self-pay

## 2019-11-07 LAB — CUP PACEART REMOTE DEVICE CHECK
Date Time Interrogation Session: 20210725231553
Implantable Pulse Generator Implant Date: 20171120

## 2019-11-07 NOTE — Progress Notes (Signed)
CARDIOLOGY OFFICE NOTE  Date:  11/20/2019    Vanessa [name] Date of Birth: [birth year] Medical Record #[id number]  PCP:  Myrlene Broker, MD  Cardiologist:  Servando Snare & Camnitz/Cooper  Chief Complaint  Patient presents with  . Follow-up    History of Present Illness: Vanessa [name] is a 69 y.o. female who presents today for a 4 month check. Seen for Dr. Curt Bears. Has seen Dr. Burt Knack in the past.   She has a history of PAF, HTN and HLD.   Initially seen in 2013 after presenting with an abnormal EKG, heart palpitations, and symptoms of exertional back, neck, and jaw discomfort. She underwent a stress Myoview scan that demonstrated no ischemia.  She has had a LINQ placed for palpitations and found to have paroxysmal atrial fibrillation. Anticoagulated with Eliquis. CHADSVASCs score of 3. Hx of fatigue on metoprolol. Her flecainide was discontinued per patients request in fall 2019.  Last seen by Dr. Curt Bears 04/05/19, per note "She has 1% atrial fibrillation on her Linq monitor. She also has PACs which appear to be causing most of her symptoms. At this point, we will make no further changes as she wishes to be off of antiarrhythmics".   Seen here by Vin back in March- had progressive DOE for several months - no longer able to even walk to her mailbox. Also with hematuria. Echo was updated. Her LINQ did not show increased burden of PAF or PVCs. She was treated with a short course of diuretics - for 2 days and then just prn. I then saw her in follow up - she had had no improvement with diuretic therapy - seemed to have benefited more from cutting portions back and focusing on weight loss.   Comes in today. Here alone. She has been busy cleaning out her mother's house now that her brother has moved out of that home - this has kept her busy. Says she "is ok". She has had to be back on some Prednisone for a prior injury - sounds like for inflammation. Her breathing is stable. She has  not had any chest pain. She is not dizzy or lightheaded. Tolerating her medicines and needing refills.   Past Medical History:  Diagnosis Date  . Anxiety   . AR (allergic rhinitis)   . Arthritis    knees  . Atrial fibrillation ([place])   . Cataract    bilateral /removed  . Depressed   . Glaucoma   . HTN (hypertension)   . Hyperlipidemia   . Overweight(278.02)     Past Surgical History:  Procedure Laterality Date  . ABDOMINAL HYSTERECTOMY    . CATARACT EXTRACTION    . catherization  11/00   normal  . COLONOSCOPY    . cyrotherapy    . EP IMPLANTABLE DEVICE N/A 03/02/2016   Procedure: Loop Recorder Insertion;  Surgeon: Will [name], MD;  Location: [facility];  Service: Cardiovascular;  Laterality: N/A;  . EYE SURGERY     x2  . GLAUCOMA SURGERY     bilateral  . KNEE ARTHROSCOPY     left knee  . ROTATOR CUFF REPAIR     right shoulder  . WISDOM TOOTH EXTRACTION       Medications: Current Meds  Medication Sig  . apixaban (ELIQUIS) 5 MG TABS tablet TAKE 1 TABLET BY MOUTH TWICE(2) DAILY  . Ascorbic Acid (VITAMIN C) 1000 MG tablet Take 1,000 mg by mouth daily.  . Cholecalciferol (VITAMIN D3 MAXIMUM STRENGTH)  125 MCG (5000 UT) capsule Take 5,000 Units by mouth daily.  Marland Kitchen diltiazem (CARDIZEM CD) 180 MG 24 hr capsule Take 1 capsule (180 mg total) by mouth daily.  Marland Kitchen esomeprazole (NEXIUM) 40 MG capsule Take 1 capsule (40 mg total) by mouth daily.  . Garlic (GARLIQUE PO) Take 1 tablet by mouth daily.  . Multiple Vitamins-Minerals (OCUVITE PO) Take 1 tablet by mouth daily.  . Phendimetrazine Tartrate 105 MG CP24 TAKE ONE CAPSULE BY MOUTH DAILY  . rosuvastatin (CRESTOR) 10 MG tablet Take 1 tablet (10 mg total) by mouth daily.  . valsartan-hydrochlorothiazide (DIOVAN-HCT) 160-12.5 MG tablet Take 1 tablet by mouth daily.  . Zinc 50 MG CAPS Take 50 mg by mouth daily.   . [DISCONTINUED] diltiazem (CARDIZEM CD) 180 MG 24 hr capsule Take 1 capsule (180 mg total) by mouth  daily.  . [DISCONTINUED] ELIQUIS 5 MG TABS tablet TAKE 1 TABLET BY MOUTH TWICE(2) DAILY  . [DISCONTINUED] rosuvastatin (CRESTOR) 10 MG tablet Take 1 tablet (10 mg total) by mouth daily.  . [DISCONTINUED] valsartan-hydrochlorothiazide (DIOVAN-HCT) 160-12.5 MG tablet Take 1 tablet by mouth daily.     Allergies: Allergies  Allergen Reactions  . Codeine Nausea Only    nasuea    Social History: The patient  reports that she has never smoked. She has never used smokeless tobacco. She reports that she does not drink alcohol and does not use drugs.   Family History: The patient's family history includes Heart attack in her father; Stroke in her mother.   Review of Systems: Please see the history of present illness.   All other systems are reviewed and negative.   Physical Exam: VS:  BP 136/74   Pulse (!) 57   Ht 5\' 8"  (1.727 m)   Wt 192 lb (87.1 kg)   SpO2 98%   BMI 29.19 kg/m  .  BMI Body mass index is 29.19 kg/m.  Wt Readings from Last 3 Encounters:  11/20/19 192 lb (87.1 kg)  07/31/19 194 lb 6.4 oz (88.2 kg)  06/22/19 194 lb 12.8 oz (88.4 kg)    General: Pleasant. Alert and in no acute distress.   Cardiac: Regular rate and rhythm. No murmurs, rubs, or gallops. No edema.  Respiratory:  Lungs are clear to auscultation bilaterally with normal work of breathing.  GI: Soft and nontender.  MS: No deformity or atrophy. Gait and ROM intact.  Skin: Warm and dry. Color is normal.  Neuro:  Strength and sensation are intact and no [name] focal deficits noted.  Psych: Alert, appropriate and with normal affect.   LABORATORY DATA:  EKG:  EKG is not ordered today.    Lab Results  Component Value Date   WBC 10.0 10/28/2016   HGB 12.8 10/28/2016   HCT 37.7 10/28/2016   PLT 202 10/28/2016   GLUCOSE 94 06/22/2019   CHOL 113 11/29/2017   TRIG 102 11/29/2017   HDL 41 11/29/2017   LDLCALC 52 11/29/2017   ALT 15 11/29/2017   AST 17 11/29/2017   NA 145 (H) 06/22/2019   K 4.2  06/22/2019   CL 105 06/22/2019   CREATININE 1.13 (H) 06/22/2019   BUN 22 06/22/2019   CO2 24 06/22/2019   TSH 1.643 02/19/2016   INR 1.24 10/28/2016   HGBA1C 5.3 02/19/2016     BNP (last 3 results) No results for input(s): BNP in the last 8760 hours.  ProBNP (last 3 results) No results for input(s): PROBNP in the last 8760 hours.   Other  Studies Reviewed Today:  ECHO IMPRESSIONS 06/2019  1. Left ventricular ejection fraction, by estimation, is 60 to 65%. The  left ventricle has normal function. The left ventricle has no regional  wall motion abnormalities. Left ventricular diastolic parameters are  indeterminate. Elevated left ventricular  end-diastolic pressure. The average left ventricular global longitudinal  strain is -22.3 %.  2. Right ventricular systolic function is normal. The right ventricular  size is normal.  3. The mitral valve is normal in structure. Trivial mitral valve  regurgitation. No evidence of mitral stenosis.  4. The aortic valve is tricuspid. Aortic valve regurgitation is not  visualized. Moderate aortic valve sclerosis/calcification is present,  without any evidence of aortic stenosis.  5. The inferior vena cava is normal in size with greater than 50%  respiratory variability, suggesting right atrial pressure of 3 mmHg.   Comparison(s): 02/19/16 EF 55-60%.   ASSESSMENT & PLAN:    1. DOE - seems stable and suspected to be multifactorial with her weight/sedentary, etc. Echo was stable and reassuring.   2. HTN - BP looks good on her current regimen - I have refilled her medicines and we will continue the DiovanHCT, and Diltiazem  3. HLD - on statin - needs labs today  4. PAF - in sinus by exam. Her loop is to be explanted next month.   5. Chronic anticoagulation with Eliquis - check surveillance labs today.   6. Obesity - she is cutting back on portions - down 2 pounds - encouragement given.   Current medicines are reviewed with the  patient today.  The patient does not have concerns regarding medicines other than what has been noted above.  The following changes have been made:  See above.  Labs/ tests ordered today include:    Orders Placed This Encounter  Procedures  . Basic metabolic panel  . CBC  . Hepatic function panel  . Lipid panel     Disposition:   FU with [place] in 6 months.   Patient is agreeable to this plan and will call if any problems develop in the interim.   SignedTruitt Merle, NP  11/20/2019 12:22 PM  [facility] [address] [place], [place]  [address] Phone: [phone] Fax: [phone]

## 2019-11-07 NOTE — Telephone Encounter (Signed)
Carelink transmission received- device reached ERI on 10/20/19.  Spoke with pt advised can discontinue sending manual transmissions.  Carelink and Paceart updated, patient inactivated, return kit requested.  Future remote checks cancelled.  Pt would like OV with Dr. Curt Bears to remove loop recorder.  Pt requests call on home # to arrange appt.

## 2019-11-13 DIAGNOSIS — L814 Other melanin hyperpigmentation: Secondary | ICD-10-CM | POA: Diagnosis not present

## 2019-11-13 DIAGNOSIS — D485 Neoplasm of uncertain behavior of skin: Secondary | ICD-10-CM | POA: Diagnosis not present

## 2019-11-13 DIAGNOSIS — L578 Other skin changes due to chronic exposure to nonionizing radiation: Secondary | ICD-10-CM | POA: Diagnosis not present

## 2019-11-13 DIAGNOSIS — D225 Melanocytic nevi of trunk: Secondary | ICD-10-CM | POA: Diagnosis not present

## 2019-11-13 DIAGNOSIS — D1801 Hemangioma of skin and subcutaneous tissue: Secondary | ICD-10-CM | POA: Diagnosis not present

## 2019-11-13 DIAGNOSIS — L820 Inflamed seborrheic keratosis: Secondary | ICD-10-CM | POA: Diagnosis not present

## 2019-11-20 ENCOUNTER — Other Ambulatory Visit: Payer: Self-pay

## 2019-11-20 ENCOUNTER — Encounter: Payer: Self-pay | Admitting: Nurse Practitioner

## 2019-11-20 ENCOUNTER — Ambulatory Visit (INDEPENDENT_AMBULATORY_CARE_PROVIDER_SITE_OTHER): Payer: PPO | Admitting: Nurse Practitioner

## 2019-11-20 VITALS — BP 136/74 | HR 57 | Ht 68.0 in | Wt 192.0 lb

## 2019-11-20 DIAGNOSIS — Z79899 Other long term (current) drug therapy: Secondary | ICD-10-CM | POA: Diagnosis not present

## 2019-11-20 DIAGNOSIS — R002 Palpitations: Secondary | ICD-10-CM | POA: Diagnosis not present

## 2019-11-20 DIAGNOSIS — I10 Essential (primary) hypertension: Secondary | ICD-10-CM | POA: Diagnosis not present

## 2019-11-20 DIAGNOSIS — R0600 Dyspnea, unspecified: Secondary | ICD-10-CM

## 2019-11-20 DIAGNOSIS — I480 Paroxysmal atrial fibrillation: Secondary | ICD-10-CM

## 2019-11-20 DIAGNOSIS — R0609 Other forms of dyspnea: Secondary | ICD-10-CM

## 2019-11-20 LAB — BASIC METABOLIC PANEL
BUN/Creatinine Ratio: 24 (ref 12–28)
BUN: 35 mg/dL — ABNORMAL HIGH (ref 8–27)
CO2: 25 mmol/L (ref 20–29)
Calcium: 9.4 mg/dL (ref 8.7–10.3)
Chloride: 99 mmol/L (ref 96–106)
Creatinine, Ser: 1.43 mg/dL — ABNORMAL HIGH (ref 0.57–1.00)
GFR calc Af Amer: 43 mL/min/{1.73_m2} — ABNORMAL LOW (ref >59)
GFR calc non Af Amer: 37 mL/min/{1.73_m2} — ABNORMAL LOW (ref >59)
Glucose: 117 mg/dL — ABNORMAL HIGH (ref 65–99)
Potassium: 4.1 mmol/L (ref 3.5–5.2)
Sodium: 135 mmol/L (ref 134–144)

## 2019-11-20 LAB — CBC
Hematocrit: 39.2 % (ref 34.0–46.6)
Hemoglobin: 13.1 g/dL (ref 11.1–15.9)
MCH: 30.2 pg (ref 26.6–33.0)
MCHC: 33.4 g/dL (ref 31.5–35.7)
MCV: 90 fL (ref 79–97)
Platelets: 201 10*3/uL (ref 150–450)
RBC: 4.34 x10E6/uL (ref 3.77–5.28)
RDW: 13.3 % (ref 11.7–15.4)
WBC: 14.8 10*3/uL — ABNORMAL HIGH (ref 3.4–10.8)

## 2019-11-20 LAB — HEPATIC FUNCTION PANEL
ALT: 23 IU/L (ref 0–32)
AST: 20 IU/L (ref 0–40)
Albumin: 4.2 g/dL (ref 3.8–4.8)
Alkaline Phosphatase: 53 IU/L (ref 48–121)
Bilirubin Total: 0.5 mg/dL (ref 0.0–1.2)
Bilirubin, Direct: 0.19 mg/dL (ref 0.00–0.40)
Total Protein: 6.3 g/dL (ref 6.0–8.5)

## 2019-11-20 LAB — LIPID PANEL
Chol/HDL Ratio: 2.2 ratio (ref 0.0–4.4)
Cholesterol, Total: 134 mg/dL (ref 100–199)
HDL: 61 mg/dL (ref >39)
LDL Chol Calc (NIH): 54 mg/dL (ref 0–99)
Triglycerides: 101 mg/dL (ref 0–149)
VLDL Cholesterol Cal: 19 mg/dL (ref 5–40)

## 2019-11-20 MED ORDER — VALSARTAN-HYDROCHLOROTHIAZIDE 160-12.5 MG PO TABS: 1.0000 | ORAL_TABLET | Freq: Every day | ORAL | 3 refills | Status: DC

## 2019-11-20 MED ORDER — APIXABAN 5 MG PO TABS: ORAL_TABLET | ORAL | 3 refills | Status: DC

## 2019-11-20 MED ORDER — ROSUVASTATIN CALCIUM 10 MG PO TABS: 10.0000 mg | ORAL_TABLET | Freq: Every day | ORAL | 3 refills | Status: DC

## 2019-11-20 MED ORDER — DILTIAZEM HCL ER COATED BEADS 180 MG PO CP24: 180.0000 mg | ORAL_CAPSULE | Freq: Every day | ORAL | 3 refills | Status: DC

## 2019-11-20 NOTE — Patient Instructions (Addendum)
After Visit Summary:  We will be checking the following labs today - BMET, CBC, HPF and Lipids   Medication Instructions:    Continue with your current medicines.   I refilled your medicines today.    If you need a refill on your cardiac medications before your next appointment, please call your pharmacy.     Testing/Procedures To Be Arranged:  N/A  Follow-Up:   See [place] back in 6 months     At [facility], you and your health needs are our priority.  As part of our continuing mission to provide you with exceptional heart care, we have created designated Provider Care Teams.  These Care Teams include your primary Cardiologist (physician) and Advanced Practice Providers (APPs -  Physician Assistants and Nurse Practitioners) who all work together to provide you with the care you need, when you need it.  Special Instructions:  . Stay safe, wash your hands for at least 20 seconds and wear a mask when needed.  . It was good to talk with you today.    Call the [place] office at [phone] if you have any questions, problems or concerns.

## 2019-11-21 ENCOUNTER — Other Ambulatory Visit: Payer: Self-pay | Admitting: *Deleted

## 2019-11-21 DIAGNOSIS — D72829 Elevated white blood cell count, unspecified: Secondary | ICD-10-CM

## 2019-11-21 DIAGNOSIS — Z79899 Other long term (current) drug therapy: Secondary | ICD-10-CM

## 2019-11-21 DIAGNOSIS — I480 Paroxysmal atrial fibrillation: Secondary | ICD-10-CM

## 2019-12-14 ENCOUNTER — Other Ambulatory Visit: Payer: PPO

## 2019-12-14 ENCOUNTER — Ambulatory Visit (INDEPENDENT_AMBULATORY_CARE_PROVIDER_SITE_OTHER): Payer: PPO | Admitting: Cardiology

## 2019-12-14 ENCOUNTER — Other Ambulatory Visit: Payer: Self-pay

## 2019-12-14 ENCOUNTER — Encounter: Payer: Self-pay | Admitting: Cardiology

## 2019-12-14 VITALS — BP 132/64 | HR 96 | Ht 68.0 in | Wt 191.2 lb

## 2019-12-14 DIAGNOSIS — Z79899 Other long term (current) drug therapy: Secondary | ICD-10-CM

## 2019-12-14 DIAGNOSIS — I480 Paroxysmal atrial fibrillation: Secondary | ICD-10-CM

## 2019-12-14 DIAGNOSIS — D72829 Elevated white blood cell count, unspecified: Secondary | ICD-10-CM

## 2019-12-14 NOTE — Progress Notes (Signed)
Electrophysiology Office Note   Date:  12/14/2019   ID:  Vanessa [name], DOB May 29, 1950, MRN [id number]  PCP:  Myrlene Broker, MD  Cardiologist:  Burt Knack Primary Electrophysiologist:  Heron Pitcock [name], MD    No chief complaint on file.    History of Present Illness: Vanessa [name] is a 69 y.o. female who presents today for electrophysiology evaluation.   History of exertional pain w/ neg MV 2013, FH CAD, HTN, HLD. Had a LINQ implanted for palpitations found to be in atrial fibrillation. She says that she has had a few further episodes of palpitations since last being seen. She has a 3% burden on her Linq interrogation.  Today, denies symptoms of palpitations, chest pain, shortness of breath, orthopnea, PND, lower extremity edema, claudication, dizziness, presyncope, syncope, bleeding, or neurologic sequela. The patient is tolerating medications without difficulties.  She has been doing well.  Her Linq monitor battery has run down, she wishes it explanted today.   Past Medical History:  Diagnosis Date  . Anxiety   . AR (allergic rhinitis)   . Arthritis    knees  . Atrial fibrillation ([place])   . Cataract    bilateral /removed  . Depressed   . Glaucoma   . HTN (hypertension)   . Hyperlipidemia   . Overweight(278.02)    Past Surgical History:  Procedure Laterality Date  . ABDOMINAL HYSTERECTOMY    . CATARACT EXTRACTION    . catherization  11/00   normal  . COLONOSCOPY    . cyrotherapy    . EP IMPLANTABLE DEVICE N/A 03/02/2016   Procedure: Loop Recorder Insertion;  Surgeon: Vanessa House [name], MD;  Location: [facility];  Service: Cardiovascular;  Laterality: N/A;  . EYE SURGERY     x2  . GLAUCOMA SURGERY     bilateral  . KNEE ARTHROSCOPY     left knee  . ROTATOR CUFF REPAIR     right shoulder  . WISDOM TOOTH EXTRACTION       Current Outpatient Medications  Medication Sig Dispense Refill  . apixaban (ELIQUIS) 5 MG TABS tablet TAKE 1 TABLET BY MOUTH  TWICE(2) DAILY 180 tablet 3  . Ascorbic Acid (VITAMIN C) 1000 MG tablet Take 1,000 mg by mouth daily.    . Cholecalciferol (VITAMIN D3 MAXIMUM STRENGTH) 125 MCG (5000 UT) capsule Take 5,000 Units by mouth daily.    Marland Kitchen diltiazem (CARDIZEM CD) 180 MG 24 hr capsule Take 1 capsule (180 mg total) by mouth daily. 90 capsule 3  . esomeprazole (NEXIUM) 40 MG capsule Take 1 capsule (40 mg total) by mouth daily. 30 capsule 6  . Garlic (GARLIQUE PO) Take 1 tablet by mouth daily.    . Multiple Vitamins-Minerals (OCUVITE PO) Take 1 tablet by mouth daily.    . Phendimetrazine Tartrate 105 MG CP24 TAKE ONE CAPSULE BY MOUTH DAILY    . predniSONE (DELTASONE) 10 MG tablet prednisone 10 mg tablet  take 1 tab three times a day for 2 days, 1 tab twice a day for 5 days, 1 tab daily till finished    . rosuvastatin (CRESTOR) 10 MG tablet Take 1 tablet (10 mg total) by mouth daily. 90 tablet 3  . valsartan-hydrochlorothiazide (DIOVAN-HCT) 160-12.5 MG tablet Take 1 tablet by mouth daily. 90 tablet 3  . Zinc 50 MG CAPS Take 50 mg by mouth daily.      No current facility-administered medications for this visit.    Allergies:   Codeine   Social History:  The patient  reports that she has never smoked. She has never used smokeless tobacco. She reports that she does not drink alcohol and does not use drugs.   Family History:  The patient's family history includes Heart attack in her father; Stroke in her mother.   ROS:  Please see the history of present illness.   Otherwise, review of systems is positive for none.   All other systems are reviewed and negative.   PHYSICAL EXAM: VS:  BP 132/64   Pulse 96   Ht 5\' 8"  (1.727 m)   Wt 191 lb 3.2 oz (86.7 kg)   SpO2 96%   BMI 29.07 kg/m  , BMI Body mass index is 29.07 kg/m. GEN: Well nourished, well developed, in no acute distress  HEENT: normal  Neck: no JVD, carotid bruits, or masses Cardiac: RRR; no murmurs, rubs, or gallops,no edema  Respiratory:  clear to  auscultation bilaterally, normal work of breathing GI: soft, nontender, nondistended, + BS MS: no deformity or atrophy  Skin: warm and dry, device site well healed Neuro:  Strength and sensation are intact Psych: euthymic mood, full affect  EKG:  EKG is not ordered today. Personal review of the ekg ordered 04/05/19 shows Sinus rhythm, rate 61  Personal review of the device interrogation today. Results in Vanessa [name]: 11/20/2019: ALT 23; BUN 35; Creatinine, Ser 1.43; Hemoglobin 13.1; Platelets 201; Potassium 4.1; Sodium 135    Lipid Panel     Component Value Date/Time   CHOL 134 11/20/2019 1221   TRIG 101 11/20/2019 1221   HDL 61 11/20/2019 1221   CHOLHDL 2.2 11/20/2019 1221   LDLCALC 54 11/20/2019 1221     Wt Readings from Last 3 Encounters:  12/14/19 191 lb 3.2 oz (86.7 kg)  11/20/19 192 lb (87.1 kg)  07/31/19 194 lb 6.4 oz (88.2 kg)      Other studies Reviewed: Additional studies/ records that were reviewed today include: TTE 02/19/16  Review of the above records today demonstrates:  - Left ventricle: The cavity size was normal. There was mild focal   basal hypertrophy of the septum. Systolic function was normal.   The estimated ejection fraction was in the range of 55% to 60%.   Wall motion was normal; there were no regional wall motion   abnormalities. Doppler parameters are consistent with abnormal   left ventricular relaxation (grade 1 diastolic dysfunction). - Mitral valve: There was mild regurgitation. - Left atrium: The atrium was mildly dilated.  ASSESSMENT AND PLAN:  1.  Paroxysmal atrial fibrillation: Currently on Eliquis with a CHA2DS2-VASc of 3.  Is at 1% on a Linq monitor.  At this point, her Linq monitor battery has run down and she.  Risks and benefits were discussed include bleeding and infection.  She understands these risks and is agreed to the procedure.   2.  Hypertension:well controlled   Current medicines are reviewed at length with  the patient today.   The patient does not have concerns regarding her medicines.  The following changes were made today: none  Labs/ tests ordered today include:  No orders of the defined types were placed in this encounter.    Disposition:   FU with Vanessa [name] 6 months  Signed, Vanessa Bultman [name], MD  12/14/2019 3:52 PM     [place] [address] [phone] (office) [phone] (fax)  SURGEON:  Vanessa Lai, MD     PREPROCEDURE DIAGNOSIS: Atrial fibrillation  POSTPROCEDURE DIAGNOSIS: Atrial fibrillation     PROCEDURES:   1. Implantable loop recorder implantation    INTRODUCTION:  Vanessa [name] is a 69 y.o. female with a history of unexplained stroke who presents today for implantable loop explant.  she has had a LINQ monitor.  LINQ is now at [facility] and wishes the monitor explanted.     DESCRIPTION OF PROCEDURE:  Informed written consent was obtained, and the patient was brought to the electrophysiology lab in a fasting state.  The patient required no sedation for the procedure today.  The patients left chest was therefore prepped and draped in the usual sterile fashion by the EP lab staff. The skin overlying the left parasternal region was infiltrated with lidocaine for local analgesia.  A 0.5-cm incision was made over the left parasternal region over the existing LINQ monitor.  Using a combination of sharp and blount dissection, the LINQ monitor was removed from the pocket.  Steri- Strips and a sterile dressing were then applied.  There were no early apparent complications.     CONCLUSIONS:   1. Successful explant of a [employer]onic Reveal LINQ implantable loop recorder for Atrial fibrillation  2. No early apparent complications.

## 2019-12-14 NOTE — Patient Instructions (Signed)
Medication Instructions:  Your physician recommends that you continue on your current medications as directed. Please refer to the Current Medication list given to you today.  *If you need a refill on your cardiac medications before your next appointment, please call your pharmacy*   Lab Work: None ordered   Testing/Procedures: None ordered   Follow-Up: At [facility], you and your health needs are our priority.  As part of our continuing mission to provide you with exceptional heart care, we have created designated Provider Care Teams.  These Care Teams include your primary Cardiologist (physician) and Advanced Practice Providers (APPs -  Physician Assistants and Nurse Practitioners) who all work together to provide you with the care you need, when you need it.  We recommend signing up for the patient portal called "MyChart".  Sign up information is provided on this After Visit Summary.  MyChart is used to connect with patients for Virtual Visits (Telemedicine).  Patients are able to view lab/test results, encounter notes, upcoming appointments, etc.  Non-urgent messages can be sent to your provider as well.   To learn more about what you can do with MyChart, go to [email or web address].    Your next appointment:   6 month(s)  The format for your next appointment:   In Person  Provider:   Will Camnitz, MD   Thank you for choosing [facility]!!   Natacha Jepsen, RN [phone]    Other Instructions    

## 2019-12-15 LAB — CBC WITH DIFFERENTIAL/PLATELET
Basophils Absolute: 0.1 10*3/uL (ref 0.0–0.2)
Basos: 1 %
EOS (ABSOLUTE): 0.3 10*3/uL (ref 0.0–0.4)
Eos: 3 %
Hematocrit: 35.8 % (ref 34.0–46.6)
Hemoglobin: 11.9 g/dL (ref 11.1–15.9)
Immature Grans (Abs): 0.1 10*3/uL (ref 0.0–0.1)
Immature Granulocytes: 1 %
Lymphocytes Absolute: 1.5 10*3/uL (ref 0.7–3.1)
Lymphs: 16 %
MCH: 29.8 pg (ref 26.6–33.0)
MCHC: 33.2 g/dL (ref 31.5–35.7)
MCV: 90 fL (ref 79–97)
Monocytes Absolute: 0.5 10*3/uL (ref 0.1–0.9)
Monocytes: 5 %
Neutrophils Absolute: 6.7 10*3/uL (ref 1.4–7.0)
Neutrophils: 74 %
Platelets: 294 10*3/uL (ref 150–450)
RBC: 4 x10E6/uL (ref 3.77–5.28)
RDW: 13.7 % (ref 11.7–15.4)
WBC: 9.2 10*3/uL (ref 3.4–10.8)

## 2019-12-15 LAB — BASIC METABOLIC PANEL
BUN/Creatinine Ratio: 13 (ref 12–28)
BUN: 18 mg/dL (ref 8–27)
CO2: 25 mmol/L (ref 20–29)
Calcium: 9.4 mg/dL (ref 8.7–10.3)
Chloride: 104 mmol/L (ref 96–106)
Creatinine, Ser: 1.37 mg/dL — ABNORMAL HIGH (ref 0.57–1.00)
GFR calc Af Amer: 45 mL/min/{1.73_m2} — ABNORMAL LOW (ref >59)
GFR calc non Af Amer: 39 mL/min/{1.73_m2} — ABNORMAL LOW (ref >59)
Glucose: 102 mg/dL — ABNORMAL HIGH (ref 65–99)
Potassium: 4.1 mmol/L (ref 3.5–5.2)
Sodium: 143 mmol/L (ref 134–144)

## 2019-12-26 ENCOUNTER — Other Ambulatory Visit: Payer: Self-pay

## 2019-12-26 ENCOUNTER — Ambulatory Visit (INDEPENDENT_AMBULATORY_CARE_PROVIDER_SITE_OTHER): Payer: PPO | Admitting: Emergency Medicine

## 2019-12-26 DIAGNOSIS — I480 Paroxysmal atrial fibrillation: Secondary | ICD-10-CM

## 2019-12-26 NOTE — Progress Notes (Signed)
Wound check in clinic, s/p ILR explant on 12/14/19.  Steri-strips removed.  Incision edges fully approximated and well healed.  No evidence of infection.  Carelink monitor previously returned.  Patient aware of next appointment with Dr. Curt Bears on 06/13/20.

## 2020-01-17 DIAGNOSIS — I10 Essential (primary) hypertension: Secondary | ICD-10-CM | POA: Diagnosis not present

## 2020-01-17 DIAGNOSIS — I480 Paroxysmal atrial fibrillation: Secondary | ICD-10-CM | POA: Diagnosis not present

## 2020-01-17 DIAGNOSIS — H6123 Impacted cerumen, bilateral: Secondary | ICD-10-CM | POA: Diagnosis not present

## 2020-01-17 DIAGNOSIS — Z0000 Encounter for general adult medical examination without abnormal findings: Secondary | ICD-10-CM | POA: Diagnosis not present

## 2020-01-17 DIAGNOSIS — K219 Gastro-esophageal reflux disease without esophagitis: Secondary | ICD-10-CM | POA: Diagnosis not present

## 2020-01-17 DIAGNOSIS — Z1211 Encounter for screening for malignant neoplasm of colon: Secondary | ICD-10-CM | POA: Diagnosis not present

## 2020-01-17 DIAGNOSIS — E782 Mixed hyperlipidemia: Secondary | ICD-10-CM | POA: Diagnosis not present

## 2020-02-23 DIAGNOSIS — Z1231 Encounter for screening mammogram for malignant neoplasm of breast: Secondary | ICD-10-CM | POA: Diagnosis not present

## 2020-05-06 NOTE — Progress Notes (Signed)
CARDIOLOGY OFFICE NOTE  Date:  05/20/2020    Vanessa [name] Date of Birth: [birth year] Medical Record [id number]  PCP:  Myrlene Broker, MD  Cardiologist:  Servando Snare & Camnitz/Cooper   Chief Complaint  Patient presents with  . Follow-up    Seen for Dr. Burt Knack and Curt Bears    History of Present Illness: Vanessa [name] is a 70 y.o. female who presents today for a follow up visit. Seen for Dr. Curt Bears. Has seen Dr. Burt Knack in the past.    She has a history of PAF, HTN and HLD.     Initially seen in 2013 after presenting with an abnormal EKG, heart palpitations, and symptoms of exertional back, neck, and jaw discomfort. She underwent a stress Myoview scan that demonstrated no ischemia.    She has had a LINQ placed for palpitations and found to have paroxysmal atrial fibrillation. Anticoagulated with Eliquis. CHADSVASCs score of 3. Hx of fatigue on metoprolol. Her flecainide was discontinued per patients request in fall 2019.   When seen by Dr. Curt Bears 04/05/19, per note "She has 1% atrial fibrillation on her Linq monitor.  She also has PACs which appear to be causing most of her symptoms.  At this point, we will make no further changes as she wishes to be off of antiarrhythmics".    Seen here by Vin back in March of 2021- had progressive DOE for several months - no longer able to even walk to her mailbox. Also with hematuria. Echo was updated. Her LINQ did not show increased burden of PAF or PVCs. She was treated with a short course of diuretics - for 2 days and then just prn. I then saw her in follow up - she had had no improvement with diuretic therapy - seemed to have benefited more from cutting portions back and focusing on weight loss.   I last saw her in August - she was doing ok - saw Dr. Curt Bears back in September. Linq was explanted at that time.    Comes in today. Here alone.  She is doing ok. Continues to work on her weight - down 10 pounds. She is planning on trying to lose 10  more. Her breathing is stable. Has had some issues with getting her medicines - switching around pharmacies. No exertional chest pain but does note a squeezing type discomfort under the left breast or in the upper left chest that has been coming and going for the past month or so. Does not sound like getting worse. Not exertional. Father had MI right at 10. She has HLD. BP tends to be below 140 for the most part at home.   Past Medical History:  Diagnosis Date  . Anxiety   . AR (allergic rhinitis)   . Arthritis    knees  . Atrial fibrillation ([place])   . Cataract    bilateral /removed  . Depressed   . Glaucoma   . HTN (hypertension)   . Hyperlipidemia   . Overweight(278.02)     Past Surgical History:  Procedure Laterality Date  . ABDOMINAL HYSTERECTOMY    . CATARACT EXTRACTION    . catherization  11/00   normal  . COLONOSCOPY    . cyrotherapy    . EP IMPLANTABLE DEVICE N/A 03/02/2016   Procedure: Loop Recorder Insertion;  Surgeon: Will [name], MD;  Location: [facility];  Service: Cardiovascular;  Laterality: N/A;  . EYE SURGERY     x2  . GLAUCOMA SURGERY  bilateral  . KNEE ARTHROSCOPY     left knee  . ROTATOR CUFF REPAIR     right shoulder  . WISDOM TOOTH EXTRACTION       Medications: Current Meds  Medication Sig  . Ascorbic Acid (VITAMIN C) 1000 MG tablet Take 1,000 mg by mouth daily.  . Cholecalciferol (VITAMIN D3 MAXIMUM STRENGTH) 125 MCG (5000 UT) capsule Take 5,000 Units by mouth daily.  Marland Kitchen diltiazem (CARDIZEM CD) 180 MG 24 hr capsule Take 1 capsule (180 mg total) by mouth daily.  Marland Kitchen esomeprazole (NEXIUM) 40 MG capsule Take 1 capsule (40 mg total) by mouth daily.  . Garlic (GARLIQUE PO) Take 1 tablet by mouth daily.  . Multiple Vitamins-Minerals (OCUVITE PO) Take 1 tablet by mouth daily.  . Phendimetrazine Tartrate 105 MG CP24 TAKE ONE CAPSULE BY MOUTH DAILY  . predniSONE (DELTASONE) 10 MG tablet prednisone 10 mg tablet  take 1 tab three times a  day for 2 days, 1 tab twice a day for 5 days, 1 tab daily till finished  . rosuvastatin (CRESTOR) 10 MG tablet Take 1 tablet (10 mg total) by mouth daily.  . valsartan-hydrochlorothiazide (DIOVAN-HCT) 160-12.5 MG tablet Take 1 tablet by mouth daily.  . Zinc 50 MG CAPS Take 50 mg by mouth daily.   . [DISCONTINUED] apixaban (ELIQUIS) 5 MG TABS tablet TAKE 1 TABLET BY MOUTH TWICE(2) DAILY     Allergies: Allergies  Allergen Reactions  . Codeine Nausea Only    nasuea    Social History: The patient  reports that she has never smoked. She has never used smokeless tobacco. She reports that she does not drink alcohol and does not use drugs.   Family History: The patient's family history includes Heart attack in her father; Stroke in her mother. Dad had MI at 59.   Review of Systems: Please see the history of present illness.   All other systems are reviewed and negative.   Physical Exam: VS:  BP (!) 144/68   Pulse 61   Ht 5\' 8"  (1.727 m)   Wt 181 lb 6.4 oz (82.3 kg)   BMI 27.58 kg/m  .  BMI Body mass index is 27.58 kg/m.  Wt Readings from Last 3 Encounters:  05/20/20 181 lb 6.4 oz (82.3 kg)  12/14/19 191 lb 3.2 oz (86.7 kg)  11/20/19 192 lb (87.1 kg)    General: Alert and in no acute distress.  Her weight is down 10 pounds.  Cardiac: Regular rate and rhythm. Soft outflow murmur. No edema.  Respiratory:  Lungs are clear to auscultation bilaterally with normal work of breathing.  GI: Soft and nontender.  MS: No deformity or atrophy. Gait and ROM intact.  Skin: Warm and dry. Color is normal.  Neuro:  Strength and sensation are intact and no [name] focal deficits noted.  Psych: Alert, appropriate and with normal affect.   LABORATORY DATA:  EKG:  EKG is ordered today.  Personally reviewed by me. This demonstrates NSR - HR is 61 - probable U waves noted.  Lab Results  Component Value Date   WBC 9.2 12/14/2019   HGB 11.9 12/14/2019   HCT 35.8 12/14/2019   PLT 294 12/14/2019    GLUCOSE 102 (H) 12/14/2019   CHOL 134 11/20/2019   TRIG 101 11/20/2019   HDL 61 11/20/2019   LDLCALC 54 11/20/2019   ALT 23 11/20/2019   AST 20 11/20/2019   NA 143 12/14/2019   K 4.1 12/14/2019   CL 104 12/14/2019  CREATININE 1.37 (H) 12/14/2019   BUN 18 12/14/2019   CO2 25 12/14/2019   TSH 1.643 02/19/2016   INR 1.24 10/28/2016   HGBA1C 5.3 02/19/2016     BNP (last 3 results) No results for input(s): BNP in the last 8760 hours.  ProBNP (last 3 results) No results for input(s): PROBNP in the last 8760 hours.   Other Studies Reviewed Today:  ECHO IMPRESSIONS 06/2019  1. Left ventricular ejection fraction, by estimation, is 60 to 65%. The  left ventricle has normal function. The left ventricle has no regional  wall motion abnormalities. Left ventricular diastolic parameters are  indeterminate. Elevated left ventricular  end-diastolic pressure. The average left ventricular global longitudinal  strain is -22.3 %.   2. Right ventricular systolic function is normal. The right ventricular  size is normal.   3. The mitral valve is normal in structure. Trivial mitral valve  regurgitation. No evidence of mitral stenosis.   4. The aortic valve is tricuspid. Aortic valve regurgitation is not  visualized. Moderate aortic valve sclerosis/calcification is present,  without any evidence of aortic stenosis.   5. The inferior vena cava is normal in size with greater than 50%  respiratory variability, suggesting right atrial pressure of 3 mmHg.   Comparison(s): 02/19/16 EF 55-60%.    ASSESSMENT & PLAN:     1. Atypical chest pain - she would like to take a wait and see approach - she will keep a diary - may need to consider stress testing if persists.   2. DOE - this seems better with continued weight loss.   3. HTN - BP better at home - no changes made.   4. PAF - in sinus - on anticoagulation  5. Chronic anticoagulation - needs labs today - no problems noted.   6. HLD - on  statin - lab today.   7. Obesity - she continues to be successful with weight loss.   Current medicines are reviewed with the patient today.  The patient does not have concerns regarding medicines other than what has been noted above.  The following changes have been made:  See above.  Labs/ tests ordered today include:    Orders Placed This Encounter  Procedures  . Basic metabolic panel  . CBC  . Hepatic function panel  . Lipid panel  . EKG 12-Lead     Disposition:   FU with Dr. Curt Bears in March. Consider stress testing on return if fails to improve.    Patient is agreeable to this plan and will call if any problems develop in the interim.   SignedTruitt Merle, NP  05/20/2020 12:16 PM  [facility] [address] [place], [place]  [address] Phone: [phone] Fax: [phone]

## 2020-05-08 DIAGNOSIS — H402212 Chronic angle-closure glaucoma, right eye, moderate stage: Secondary | ICD-10-CM | POA: Diagnosis not present

## 2020-05-16 DIAGNOSIS — D485 Neoplasm of uncertain behavior of skin: Secondary | ICD-10-CM | POA: Diagnosis not present

## 2020-05-20 ENCOUNTER — Encounter: Payer: Self-pay | Admitting: Nurse Practitioner

## 2020-05-20 ENCOUNTER — Other Ambulatory Visit: Payer: Self-pay

## 2020-05-20 ENCOUNTER — Ambulatory Visit (INDEPENDENT_AMBULATORY_CARE_PROVIDER_SITE_OTHER): Payer: PPO | Admitting: Nurse Practitioner

## 2020-05-20 VITALS — BP 144/68 | HR 61 | Ht 68.0 in | Wt 181.4 lb

## 2020-05-20 DIAGNOSIS — Z7901 Long term (current) use of anticoagulants: Secondary | ICD-10-CM

## 2020-05-20 DIAGNOSIS — Z79899 Other long term (current) drug therapy: Secondary | ICD-10-CM | POA: Diagnosis not present

## 2020-05-20 DIAGNOSIS — I480 Paroxysmal atrial fibrillation: Secondary | ICD-10-CM

## 2020-05-20 DIAGNOSIS — I10 Essential (primary) hypertension: Secondary | ICD-10-CM | POA: Diagnosis not present

## 2020-05-20 DIAGNOSIS — E7849 Other hyperlipidemia: Secondary | ICD-10-CM | POA: Diagnosis not present

## 2020-05-20 LAB — CBC
Hematocrit: 35.6 % (ref 34.0–46.6)
Hemoglobin: 12 g/dL (ref 11.1–15.9)
MCH: 29.7 pg (ref 26.6–33.0)
MCHC: 33.7 g/dL (ref 31.5–35.7)
MCV: 88 fL (ref 79–97)
Platelets: 198 10*3/uL (ref 150–450)
RBC: 4.04 x10E6/uL (ref 3.77–5.28)
RDW: 13.1 % (ref 11.7–15.4)
WBC: 9.7 10*3/uL (ref 3.4–10.8)

## 2020-05-20 LAB — BASIC METABOLIC PANEL
BUN/Creatinine Ratio: 20 (ref 12–28)
BUN: 19 mg/dL (ref 8–27)
CO2: 23 mmol/L (ref 20–29)
Calcium: 9.6 mg/dL (ref 8.7–10.3)
Chloride: 101 mmol/L (ref 96–106)
Creatinine, Ser: 0.93 mg/dL (ref 0.57–1.00)
GFR calc Af Amer: 72 mL/min/{1.73_m2} (ref >59)
GFR calc non Af Amer: 62 mL/min/{1.73_m2} (ref >59)
Glucose: 106 mg/dL — ABNORMAL HIGH (ref 65–99)
Potassium: 4.3 mmol/L (ref 3.5–5.2)
Sodium: 140 mmol/L (ref 134–144)

## 2020-05-20 LAB — HEPATIC FUNCTION PANEL
ALT: 16 IU/L (ref 0–32)
AST: 22 IU/L (ref 0–40)
Albumin: 4.3 g/dL (ref 3.7–4.7)
Alkaline Phosphatase: 64 IU/L (ref 44–121)
Bilirubin Total: 0.8 mg/dL (ref 0.0–1.2)
Bilirubin, Direct: 0.19 mg/dL (ref 0.00–0.40)
Total Protein: 7 g/dL (ref 6.0–8.5)

## 2020-05-20 LAB — LIPID PANEL
Chol/HDL Ratio: 2.8 ratio (ref 0.0–4.4)
Cholesterol, Total: 129 mg/dL (ref 100–199)
HDL: 46 mg/dL (ref >39)
LDL Chol Calc (NIH): 58 mg/dL (ref 0–99)
Triglycerides: 145 mg/dL (ref 0–149)
VLDL Cholesterol Cal: 25 mg/dL (ref 5–40)

## 2020-05-20 MED ORDER — APIXABAN 5 MG PO TABS: ORAL_TABLET | ORAL | 3 refills | Status: DC

## 2020-05-20 NOTE — Patient Instructions (Addendum)
After Visit Summary:  We will be checking the following labs today - BMET, CBC, HPF and Lipids   Medication Instructions:    Continue with your current medicines.    If you need a refill on your cardiac medications before your next appointment, please call your pharmacy.     Testing/Procedures To Be Arranged:  N/A  Follow-Up:   See Dr. Curt Bears in March as planned.     At [facility], you and your health needs are our priority.  As part of our continuing mission to provide you with exceptional heart care, we have created designated Provider Care Teams.  These Care Teams include your primary Cardiologist (physician) and Advanced Practice Providers (APPs -  Physician Assistants and Nurse Practitioners) who all work together to provide you with the care you need, when you need it.  Special Instructions:  . Stay safe, wash your hands for at least 20 seconds and wear a mask when needed.  . It was good to talk with you today.  Marland Kitchen Keep a diary about your chest pain - we may need to proceed with stress testing when you come back next month   Call the [place] office at [phone] if you have any questions, problems or concerns.

## 2020-06-13 ENCOUNTER — Ambulatory Visit (INDEPENDENT_AMBULATORY_CARE_PROVIDER_SITE_OTHER): Payer: PPO | Admitting: Cardiology

## 2020-06-13 ENCOUNTER — Encounter: Payer: Self-pay | Admitting: Cardiology

## 2020-06-13 ENCOUNTER — Other Ambulatory Visit: Payer: Self-pay

## 2020-06-13 VITALS — BP 134/62 | HR 67 | Ht 68.0 in | Wt 183.0 lb

## 2020-06-13 DIAGNOSIS — I480 Paroxysmal atrial fibrillation: Secondary | ICD-10-CM | POA: Diagnosis not present

## 2020-06-13 DIAGNOSIS — R0789 Other chest pain: Secondary | ICD-10-CM | POA: Diagnosis not present

## 2020-06-13 LAB — BASIC METABOLIC PANEL
BUN/Creatinine Ratio: 21 (ref 12–28)
BUN: 18 mg/dL (ref 8–27)
CO2: 29 mmol/L (ref 20–29)
Calcium: 9.8 mg/dL (ref 8.7–10.3)
Chloride: 103 mmol/L (ref 96–106)
Creatinine, Ser: 0.87 mg/dL (ref 0.57–1.00)
Glucose: 101 mg/dL — ABNORMAL HIGH (ref 65–99)
Potassium: 4.5 mmol/L (ref 3.5–5.2)
Sodium: 141 mmol/L (ref 134–144)
eGFR: 71 mL/min/{1.73_m2} (ref >59)

## 2020-06-13 NOTE — Progress Notes (Signed)
Electrophysiology Office Note   Date:  06/13/2020   ID:  Vanessa [name], DOB 07-29-1950, MRN [id number]  PCP:  Myrlene Broker, MD  Cardiologist:  Burt Knack Primary Electrophysiologist:  Will [name], MD    No chief complaint on file.    History of Present Illness: Vanessa [name] is a 70 y.o. female who presents today for electrophysiology evaluation.     She has a history of exertional chest pain with negative Myoview in 2013, family history of coronary artery disease, hypertension, and hyperlipidemia.  She had a Linq monitor implanted for palpitations and was found to be in atrial fibrillation.  She has a 3% atrial fibrillation burden.  Today, denies symptoms of palpitations, shortness of breath, orthopnea, PND, lower extremity edema, claudication, dizziness, presyncope, syncope, bleeding, or neurologic sequela. The patient is tolerating medications without difficulties.  Unfortunately, she has developed some chest discomfort.  It occurs at random times throughout the day.  She has a squeezing on the left side of her chest under the breast but sometimes wraps around to her back.  She saw cardiology on 05/20/2020 with these complaints.  She has had 3 episodes since then.  On one episode, she got hot and sweaty as well as short of breath and dizzy.  This lasted for a few minutes.  She went to the bathroom and had a bowel movement and started to feel better.  Before that, she was walking around her house.  Past Medical History:  Diagnosis Date  . Anxiety   . AR (allergic rhinitis)   . Arthritis    knees  . Atrial fibrillation ([place])   . Cataract    bilateral /removed  . Depressed   . Glaucoma   . HTN (hypertension)   . Hyperlipidemia   . Overweight(278.02)    Past Surgical History:  Procedure Laterality Date  . ABDOMINAL HYSTERECTOMY    . CATARACT EXTRACTION    . catherization  11/00   normal  . COLONOSCOPY    . cyrotherapy    . EP IMPLANTABLE DEVICE N/A 03/02/2016    Procedure: Loop Recorder Insertion;  Surgeon: Will [name], MD;  Location: [facility];  Service: Cardiovascular;  Laterality: N/A;  . EYE SURGERY     x2  . GLAUCOMA SURGERY     bilateral  . KNEE ARTHROSCOPY     left knee  . ROTATOR CUFF REPAIR     right shoulder  . WISDOM TOOTH EXTRACTION       Current Outpatient Medications  Medication Sig Dispense Refill  . apixaban (ELIQUIS) 5 MG TABS tablet TAKE 1 TABLET BY MOUTH TWICE(2) DAILY 180 tablet 3  . Ascorbic Acid (VITAMIN C) 1000 MG tablet Take 1,000 mg by mouth daily.    . Cholecalciferol (VITAMIN D3 MAXIMUM STRENGTH) 125 MCG (5000 UT) capsule Take 5,000 Units by mouth daily.    Marland Kitchen diltiazem (CARDIZEM CD) 180 MG 24 hr capsule Take 1 capsule (180 mg total) by mouth daily. 90 capsule 3  . esomeprazole (NEXIUM) 40 MG capsule Take 1 capsule (40 mg total) by mouth daily. 30 capsule 6  . Garlic (GARLIQUE PO) Take 1 tablet by mouth daily.    . Multiple Vitamins-Minerals (OCUVITE PO) Take 1 tablet by mouth daily.    . Phendimetrazine Tartrate 105 MG CP24 TAKE ONE CAPSULE BY MOUTH DAILY    . predniSONE (DELTASONE) 10 MG tablet prednisone 10 mg tablet  take 1 tab three times a day for 2 days, 1 tab  twice a day for 5 days, 1 tab daily till finished    . rosuvastatin (CRESTOR) 10 MG tablet Take 1 tablet (10 mg total) by mouth daily. 90 tablet 3  . valsartan-hydrochlorothiazide (DIOVAN-HCT) 160-12.5 MG tablet Take 1 tablet by mouth daily. 90 tablet 3  . Zinc 50 MG CAPS Take 50 mg by mouth daily.      No current facility-administered medications for this visit.    Allergies:   Codeine   Social History:  The patient  reports that she has never smoked. She has never used smokeless tobacco. She reports that she does not drink alcohol and does not use drugs.   Family History:  The patient's family history includes Heart attack in her father; Stroke in her mother.   ROS:  Please see the history of present illness.   Otherwise, review  of systems is positive for none.   All other systems are reviewed and negative.   PHYSICAL EXAM: VS:  BP 134/62   Pulse 67   Ht 5\' 8"  (1.727 m)   Wt 183 lb (83 kg)   SpO2 97%   BMI 27.83 kg/m  , BMI Body mass index is 27.83 kg/m. GEN: Well nourished, well developed, in no acute distress  HEENT: normal  Neck: no JVD, carotid bruits, or masses Cardiac: RRR; no murmurs, rubs, or gallops,no edema  Respiratory:  clear to auscultation bilaterally, normal work of breathing GI: soft, nontender, nondistended, + BS MS: no deformity or atrophy  Skin: warm and dry, device site well healed Neuro:  Strength and sensation are intact Psych: euthymic mood, full affect  EKG:  EKG is not ordered today. Personal review of the ekg ordered 05/21/19 shows sinus rhythm, rate 61  Personal review of the device interrogation today. Results in [place]: 05/20/2020: ALT 16; BUN 19; Creatinine, Ser 0.93; Hemoglobin 12.0; Platelets 198; Potassium 4.3; Sodium 140    Lipid Panel     Component Value Date/Time   CHOL 129 05/20/2020 1216   TRIG 145 05/20/2020 1216   HDL 46 05/20/2020 1216   CHOLHDL 2.8 05/20/2020 1216   LDLCALC 58 05/20/2020 1216     Wt Readings from Last 3 Encounters:  06/13/20 183 lb (83 kg)  05/20/20 181 lb 6.4 oz (82.3 kg)  12/14/19 191 lb 3.2 oz (86.7 kg)      Other studies Reviewed: Additional studies/ records that were reviewed today include: TTE 02/19/16  Review of the above records today demonstrates:  - Left ventricle: The cavity size was normal. There was mild focal   basal hypertrophy of the septum. Systolic function was normal.   The estimated ejection fraction was in the range of 55% to 60%.   Wall motion was normal; there were no regional wall motion   abnormalities. Doppler parameters are consistent with abnormal   left ventricular relaxation (grade 1 diastolic dysfunction). - Mitral valve: There was mild regurgitation. - Left atrium: The atrium was  mildly dilated.  ASSESSMENT AND PLAN:  1.  Paroxysmal atrial fibrillation: Currently on Eliquis with a CHA2DS2-VASc of 3.  She had a prior Linq monitor that has since been explanted.  No further episodes of atrial fibrillation noted.  No changes.  2.  Hypertension: Currently well controlled  3.  Atypical chest pain: Pain is quite atypical, though she does have a known calcium in her coronary arteries.  She has episodes that sound to be vagal, but could also be coronary related.  We will get  a coronary CTA.   Current medicines are reviewed at length with the patient today.   The patient does not have concerns regarding her medicines.  The following changes were made today: None  Labs/ tests ordered today include:  Orders Placed This Encounter  Procedures  . CT CORONARY MORPH W/CTA COR W/SCORE W/CA W/CM &/OR WO/CM  . CT CORONARY FRACTIONAL FLOW RESERVE DATA PREP  . CT CORONARY FRACTIONAL FLOW RESERVE FLUID ANALYSIS  . Basic metabolic panel     Disposition:   FU with Will Camnitz 6 months  Signed, Will [name], MD  06/13/2020 11:08 AM     [facility] [address] [place] [address] [phone] (office) [phone] (fax)

## 2020-06-13 NOTE — Addendum Note (Signed)
Addended by: Eulis Foster on: 06/13/2020 11:09 AM   Modules accepted: Orders

## 2020-06-13 NOTE — Patient Instructions (Addendum)
Medication Instructions:  Your physician recommends that you continue on your current medications as directed. Please refer to the Current Medication list given to you today.  *If you need a refill on your cardiac medications before your next appointment, please call your pharmacy*   Lab Work: Today: BMET If you have labs (blood work) drawn today and your tests are completely normal, you will receive your results only by: Marland Kitchen MyChart Message (if you have MyChart) OR . A paper copy in the mail If you have any lab test that is abnormal or we need to change your treatment, we will call you to review the results.   Testing/Procedures: Your physician has requested that you have cardiac CT. Cardiac computed tomography (CT) is a painless test that uses an x-ray machine to take clear, detailed pictures of your heart.  Please follow instructions below located under "other instructions".     Follow-Up: At [facility], you and your health needs are our priority.  As part of our continuing mission to provide you with exceptional heart care, we have created designated Provider Care Teams.  These Care Teams include your primary Cardiologist (physician) and Advanced Practice Providers (APPs -  Physician Assistants and Nurse Practitioners) who all work together to provide you with the care you need, when you need it.    Your next appointment:   6 month(s)  The format for your next appointment:   In Person  Provider:   Allegra Lai, MD    Thank you for choosing [facility]!!   Trinidad Curet, RN [phone]   Other Instructions  Your cardiac CT will be scheduled at one of the below locations:   [facility] [address] [place], [place] [address] [phone]  [place] [address] [place], [place] [address] [phone]  If scheduled at [facility], please arrive at the [facility] main entrance  (entrance A) of [facility] 30 minutes prior to test start time. Proceed to the [facility] Radiology Department (first floor) to check-in and test prep.  If scheduled at [facility], please arrive 15 mins early for check-in and test prep.  Please follow these instructions carefully (unless otherwise directed):  On the Night Before the Test: . Be sure to Drink plenty of water. . Do not consume any caffeinated/decaffeinated beverages or chocolate 12 hours prior to your test. . Do not take any antihistamines 12 hours prior to your test.  On the Day of the Test: . Drink plenty of water until 1 hour prior to the test. . Do not eat any food 4 hours prior to the test. . You may take your regular medications prior to the test.  . Take your Diltiazem two hours prior to test. . HOLD Furosemide/Hydrochlorothiazide morning of the test. . FEMALES- please wear underwire-free bra if available       After the Test: . Drink plenty of water. . After receiving IV contrast, you may experience a mild flushed feeling. This is normal. . On occasion, you may experience a mild rash up to 24 hours after the test. This is not dangerous. If this occurs, you can take Benadryl 25 mg and increase your fluid intake. . If you experience trouble breathing, this can be serious. If it is severe call 911 IMMEDIATELY. If it is mild, please call our office. . If you take any of these medications: Glipizide/Metformin, Avandament, Glucavance, please do not  take 48 hours after completing test unless otherwise instructed.   Once we have confirmed authorization from your insurance company, we will call you to set up a date and time for your test. Based on how quickly your insurance processes prior authorizations requests, please allow up to 4 weeks to be contacted for scheduling your Cardiac CT appointment. Be advised that routine Cardiac CT appointments could be scheduled as many as 8 weeks after  your provider has ordered it.  For non-scheduling related questions, please contact the cardiac imaging nurse navigator should you have any questions/concerns: Marchia Bond, Cardiac Imaging Nurse Navigator Gordy Clement, Cardiac Imaging Nurse Navigator [facility] Direct Office Dial: [phone]   For scheduling needs, including cancellations and rescheduling, please call [place], [phone].

## 2020-06-19 ENCOUNTER — Telehealth (HOSPITAL_COMMUNITY): Payer: Self-pay | Admitting: *Deleted

## 2020-06-19 NOTE — Telephone Encounter (Signed)
Reaching out to patient to offer assistance regarding upcoming cardiac imaging study; pt verbalizes understanding of appt date/time, parking situation and where to check in, pre-test NPO status and medications ordered, and verified current allergies; name and call back number provided for further questions should they arise  Merle Prescott RN Navigator Cardiac Imaging [facility] [phone] office [phone] cell  

## 2020-06-21 ENCOUNTER — Other Ambulatory Visit: Payer: Self-pay

## 2020-06-21 ENCOUNTER — Ambulatory Visit (HOSPITAL_COMMUNITY)
Admission: RE | Admit: 2020-06-21 | Discharge: 2020-06-21 | Disposition: A | Discharge status: Home / Self Care | Payer: PPO | Source: Ambulatory Visit | Attending: Cardiology | Admitting: Cardiology

## 2020-06-21 DIAGNOSIS — I700 Atherosclerosis of aorta: Secondary | ICD-10-CM | POA: Diagnosis not present

## 2020-06-21 DIAGNOSIS — R0789 Other chest pain: Secondary | ICD-10-CM | POA: Diagnosis not present

## 2020-06-21 DIAGNOSIS — I2510 Atherosclerotic heart disease of native coronary artery without angina pectoris: Secondary | ICD-10-CM | POA: Insufficient documentation

## 2020-06-21 MED ORDER — NITROGLYCERIN 0.4 MG SL SUBL
SUBLINGUAL_TABLET | SUBLINGUAL | Status: AC
  Filled 2020-06-21: qty 2

## 2020-06-21 MED ORDER — NITROGLYCERIN 0.4 MG SL SUBL
0.8000 mg | SUBLINGUAL_TABLET | Freq: Once | SUBLINGUAL | Status: AC
  Administered 2020-06-21: 0.8 mg via SUBLINGUAL

## 2020-06-21 MED ORDER — IOHEXOL 350 MG/ML SOLN
80.0000 mL | Freq: Once | INTRAVENOUS | Status: AC | PRN
  Administered 2020-06-21: 80 mL via INTRAVENOUS

## 2020-06-23 ENCOUNTER — Ambulatory Visit (HOSPITAL_COMMUNITY)
Admission: RE | Admit: 2020-06-23 | Discharge: 2020-06-23 | Disposition: A | Discharge status: Home / Self Care | Payer: PPO | Source: Ambulatory Visit | Attending: Cardiology | Admitting: Cardiology

## 2020-06-23 DIAGNOSIS — R0789 Other chest pain: Secondary | ICD-10-CM | POA: Insufficient documentation

## 2020-06-25 ENCOUNTER — Telehealth: Payer: Self-pay | Admitting: Cardiology

## 2020-06-25 DIAGNOSIS — I2510 Atherosclerotic heart disease of native coronary artery without angina pectoris: Secondary | ICD-10-CM | POA: Diagnosis not present

## 2020-06-25 NOTE — Telephone Encounter (Signed)
Pt made aware Dr. Curt Bears is out of the office this week and will not review CT result until he returns. Aware we will contact her w/ his recommendation, if any, next week. Patient verbalized understanding and agreeable to plan.

## 2020-06-25 NOTE — Telephone Encounter (Signed)
New message:    Patient calling to check the status of her CT results.

## 2020-06-26 NOTE — H&P (View-Only) (Signed)
PCP:  Myrlene Broker, MD Primary Cardiologist: Sherren Mocha, MD Electrophysiologist: Constance Haw, MD   Vanessa [name] is a 70 y.o. female seen today for Will [name], MD for discussion of CTA results and consent for LHC.  Since last being seen in our clinic the patient reports doing OK. She continues to have intermittent chest pain that usually only lasts for minutes. It has no clear exertional component, and can at times be positional. With significant CAD noted on CTA, it was felt best to proceed with LHC. At times she has associated SOB and diaphoresis with these episodes.   Device history H/o loop recorder that has since been explanted  Past Medical History:  Diagnosis Date  . Anxiety   . AR (allergic rhinitis)   . Arthritis    knees  . Atrial fibrillation ([place])   . Cataract    bilateral /removed  . Depressed   . Glaucoma   . HTN (hypertension)   . Hyperlipidemia   . Overweight(278.02)    Past Surgical History:  Procedure Laterality Date  . ABDOMINAL HYSTERECTOMY    . CATARACT EXTRACTION    . catherization  11/00   normal  . COLONOSCOPY    . cyrotherapy    . EP IMPLANTABLE DEVICE N/A 03/02/2016   Procedure: Loop Recorder Insertion;  Surgeon: Will [name], MD;  Location: [facility];  Service: Cardiovascular;  Laterality: N/A;  . EYE SURGERY     x2  . GLAUCOMA SURGERY     bilateral  . KNEE ARTHROSCOPY     left knee  . ROTATOR CUFF REPAIR     right shoulder  . WISDOM TOOTH EXTRACTION      Current Outpatient Medications  Medication Sig Dispense Refill  . apixaban (ELIQUIS) 5 MG TABS tablet TAKE 1 TABLET BY MOUTH TWICE(2) DAILY 180 tablet 3  . Ascorbic Acid (VITAMIN C) 1000 MG tablet Take 1,000 mg by mouth daily.    . Cholecalciferol (VITAMIN D3 MAXIMUM STRENGTH) 125 MCG (5000 UT) capsule Take 5,000 Units by mouth daily.    Marland Kitchen diltiazem (CARDIZEM CD) 180 MG 24 hr capsule Take 1 capsule (180 mg total) by mouth daily. 90 capsule 3  .  esomeprazole (NEXIUM) 40 MG capsule Take 1 capsule (40 mg total) by mouth daily. 30 capsule 6  . Garlic (GARLIQUE PO) Take 1 tablet by mouth daily.    . Multiple Vitamins-Minerals (OCUVITE PO) Take 1 tablet by mouth daily.    . Phendimetrazine Tartrate 105 MG CP24 TAKE ONE CAPSULE BY MOUTH DAILY    . predniSONE (DELTASONE) 10 MG tablet prednisone 10 mg tablet  take 1 tab three times a day for 2 days, 1 tab twice a day for 5 days, 1 tab daily till finished    . rosuvastatin (CRESTOR) 10 MG tablet Take 1 tablet (10 mg total) by mouth daily. 90 tablet 3  . valsartan-hydrochlorothiazide (DIOVAN-HCT) 160-12.5 MG tablet Take 1 tablet by mouth daily. 90 tablet 3  . Zinc 50 MG CAPS Take 50 mg by mouth daily.      No current facility-administered medications for this visit.    Allergies  Allergen Reactions  . Codeine Nausea Only    nasuea    Social History   Socioeconomic History  . Marital status: Married    Spouse name: Not on file  . Number of children: Not on file  . Years of education: Not on file  . Highest education level: Not on file  Occupational History  . 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 Smoking status: Never Smoker  . Smokeless tobacco: Never Used  Substance and Sexual Activity  . Alcohol use: No  . Drug use: No  . Sexual activity: Not on file  Other Topics Concern  . Not on file  Social History Narrative   Lives in [place], [place]. Helps take care of her mother.    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   Social Determinants of Health   Financial Resource Strain: Not on file  Food Insecurity: Not on file  Transportation Needs: Not on file  Physical Activity: Not on file  Stress: Not on file  Social Connections: Not on file  Intimate Partner Violence: Not on file     Review of Systems: General: No chills, fever, night sweats or weight changes  Cardiovascular:  No chest pain, dyspnea on exertion, edema, orthopnea,  palpitations, paroxysmal nocturnal dyspnea Dermatological: No rash, lesions or masses Respiratory: No cough, dyspnea Urologic: No hematuria, dysuria Abdominal: No nausea, vomiting, diarrhea, bright red blood per rectum, melena, or hematemesis Neurologic: No visual changes, weakness, changes in mental status All other systems reviewed and are otherwise negative except as noted above.  Physical Exam: There were no vitals filed for this visit.  GEN- The patient is well appearing, alert and oriented x 3 today.   HEENT: normocephalic, atraumatic; sclera clear, conjunctiva pink; hearing intact; oropharynx clear; neck supple, no JVP Lymph- no cervical lymphadenopathy Lungs- Clear to ausculation bilaterally, normal work of breathing.  No wheezes, rales, rhonchi Heart- Regular rate and rhythm, no murmurs, rubs or gallops, PMI not laterally displaced GI- soft, non-tender, non-distended, bowel sounds present, no hepatosplenomegaly Extremities- no clubbing, cyanosis, or edema; DP/PT/radial pulses 2+ bilaterally MS- no significant deformity or atrophy Skin- warm and dry, no rash or lesion Psych- euthymic mood, full affect Neuro- strength and sensation are intact  EKG is not ordered.  Additional studies reviewed include: Dr. Curt Bears' notes. Coronary CTA  Assessment and Plan:  1. PAF Continue eliquis with CHA2DS2VASC of at least 3.   2. HTN Continue current regimen  3. Chest pain, atypical Although her symptoms are quite atypical, Coronary CTA showed possible flow limiting lesions in the mid LAD, mid Diagonal. Mid LCx,, mid OM1, and mid RCA with an occlude Ramus. She is thus recommended for heart catheterization.  The patient understands that risks included but are not limited to stroke (1 in 1000), death (1 in 60), kidney failure [usually temporary] (1 in 500), bleeding (1 in 200), allergic reaction [possibly serious] (1 in 200).  The patient understands and agrees to proceed.   Labs  today. Next available cath is Monday with Dr. Saunders Revel. RTC 3 weeks for post follow up, sooner with any issues.   Shirley Friar, PA-C  06/26/20 2:03 PM

## 2020-06-26 NOTE — Progress Notes (Unsigned)
PCP:  Myrlene Broker, MD Primary Cardiologist: Sherren Mocha, MD Electrophysiologist: Constance Haw, MD   Vanessa [name] is a 70 y.o. female seen today for Will [name], MD for discussion of CTA results and consent for LHC.  Since last being seen in our clinic the patient reports doing OK. She continues to have intermittent chest pain that usually only lasts for minutes. It has no clear exertional component, and can at times be positional. With significant CAD noted on CTA, it was felt best to proceed with LHC. At times she has associated SOB and diaphoresis with these episodes.   Device history H/o loop recorder that has since been explanted  Past Medical History:  Diagnosis Date  . Anxiety   . AR (allergic rhinitis)   . Arthritis    knees  . Atrial fibrillation ([place])   . Cataract    bilateral /removed  . Depressed   . Glaucoma   . HTN (hypertension)   . Hyperlipidemia   . Overweight(278.02)    Past Surgical History:  Procedure Laterality Date  . ABDOMINAL HYSTERECTOMY    . CATARACT EXTRACTION    . catherization  11/00   normal  . COLONOSCOPY    . cyrotherapy    . EP IMPLANTABLE DEVICE N/A 03/02/2016   Procedure: Loop Recorder Insertion;  Surgeon: Will [name], MD;  Location: [facility];  Service: Cardiovascular;  Laterality: N/A;  . EYE SURGERY     x2  . GLAUCOMA SURGERY     bilateral  . KNEE ARTHROSCOPY     left knee  . ROTATOR CUFF REPAIR     right shoulder  . WISDOM TOOTH EXTRACTION      Current Outpatient Medications  Medication Sig Dispense Refill  . apixaban (ELIQUIS) 5 MG TABS tablet TAKE 1 TABLET BY MOUTH TWICE(2) DAILY 180 tablet 3  . Ascorbic Acid (VITAMIN C) 1000 MG tablet Take 1,000 mg by mouth daily.    . Cholecalciferol (VITAMIN D3 MAXIMUM STRENGTH) 125 MCG (5000 UT) capsule Take 5,000 Units by mouth daily.    Marland Kitchen diltiazem (CARDIZEM CD) 180 MG 24 hr capsule Take 1 capsule (180 mg total) by mouth daily. 90 capsule 3  .  esomeprazole (NEXIUM) 40 MG capsule Take 1 capsule (40 mg total) by mouth daily. 30 capsule 6  . Garlic (GARLIQUE PO) Take 1 tablet by mouth daily.    . Multiple Vitamins-Minerals (OCUVITE PO) Take 1 tablet by mouth daily.    . Phendimetrazine Tartrate 105 MG CP24 TAKE ONE CAPSULE BY MOUTH DAILY    . predniSONE (DELTASONE) 10 MG tablet prednisone 10 mg tablet  take 1 tab three times a day for 2 days, 1 tab twice a day for 5 days, 1 tab daily till finished    . rosuvastatin (CRESTOR) 10 MG tablet Take 1 tablet (10 mg total) by mouth daily. 90 tablet 3  . valsartan-hydrochlorothiazide (DIOVAN-HCT) 160-12.5 MG tablet Take 1 tablet by mouth daily. 90 tablet 3  . Zinc 50 MG CAPS Take 50 mg by mouth daily.      No current facility-administered medications for this visit.    Allergies  Allergen Reactions  . Codeine Nausea Only    nasuea    Social History   Socioeconomic History  . Marital status: Married    Spouse name: Not on file  . Number of children: Not on file  . Years of education: Not on file  . Highest education level: Not on file  Occupational History  . 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 Smoking status: Never Smoker  . Smokeless tobacco: Never Used  Substance and Sexual Activity  . Alcohol use: No  . Drug use: No  . Sexual activity: Not on file  Other Topics Concern  . Not on file  Social History Narrative   Lives in [place], [place]. Helps take care of her mother.    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   Social Determinants of Health   Financial Resource Strain: Not on file  Food Insecurity: Not on file  Transportation Needs: Not on file  Physical Activity: Not on file  Stress: Not on file  Social Connections: Not on file  Intimate Partner Violence: Not on file     Review of Systems: General: No chills, fever, night sweats or weight changes  Cardiovascular:  No chest pain, dyspnea on exertion, edema, orthopnea,  palpitations, paroxysmal nocturnal dyspnea Dermatological: No rash, lesions or masses Respiratory: No cough, dyspnea Urologic: No hematuria, dysuria Abdominal: No nausea, vomiting, diarrhea, bright red blood per rectum, melena, or hematemesis Neurologic: No visual changes, weakness, changes in mental status All other systems reviewed and are otherwise negative except as noted above.  Physical Exam: There were no vitals filed for this visit.  GEN- The patient is well appearing, alert and oriented x 3 today.   HEENT: normocephalic, atraumatic; sclera clear, conjunctiva pink; hearing intact; oropharynx clear; neck supple, no JVP Lymph- no cervical lymphadenopathy Lungs- Clear to ausculation bilaterally, normal work of breathing.  No wheezes, rales, rhonchi Heart- Regular rate and rhythm, no murmurs, rubs or gallops, PMI not laterally displaced GI- soft, non-tender, non-distended, bowel sounds present, no hepatosplenomegaly Extremities- no clubbing, cyanosis, or edema; DP/PT/radial pulses 2+ bilaterally MS- no significant deformity or atrophy Skin- warm and dry, no rash or lesion Psych- euthymic mood, full affect Neuro- strength and sensation are intact  EKG is not ordered.  Additional studies reviewed include: Dr. Curt Bears' notes. Coronary CTA  Assessment and Plan:  1. PAF Continue eliquis with CHA2DS2VASC of at least 3.   2. HTN Continue current regimen  3. Chest pain, atypical Although her symptoms are quite atypical, Coronary CTA showed possible flow limiting lesions in the mid LAD, mid Diagonal. Mid LCx,, mid OM1, and mid RCA with an occlude Ramus. She is thus recommended for heart catheterization.  The patient understands that risks included but are not limited to stroke (1 in 1000), death (1 in 14), kidney failure [usually temporary] (1 in 500), bleeding (1 in 200), allergic reaction [possibly serious] (1 in 200).  The patient understands and agrees to proceed.   Labs  today. Next available cath is Monday with Dr. Saunders Revel. RTC 3 weeks for post follow up, sooner with any issues.   Shirley Friar, PA-C  06/26/20 2:03 PM

## 2020-06-27 ENCOUNTER — Ambulatory Visit (INDEPENDENT_AMBULATORY_CARE_PROVIDER_SITE_OTHER): Payer: PPO | Admitting: Student

## 2020-06-27 ENCOUNTER — Other Ambulatory Visit: Payer: Self-pay

## 2020-06-27 ENCOUNTER — Other Ambulatory Visit (HOSPITAL_COMMUNITY)
Admission: RE | Admit: 2020-06-27 | Discharge: 2020-06-27 | Disposition: A | Discharge status: Home / Self Care | Payer: PPO | Source: Ambulatory Visit | Attending: Internal Medicine | Admitting: Internal Medicine

## 2020-06-27 VITALS — BP 162/80 | HR 60 | Ht 68.0 in | Wt 181.0 lb

## 2020-06-27 DIAGNOSIS — Z01812 Encounter for preprocedural laboratory examination: Secondary | ICD-10-CM | POA: Insufficient documentation

## 2020-06-27 DIAGNOSIS — R0789 Other chest pain: Secondary | ICD-10-CM | POA: Diagnosis not present

## 2020-06-27 DIAGNOSIS — I480 Paroxysmal atrial fibrillation: Secondary | ICD-10-CM

## 2020-06-27 DIAGNOSIS — Z20822 Contact with and (suspected) exposure to covid-19: Secondary | ICD-10-CM | POA: Insufficient documentation

## 2020-06-27 LAB — SARS CORONAVIRUS 2 (TAT 6-24 HRS): SARS Coronavirus 2: NEGATIVE

## 2020-06-27 MED ORDER — NITROGLYCERIN 0.4 MG SL SUBL: 0.4000 mg | SUBLINGUAL_TABLET | SUBLINGUAL | 3 refills | Status: DC | PRN

## 2020-06-27 NOTE — Patient Instructions (Addendum)
Medication Instructions:  Your physician has recommended you make the following change in your medication:   START: Nitroglycerin 0.4mg  tablet as needed.   *If you need a refill on your cardiac medications before your next appointment, please call your pharmacy*   Lab Work: TODAY: BMET, CBC  If you have labs (blood work) drawn today and your tests are completely normal, you will receive your results only by: Marland Kitchen MyChart Message (if you have MyChart) OR . A paper copy in the mail If you have any lab test that is abnormal or we need to change your treatment, we will call you to review the results.  Follow-Up: At [facility], you and your health needs are our priority.  As part of our continuing mission to provide you with exceptional heart care, we have created designated Provider Care Teams.  These Care Teams include your primary Cardiologist (physician) and Advanced Practice Providers (APPs -  Physician Assistants and Nurse Practitioners) who all work together to provide you with the care you need, when you need it.  Your next appointment:   07/22/2020 with Oda Kilts, [place] OFF[facility] [place], [place] [address] Dept: [place]: [phone]  Vanessa [name]  06/27/2020  You are scheduled for a Cardiac Catheterization on Monday, March 21 with Dr. Harrell Gave End.  1. Please arrive at the [facility] (Main Entrance A) at [facility]: [place], [place] [address] at 5:30 AM (This time is two hours before your procedure to ensure your preparation). Free valet parking service is available.   Special note: Every effort is made to have your procedure done on time. Please understand that emergencies sometimes delay scheduled procedures.  2. Diet: Do not eat solid foods after midnight.  The patient may have clear liquids until 5am upon the day of the  procedure.  3. Labs: You will need to have blood drawn today.  4. Medication instructions in preparation for your procedure:   Contrast Allergy: No Stop taking Eliquis (Apixiban) on Saturday, March 19.   On the morning of your procedure, take  Aspirin and any morning medicines NOT listed above.  You may use sips of water.  5. Plan for one night stay--bring personal belongings. 6. Bring a current list of your medications and current insurance cards. 7. You MUST have a responsible person to drive you home. 8. Someone MUST be with you the first 24 hours after you arrive home or your discharge will be delayed. 9. Please wear clothes that are easy to get on and off and wear slip-on shoes.  Thank you for allowing [place] to care for you!   -- [facility]  Due to recent COVID-19 restrictions implemented by our local and state authorities and in an effort to keep both patients and staff as safe as possible, our hospital system requires COVID-19 testing prior to certain scheduled hospital procedures.  Please go to [place]. [place],  [id number] on 06/27/2020 at 1:30  .  This is a drive up testing site.  You will not need to exit your vehicle.  You will not be billed at the time of testing but may receive a bill later depending on your insurance. You must agree to self-quarantine from the time of your testing until the procedure date on 07/01/2020.  This should included staying home with ONLY the people you live  with.  Avoid take-out, grocery store shopping or leaving the house for any non-emergent reason.  Failure to have your COVID-19 test done on the date and time you have been scheduled will result in cancellation of your procedure.  Please call our office at [phone] if you have any questions.

## 2020-06-28 LAB — BASIC METABOLIC PANEL
BUN/Creatinine Ratio: 20 (ref 12–28)
BUN: 20 mg/dL (ref 8–27)
CO2: 22 mmol/L (ref 20–29)
Calcium: 9.6 mg/dL (ref 8.7–10.3)
Chloride: 101 mmol/L (ref 96–106)
Creatinine, Ser: 0.99 mg/dL (ref 0.57–1.00)
Glucose: 102 mg/dL — ABNORMAL HIGH (ref 65–99)
Potassium: 4.2 mmol/L (ref 3.5–5.2)
Sodium: 139 mmol/L (ref 134–144)
eGFR: 61 mL/min/{1.73_m2} (ref >59)

## 2020-06-28 LAB — CBC
Hematocrit: 35.7 % (ref 34.0–46.6)
Hemoglobin: 12.3 g/dL (ref 11.1–15.9)
MCH: 30.7 pg (ref 26.6–33.0)
MCHC: 34.5 g/dL (ref 31.5–35.7)
MCV: 89 fL (ref 79–97)
Platelets: 193 10*3/uL (ref 150–450)
RBC: 4.01 x10E6/uL (ref 3.77–5.28)
RDW: 13.1 % (ref 11.7–15.4)
WBC: 8.9 10*3/uL (ref 3.4–10.8)

## 2020-07-01 ENCOUNTER — Ambulatory Visit (HOSPITAL_COMMUNITY)
Admission: RE | Disposition: A | Discharge status: Home / Self Care | Payer: Self-pay | Source: Home / Self Care | Attending: Internal Medicine

## 2020-07-01 ENCOUNTER — Ambulatory Visit (HOSPITAL_COMMUNITY)
Admission: RE | Admit: 2020-07-01 | Discharge: 2020-07-01 | Disposition: A | Discharge status: Home / Self Care | Payer: PPO | Attending: Internal Medicine | Admitting: Internal Medicine

## 2020-07-01 ENCOUNTER — Encounter (HOSPITAL_COMMUNITY): Payer: Self-pay | Admitting: Internal Medicine

## 2020-07-01 ENCOUNTER — Other Ambulatory Visit: Payer: Self-pay

## 2020-07-01 DIAGNOSIS — I2510 Atherosclerotic heart disease of native coronary artery without angina pectoris: Secondary | ICD-10-CM | POA: Diagnosis not present

## 2020-07-01 DIAGNOSIS — Z885 Allergy status to narcotic agent status: Secondary | ICD-10-CM | POA: Insufficient documentation

## 2020-07-01 DIAGNOSIS — Z79899 Other long term (current) drug therapy: Secondary | ICD-10-CM | POA: Insufficient documentation

## 2020-07-01 DIAGNOSIS — E785 Hyperlipidemia, unspecified: Secondary | ICD-10-CM | POA: Insufficient documentation

## 2020-07-01 DIAGNOSIS — Z90710 Acquired absence of both cervix and uterus: Secondary | ICD-10-CM | POA: Insufficient documentation

## 2020-07-01 DIAGNOSIS — I25119 Atherosclerotic heart disease of native coronary artery with unspecified angina pectoris: Secondary | ICD-10-CM | POA: Diagnosis not present

## 2020-07-01 DIAGNOSIS — Z7901 Long term (current) use of anticoagulants: Secondary | ICD-10-CM | POA: Insufficient documentation

## 2020-07-01 DIAGNOSIS — R931 Abnormal findings on diagnostic imaging of heart and coronary circulation: Secondary | ICD-10-CM | POA: Diagnosis present

## 2020-07-01 DIAGNOSIS — I10 Essential (primary) hypertension: Secondary | ICD-10-CM | POA: Insufficient documentation

## 2020-07-01 DIAGNOSIS — I480 Paroxysmal atrial fibrillation: Secondary | ICD-10-CM | POA: Insufficient documentation

## 2020-07-01 DIAGNOSIS — R079 Chest pain, unspecified: Secondary | ICD-10-CM | POA: Diagnosis present

## 2020-07-01 HISTORY — PX: LEFT HEART CATH AND CORONARY ANGIOGRAPHY: CATH118249

## 2020-07-01 SURGERY — LEFT HEART CATH AND CORONARY ANGIOGRAPHY
Anesthesia: LOCAL

## 2020-07-01 MED ORDER — MIDAZOLAM HCL 2 MG/2ML IJ SOLN
INTRAMUSCULAR | Status: DC | PRN
  Administered 2020-07-01: 1 mg via INTRAVENOUS

## 2020-07-01 MED ORDER — ACETAMINOPHEN 325 MG PO TABS: 650.0000 mg | ORAL_TABLET | ORAL | Status: DC | PRN

## 2020-07-01 MED ORDER — HEPARIN (PORCINE) IN NACL 1000-0.9 UT/500ML-% IV SOLN
INTRAVENOUS | Status: AC
  Filled 2020-07-01: qty 1000

## 2020-07-01 MED ORDER — SODIUM CHLORIDE 0.9% FLUSH: 3.0000 mL | INTRAVENOUS | Status: DC | PRN

## 2020-07-01 MED ORDER — ONDANSETRON HCL 4 MG/2ML IJ SOLN: 4.0000 mg | Freq: Four times a day (QID) | INTRAMUSCULAR | Status: DC | PRN

## 2020-07-01 MED ORDER — SODIUM CHLORIDE 0.9 % IV SOLN: INTRAVENOUS | Status: DC

## 2020-07-01 MED ORDER — SODIUM CHLORIDE 0.9 % WEIGHT BASED INFUSION: 1.0000 mL/kg/h | INTRAVENOUS | Status: DC

## 2020-07-01 MED ORDER — SODIUM CHLORIDE 0.9 % WEIGHT BASED INFUSION
3.0000 mL/kg/h | INTRAVENOUS | Status: AC
Start: 2020-07-01 — End: 2020-07-01
  Administered 2020-07-01: 3 mL/kg/h via INTRAVENOUS

## 2020-07-01 MED ORDER — HEPARIN (PORCINE) IN NACL 1000-0.9 UT/500ML-% IV SOLN
INTRAVENOUS | Status: DC | PRN
  Administered 2020-07-01: 500 mL

## 2020-07-01 MED ORDER — SODIUM CHLORIDE 0.9% FLUSH: 3.0000 mL | Freq: Two times a day (BID) | INTRAVENOUS | Status: DC

## 2020-07-01 MED ORDER — LIDOCAINE HCL (PF) 1 % IJ SOLN
INTRAMUSCULAR | Status: AC
  Filled 2020-07-01: qty 30

## 2020-07-01 MED ORDER — SODIUM CHLORIDE 0.9 % IV SOLN: 250.0000 mL | INTRAVENOUS | Status: DC | PRN

## 2020-07-01 MED ORDER — HEPARIN SODIUM (PORCINE) 1000 UNIT/ML IJ SOLN
INTRAMUSCULAR | Status: DC | PRN
  Administered 2020-07-01: 4000 [IU] via INTRAVENOUS

## 2020-07-01 MED ORDER — MIDAZOLAM HCL 2 MG/2ML IJ SOLN
INTRAMUSCULAR | Status: AC
  Filled 2020-07-01: qty 2

## 2020-07-01 MED ORDER — ASPIRIN EC 81 MG PO TBEC: 81.0000 mg | DELAYED_RELEASE_TABLET | Freq: Every day | ORAL | Status: DC

## 2020-07-01 MED ORDER — LABETALOL HCL 5 MG/ML IV SOLN: 10.0000 mg | INTRAVENOUS | Status: DC | PRN

## 2020-07-01 MED ORDER — HYDRALAZINE HCL 20 MG/ML IJ SOLN: 10.0000 mg | INTRAMUSCULAR | Status: DC | PRN

## 2020-07-01 MED ORDER — FENTANYL CITRATE (PF) 100 MCG/2ML IJ SOLN
INTRAMUSCULAR | Status: DC | PRN
  Administered 2020-07-01: 25 ug via INTRAVENOUS

## 2020-07-01 MED ORDER — ISOSORBIDE MONONITRATE ER 30 MG PO TB24: 15.0000 mg | ORAL_TABLET | Freq: Every day | ORAL | 5 refills | Status: DC

## 2020-07-01 MED ORDER — FENTANYL CITRATE (PF) 100 MCG/2ML IJ SOLN
INTRAMUSCULAR | Status: AC
  Filled 2020-07-01: qty 2

## 2020-07-01 MED ORDER — VERAPAMIL HCL 2.5 MG/ML IV SOLN
INTRAVENOUS | Status: AC
  Filled 2020-07-01: qty 2

## 2020-07-01 MED ORDER — ASPIRIN 81 MG PO CHEW: 81.0000 mg | CHEWABLE_TABLET | ORAL | Status: AC

## 2020-07-01 MED ORDER — HEPARIN SODIUM (PORCINE) 1000 UNIT/ML IJ SOLN
INTRAMUSCULAR | Status: AC
  Filled 2020-07-01: qty 1

## 2020-07-01 MED ORDER — VERAPAMIL HCL 2.5 MG/ML IV SOLN: INTRAVENOUS | Status: DC | PRN

## 2020-07-01 SURGICAL SUPPLY — 9 items
CATH 5FR JL3.5 JR4 ANG PIG MP (CATHETERS) ×2 IMPLANT
DEVICE RAD COMP TR BAND LRG (VASCULAR PRODUCTS) ×2 IMPLANT
GUIDEWIRE INQWIRE 1.5J.035X260 (WIRE) ×1 IMPLANT
INQWIRE 1.5J .035X260CM (WIRE) ×2
KIT HEART LEFT (KITS) ×2 IMPLANT
PACK CARDIAC CATHETERIZATION (CUSTOM PROCEDURE TRAY) ×2 IMPLANT
SYR MEDRAD MARK 7 150ML (SYRINGE) ×2 IMPLANT
TRANSDUCER W/STOPCOCK (MISCELLANEOUS) ×2 IMPLANT
TUBING CIL FLEX 10 FLL-RA (TUBING) ×2 IMPLANT

## 2020-07-01 NOTE — Discharge Instructions (Signed)
Radial Site Care  This sheet gives you information about how to care for yourself after your procedure. Your health care provider may also give you more specific instructions. If you have problems or questions, contact your health care provider. What can I expect after the procedure? After the procedure, it is common to have:  Bruising and tenderness at the catheter insertion area. Follow these instructions at home: Medicines  Take over-the-counter and prescription medicines only as told by your health care provider. Insertion site care  Follow instructions from your health care provider about how to take care of your insertion site. Make sure you: ? Wash your hands with soap and water before you change your bandage (dressing). If soap and water are not available, use hand sanitizer. ? Change your dressing as told by your health care provider. ? Leave stitches (sutures), skin glue, or adhesive strips in place. These skin closures may need to stay in place for 2 weeks or longer. If adhesive strip edges start to loosen and curl up, you may trim the loose edges. Do not remove adhesive strips completely unless your health care provider tells you to do that.  Check your insertion site every day for signs of infection. Check for: ? Redness, swelling, or pain. ? Fluid or blood. ? Pus or a bad smell. ? Warmth.  Do not take baths, swim, or use a hot tub until your health care provider approves.  You may shower 24-48 hours after the procedure, or as directed by your health care provider. ? Remove the dressing and gently wash the site with plain soap and water. ? Pat the area dry with a clean towel. ? Do not rub the site. That could cause bleeding.  Do not apply powder or lotion to the site. Activity  For 24 hours after the procedure, or as directed by your health care provider: ? Do not flex or bend the affected arm. ? Do not push or pull heavy objects with the affected arm. ? Do not drive  yourself home from the hospital or clinic. You may drive 24 hours after the procedure unless your health care provider tells you not to. ? Do not operate machinery or power tools.  Do not lift anything that is heavier than 10 lb (4.5 kg), or the limit that you are told, until your health care provider says that it is safe.  Ask your health care provider when it is okay to: ? Return to work or school. ? Resume usual physical activities or sports. ? Resume sexual activity.   General instructions  If the catheter site starts to bleed, raise your arm and put firm pressure on the site. If the bleeding does not stop, get help right away. This is a medical emergency.  If you went home on the same day as your procedure, a responsible adult should be with you for the first 24 hours after you arrive home.  Keep all follow-up visits as told by your health care provider. This is important. Contact a health care provider if:  You have a fever.  You have redness, swelling, or yellow drainage around your insertion site. Get help right away if:  You have unusual pain at the radial site.  The catheter insertion area swells very fast.  The insertion area is bleeding, and the bleeding does not stop when you hold steady pressure on the area.  Your arm or hand becomes pale, cool, tingly, or numb. These symptoms may represent a serious   problem that is an emergency. Do not wait to see if the symptoms will go away. Get medical help right away. Call your local emergency services (911 in the [place] Do not drive yourself to the hospital. Summary  After the procedure, it is common to have bruising and tenderness at the site.  Follow instructions from your health care provider about how to take care of your radial site wound. Check the wound every day for signs of infection.  Do not lift anything that is heavier than 10 lb (4.5 kg), or the limit that you are told, until your health care provider says that it  is safe. This information is not intended to replace advice given to you by your health care provider. Make sure you discuss any questions you have with your health care provider. Document Revised: 05/05/2017 Document Reviewed: 05/05/2017 Elsevier Patient Education  2021 [employer]vier Inc.  

## 2020-07-01 NOTE — Interval H&P Note (Signed)
History and Physical Interval Note:  07/01/2020 7:15 AM  Vanessa [name]  has presented today for surgery, with the diagnosis of chest pain and abnormal cardiac CTA.  The various methods of treatment have been discussed with the patient and family. After consideration of risks, benefits and other options for treatment, the patient has consented to  Procedure(s): LEFT HEART CATH AND CORONARY ANGIOGRAPHY (N/A) as a surgical intervention.  The patient's history has been reviewed, patient examined, no change in status, stable for surgery.  I have reviewed the patient's chart and labs.  Questions were answered to the patient's satisfaction.    Cath Lab Visit (complete for each Cath Lab visit)  Clinical Evaluation Leading to the Procedure:   ACS: No.  Non-ACS:    Anginal Classification: CCS III  Anti-ischemic medical therapy: Minimal Therapy (1 class of medications)  Non-Invasive Test Results: High-risk stress test findings: cardiac mortality >3%/year (Cardiac CTA with hemodynamically significant three-vessel CAD by CT FFR)  Prior CABG: No previous CABG  Janalynn Eder

## 2020-07-02 MED FILL — Lidocaine HCl Local Preservative Free (PF) Inj 1%: INTRAMUSCULAR | Qty: 30 | Status: AC

## 2020-07-04 ENCOUNTER — Other Ambulatory Visit: Payer: Self-pay

## 2020-07-04 ENCOUNTER — Encounter: Payer: Self-pay | Admitting: *Deleted

## 2020-07-04 ENCOUNTER — Encounter: Payer: Self-pay | Admitting: Thoracic Surgery (Cardiothoracic Vascular Surgery)

## 2020-07-04 ENCOUNTER — Other Ambulatory Visit: Payer: Self-pay | Admitting: *Deleted

## 2020-07-04 ENCOUNTER — Institutional Professional Consult (permissible substitution): Payer: PPO | Admitting: Thoracic Surgery (Cardiothoracic Vascular Surgery)

## 2020-07-04 DIAGNOSIS — I2510 Atherosclerotic heart disease of native coronary artery without angina pectoris: Secondary | ICD-10-CM | POA: Insufficient documentation

## 2020-07-04 DIAGNOSIS — I25119 Atherosclerotic heart disease of native coronary artery with unspecified angina pectoris: Secondary | ICD-10-CM | POA: Diagnosis not present

## 2020-07-04 HISTORY — DX: Atherosclerotic heart disease of native coronary artery without angina pectoris: I25.10

## 2020-07-04 NOTE — Patient Instructions (Signed)
Stop Eliquis 2 days prior to surgery

## 2020-07-04 NOTE — Progress Notes (Signed)
PCP is Myrlene Broker, MD Referring Provider is End, Harrell Gave, MD  Chief Complaint  Patient presents with  . Coronary Artery Disease    New patient consultation, CATH 07/01/20    HPI:  Vanessa [name] is sent for consultation regarding three-vessel coronary disease.  Vanessa [name] is a 70 year old woman with a history of hypertension, hyperlipidemia, paroxysmal atrial fibrillation, arthritis, glaucoma, anxiety, and depression.  Her cardiac history dates back to 2013 when she was having palpitations and back neck and jaw discomfort.  A stress Myoview was negative.  She was found to have paroxysmal atrial fibrillation.  She has been on Eliquis since then.  She was on flecainide for a while but that was stopped in 2019.  In 2020 a Linq monitor showed about 1% atrial fibrillation.  Most of her symptoms seem to be related to frequent PACs.  She recently had a follow-up appointment with Dr. Curt Bears.  She complains of some chest discomfort which is a sensation under the left side of her breast that sometimes goes through to her back.  She is also had some episodes where she had pain in her neck and jaw.  She has had a couple of episodes where she had diaphoresis associated with this pain.  A cardiac CT was done which showed multivessel disease.  She then then underwent cardiac catheterization which showed severe three-vessel coronary disease with normal left ventricular function.   Past Medical History:  Diagnosis Date  . Anxiety   . AR (allergic rhinitis)   . Arthritis    knees  . Atrial fibrillation ([place])   . Cataract    bilateral /removed  . Depressed   . Glaucoma   . HTN (hypertension)   . Hyperlipidemia   . Overweight(278.02)     Past Surgical History:  Procedure Laterality Date  . ABDOMINAL HYSTERECTOMY    . CATARACT EXTRACTION    . catherization  11/00   normal  . COLONOSCOPY    . cyrotherapy    . EP IMPLANTABLE DEVICE N/A 03/02/2016   Procedure: Loop Recorder Insertion;   Surgeon: Will [name], MD;  Location: [facility];  Service: Cardiovascular;  Laterality: N/A;  . EYE SURGERY     x2  . GLAUCOMA SURGERY     bilateral  . KNEE ARTHROSCOPY     left knee  . LEFT HEART CATH AND CORONARY ANGIOGRAPHY N/A 07/01/2020   Procedure: LEFT HEART CATH AND CORONARY ANGIOGRAPHY;  Surgeon: Nelva Bush, MD;  Location: [facility];  Service: Cardiovascular;  Laterality: N/A;  . ROTATOR CUFF REPAIR     right shoulder  . WISDOM TOOTH EXTRACTION      Family History  Problem Relation Age of Onset  . Stroke Mother   . Heart attack Father   . Colon cancer Neg Hx   . Colon polyps Neg Hx   . Esophageal cancer Neg Hx   . Rectal cancer Neg Hx   . Stomach cancer Neg Hx     Social History Social History   Tobacco Use  . Smoking status: Never Smoker  . Smokeless tobacco: Never Used  Substance Use Topics  . Alcohol use: No  . Drug use: No    Current Outpatient Medications  Medication Sig Dispense Refill  . apixaban (ELIQUIS) 5 MG TABS tablet TAKE 1 TABLET BY MOUTH TWICE(2) DAILY (Patient taking differently: Take 5 mg by mouth 2 (two) times daily.) 180 tablet 3  . Ascorbic Acid (VITAMIN C) 1000 MG tablet  Take 1,000 mg by mouth daily.    Marland Kitchen aspirin EC 81 MG tablet Take 1 tablet (81 mg total) by mouth daily. Swallow whole.    . Calcium Carbonate-Vitamin D (CALTRATE 600+D PO) Take 600 mg by mouth daily.    . Cholecalciferol (VITAMIN D3) 50 MCG (2000 UT) TABS Take 2,000 Units by mouth daily.    Marland Kitchen diltiazem (CARDIZEM CD) 180 MG 24 hr capsule Take 1 capsule (180 mg total) by mouth daily. 90 capsule 3  . esomeprazole (NEXIUM) 40 MG capsule Take 1 capsule (40 mg total) by mouth daily. 30 capsule 6  . isosorbide mononitrate (IMDUR) 30 MG 24 hr tablet Take 0.5 tablets (15 mg total) by mouth daily. 30 tablet 5  . Multiple Vitamins-Minerals (ALIVE ONCE DAILY WOMENS 50+) TABS Take 1 tablet by mouth daily.    . nitroGLYCERIN (NITROSTAT) 0.4 MG SL tablet Place  1 tablet (0.4 mg total) under the tongue every 5 (five) minutes as needed for chest pain. 25 tablet 3  . Polyethylene Glycol 400 (BLINK TEARS) 0.25 % SOLN Place 1 drop into both eyes daily as needed (Dry eyes).    . rosuvastatin (CRESTOR) 10 MG tablet Take 1 tablet (10 mg total) by mouth daily. 90 tablet 3  . valsartan-hydrochlorothiazide (DIOVAN-HCT) 160-12.5 MG tablet Take 1 tablet by mouth daily. 90 tablet 3  . Zinc 50 MG CAPS Take 50 mg by mouth daily.      No current facility-administered medications for this visit.    Allergies  Allergen Reactions  . Codeine Nausea Only    nasuea    Review of Systems  Constitutional: Positive for activity change and fatigue. Negative for unexpected weight change.  HENT: Positive for hearing loss.   Eyes: Negative for visual disturbance.  Respiratory: Negative for shortness of breath.   Cardiovascular: Positive for chest pain and palpitations. Negative for leg swelling.  Gastrointestinal: Positive for abdominal pain (Frequent heartburn). Negative for abdominal distention.  Genitourinary: Negative for difficulty urinating and dysuria.  Musculoskeletal: Positive for arthralgias, joint swelling and myalgias (Leg cramps).  Neurological: Positive for dizziness (None recently). Negative for syncope and weakness.  Hematological: Negative for adenopathy. Bruises/bleeds easily (Eliquis).  All other systems reviewed and are negative.   BP (!) 167/79 (BP Location: Right Arm, Patient Position: Sitting, Cuff Size: Normal)   Pulse 68   Temp 97.7 F (36.5 C) (Skin)   Resp 20   Ht 5\' 8"  (1.727 m)   Wt 184 lb (83.5 kg)   SpO2 97% Comment: RA  BMI 27.98 kg/m  Physical Exam Vitals reviewed.  Constitutional:      General: She is not in acute distress.    Appearance: Normal appearance.  HENT:     Head: Normocephalic and atraumatic.  Eyes:     General: No scleral icterus.    Extraocular Movements: Extraocular movements intact.  Neck:     Vascular: No  carotid bruit.  Cardiovascular:     Rate and Rhythm: Normal rate and regular rhythm.     Heart sounds: Normal heart sounds. No murmur heard. No friction rub. No gallop.   Pulmonary:     Effort: Pulmonary effort is normal. No respiratory distress.     Breath sounds: Normal breath sounds. No wheezing or rales.  Abdominal:     General: There is no distension.     Palpations: Abdomen is soft.  Musculoskeletal:     Cervical back: Neck supple.     Right lower leg: No edema.  Left lower leg: No edema.  Lymphadenopathy:     Cervical: No cervical adenopathy.  Skin:    General: Skin is warm and dry.  Neurological:     General: No focal deficit present.     Mental Status: She is alert and oriented to person, place, and time.     Cranial Nerves: No cranial nerve deficit.     Motor: No weakness.   Palpable PT and DP bilaterally  Diagnostic Tests: Cardiac catheterization Conclusions: 1. Severe three-vessel coronary artery disease, including 80-90% proximal/mid LAD stenosis, 80% proximal D1 lesion, 95% proximal ramus stenosis, 70% large OM1 stenosis, and 95% disease involving small, codominant RCA. 2. Normal left ventricular contraction and filling pressure.  Recommendations: 1. Outpatient cardiac surgery consultation for CABG.  Add isosorbide mononitrate 15 mg daily for antianginal therapy. 2. Aggressive secondary prevention of coronary artery disease; consider escalation of statin therapy as tolerated. 3. If no evidence of bleeding or vascular injury at catheterization site, apixaban 5 mg twice daily should be restarted tomorrow morning.  Add aspirin 81 mg daily pending surgical consultation.  Nelva Bush, MD [facility] I personally reviewed the catheterization images and concur with the findings noted above.  Severe three-vessel coronary disease.  Impression: Vanessa [name] is a 70 year old woman with a history of hypertension, hyperlipidemia, paroxysmal atrial fibrillation,  arthritis, glaucoma, anxiety, and depression.  She presents with chest pain which has both some typical and atypical features.  That led to a cardiac CT which showed evidence of significant three-vessel disease.  At catheterization she was found to have severe three-vessel disease with an 80-90 proximal LAD stenosis, 80% diagonal lesion, 95% stenosis in the ramus intermedius, 70% stenosis in the large obtuse marginal 1 and 95% stenosis and a smaller codominant RCA.  She has preserved left-ventricular function.  Coronary artery bypass grafting is indicated for survival benefit and relief of symptoms.  I also talked with Dr. Curt Bears.  We both feel that she would benefit from a left atrial appendage clip.  We will plan to place at time of surgery as well.  I described the proposed operation of coronary artery bypass grafting and left atrial appendage clip placement to Mrs. Ficek.  I informed her of the indications, risks, benefits, and alternatives.  She understands the risks include, but are not limited to death, MI, DVT, PE, bleeding, possible need for transfusion, infection, cardiac arrhythmias, respiratory or renal failure, as well as possibility of other unforeseeable complications.  She understands accepts the risks and agrees to proceed.  She will need to hold Eliquis for 2 days prior to surgery  Plan: Coronary artery bypass grafting and left atrial appendage clip placement on Wednesday, 07/10/2020  I spent over 45 minutes in review of records, images, and in consultation with Mrs. Pata today. Melrose Nakayama, MD [facility] [phone]

## 2020-07-04 NOTE — H&P (View-Only) (Signed)
PCP is Myrlene Broker, MD Referring Provider is End, Harrell Gave, MD  Chief Complaint  Patient presents with  . Coronary Artery Disease    New patient consultation, CATH 07/01/20    HPI:  Vanessa [name] is sent for consultation regarding three-vessel coronary disease.  Vanessa [name] is a 70 year old woman with a history of hypertension, hyperlipidemia, paroxysmal atrial fibrillation, arthritis, glaucoma, anxiety, and depression.  Her cardiac history dates back to 2013 when she was having palpitations and back neck and jaw discomfort.  A stress Myoview was negative.  She was found to have paroxysmal atrial fibrillation.  She has been on Eliquis since then.  She was on flecainide for a while but that was stopped in 2019.  In 2020 a Linq monitor showed about 1% atrial fibrillation.  Most of her symptoms seem to be related to frequent PACs.  She recently had a follow-up appointment with Dr. Curt Bears.  She complains of some chest discomfort which is a sensation under the left side of her breast that sometimes goes through to her back.  She is also had some episodes where she had pain in her neck and jaw.  She has had a couple of episodes where she had diaphoresis associated with this pain.  A cardiac CT was done which showed multivessel disease.  She then then underwent cardiac catheterization which showed severe three-vessel coronary disease with normal left ventricular function.   Past Medical History:  Diagnosis Date  . Anxiety   . AR (allergic rhinitis)   . Arthritis    knees  . Atrial fibrillation ([place])   . Cataract    bilateral /removed  . Depressed   . Glaucoma   . HTN (hypertension)   . Hyperlipidemia   . Overweight(278.02)     Past Surgical History:  Procedure Laterality Date  . ABDOMINAL HYSTERECTOMY    . CATARACT EXTRACTION    . catherization  11/00   normal  . COLONOSCOPY    . cyrotherapy    . EP IMPLANTABLE DEVICE N/A 03/02/2016   Procedure: Loop Recorder Insertion;   Surgeon: Will [name], MD;  Location: [facility];  Service: Cardiovascular;  Laterality: N/A;  . EYE SURGERY     x2  . GLAUCOMA SURGERY     bilateral  . KNEE ARTHROSCOPY     left knee  . LEFT HEART CATH AND CORONARY ANGIOGRAPHY N/A 07/01/2020   Procedure: LEFT HEART CATH AND CORONARY ANGIOGRAPHY;  Surgeon: Nelva Bush, MD;  Location: [facility];  Service: Cardiovascular;  Laterality: N/A;  . ROTATOR CUFF REPAIR     right shoulder  . WISDOM TOOTH EXTRACTION      Family History  Problem Relation Age of Onset  . Stroke Mother   . Heart attack Father   . Colon cancer Neg Hx   . Colon polyps Neg Hx   . Esophageal cancer Neg Hx   . Rectal cancer Neg Hx   . Stomach cancer Neg Hx     Social History Social History   Tobacco Use  . Smoking status: Never Smoker  . Smokeless tobacco: Never Used  Substance Use Topics  . Alcohol use: No  . Drug use: No    Current Outpatient Medications  Medication Sig Dispense Refill  . apixaban (ELIQUIS) 5 MG TABS tablet TAKE 1 TABLET BY MOUTH TWICE(2) DAILY (Patient taking differently: Take 5 mg by mouth 2 (two) times daily.) 180 tablet 3  . Ascorbic Acid (VITAMIN C) 1000 MG tablet  Take 1,000 mg by mouth daily.    Marland Kitchen aspirin EC 81 MG tablet Take 1 tablet (81 mg total) by mouth daily. Swallow whole.    . Calcium Carbonate-Vitamin D (CALTRATE 600+D PO) Take 600 mg by mouth daily.    . Cholecalciferol (VITAMIN D3) 50 MCG (2000 UT) TABS Take 2,000 Units by mouth daily.    Marland Kitchen diltiazem (CARDIZEM CD) 180 MG 24 hr capsule Take 1 capsule (180 mg total) by mouth daily. 90 capsule 3  . esomeprazole (NEXIUM) 40 MG capsule Take 1 capsule (40 mg total) by mouth daily. 30 capsule 6  . isosorbide mononitrate (IMDUR) 30 MG 24 hr tablet Take 0.5 tablets (15 mg total) by mouth daily. 30 tablet 5  . Multiple Vitamins-Minerals (ALIVE ONCE DAILY WOMENS 50+) TABS Take 1 tablet by mouth daily.    . nitroGLYCERIN (NITROSTAT) 0.4 MG SL tablet Place  1 tablet (0.4 mg total) under the tongue every 5 (five) minutes as needed for chest pain. 25 tablet 3  . Polyethylene Glycol 400 (BLINK TEARS) 0.25 % SOLN Place 1 drop into both eyes daily as needed (Dry eyes).    . rosuvastatin (CRESTOR) 10 MG tablet Take 1 tablet (10 mg total) by mouth daily. 90 tablet 3  . valsartan-hydrochlorothiazide (DIOVAN-HCT) 160-12.5 MG tablet Take 1 tablet by mouth daily. 90 tablet 3  . Zinc 50 MG CAPS Take 50 mg by mouth daily.      No current facility-administered medications for this visit.    Allergies  Allergen Reactions  . Codeine Nausea Only    nasuea    Review of Systems  Constitutional: Positive for activity change and fatigue. Negative for unexpected weight change.  HENT: Positive for hearing loss.   Eyes: Negative for visual disturbance.  Respiratory: Negative for shortness of breath.   Cardiovascular: Positive for chest pain and palpitations. Negative for leg swelling.  Gastrointestinal: Positive for abdominal pain (Frequent heartburn). Negative for abdominal distention.  Genitourinary: Negative for difficulty urinating and dysuria.  Musculoskeletal: Positive for arthralgias, joint swelling and myalgias (Leg cramps).  Neurological: Positive for dizziness (None recently). Negative for syncope and weakness.  Hematological: Negative for adenopathy. Bruises/bleeds easily (Eliquis).  All other systems reviewed and are negative.   BP (!) 167/79 (BP Location: Right Arm, Patient Position: Sitting, Cuff Size: Normal)   Pulse 68   Temp 97.7 F (36.5 C) (Skin)   Resp 20   Ht 5\' 8"  (1.727 m)   Wt 184 lb (83.5 kg)   SpO2 97% Comment: RA  BMI 27.98 kg/m  Physical Exam Vitals reviewed.  Constitutional:      General: She is not in acute distress.    Appearance: Normal appearance.  HENT:     Head: Normocephalic and atraumatic.  Eyes:     General: No scleral icterus.    Extraocular Movements: Extraocular movements intact.  Neck:     Vascular: No  carotid bruit.  Cardiovascular:     Rate and Rhythm: Normal rate and regular rhythm.     Heart sounds: Normal heart sounds. No murmur heard. No friction rub. No gallop.   Pulmonary:     Effort: Pulmonary effort is normal. No respiratory distress.     Breath sounds: Normal breath sounds. No wheezing or rales.  Abdominal:     General: There is no distension.     Palpations: Abdomen is soft.  Musculoskeletal:     Cervical back: Neck supple.     Right lower leg: No edema.  Left lower leg: No edema.  Lymphadenopathy:     Cervical: No cervical adenopathy.  Skin:    General: Skin is warm and dry.  Neurological:     General: No focal deficit present.     Mental Status: She is alert and oriented to person, place, and time.     Cranial Nerves: No cranial nerve deficit.     Motor: No weakness.   Palpable PT and DP bilaterally  Diagnostic Tests: Cardiac catheterization Conclusions: 1. Severe three-vessel coronary artery disease, including 80-90% proximal/mid LAD stenosis, 80% proximal D1 lesion, 95% proximal ramus stenosis, 70% large OM1 stenosis, and 95% disease involving small, codominant RCA. 2. Normal left ventricular contraction and filling pressure.  Recommendations: 1. Outpatient cardiac surgery consultation for CABG.  Add isosorbide mononitrate 15 mg daily for antianginal therapy. 2. Aggressive secondary prevention of coronary artery disease; consider escalation of statin therapy as tolerated. 3. If no evidence of bleeding or vascular injury at catheterization site, apixaban 5 mg twice daily should be restarted tomorrow morning.  Add aspirin 81 mg daily pending surgical consultation.  Nelva Bush, MD [facility] I personally reviewed the catheterization images and concur with the findings noted above.  Severe three-vessel coronary disease.  Impression: Vanessa [name] is a 70 year old woman with a history of hypertension, hyperlipidemia, paroxysmal atrial fibrillation,  arthritis, glaucoma, anxiety, and depression.  She presents with chest pain which has both some typical and atypical features.  That led to a cardiac CT which showed evidence of significant three-vessel disease.  At catheterization she was found to have severe three-vessel disease with an 80-90 proximal LAD stenosis, 80% diagonal lesion, 95% stenosis in the ramus intermedius, 70% stenosis in the large obtuse marginal 1 and 95% stenosis and a smaller codominant RCA.  She has preserved left-ventricular function.  Coronary artery bypass grafting is indicated for survival benefit and relief of symptoms.  I also talked with Dr. Curt Bears.  We both feel that she would benefit from a left atrial appendage clip.  We will plan to place at time of surgery as well.  I described the proposed operation of coronary artery bypass grafting and left atrial appendage clip placement to Vanessa [name].  I informed her of the indications, risks, benefits, and alternatives.  She understands the risks include, but are not limited to death, MI, DVT, PE, bleeding, possible need for transfusion, infection, cardiac arrhythmias, respiratory or renal failure, as well as possibility of other unforeseeable complications.  She understands accepts the risks and agrees to proceed.  She will need to hold Eliquis for 2 days prior to surgery  Plan: Coronary artery bypass grafting and left atrial appendage clip placement on Wednesday, 07/10/2020  I spent over 45 minutes in review of records, images, and in consultation with Vanessa [name] today. Melrose Nakayama, MD [facility] [phone]

## 2020-07-06 DIAGNOSIS — B372 Candidiasis of skin and nail: Secondary | ICD-10-CM | POA: Diagnosis not present

## 2020-07-06 DIAGNOSIS — R300 Dysuria: Secondary | ICD-10-CM | POA: Diagnosis not present

## 2020-07-08 NOTE — Progress Notes (Signed)
Surgical Instructions    Your procedure is scheduled on 07/10/20.  Report to [facility] Main Entrance "A" at 05:30 A.M., then check in with the Admitting office.  Call this number if you have problems the morning of surgery:  [phone]   If you have any questions prior to your surgery date call [phone]: Open Monday-Friday 8am-4pm    Remember:  Do not eat or drink after midnight the night before your surgery     Take these medicines the morning of surgery with A SIP OF WATER  diltiazem (CARDIZEM CD) esomeprazole (NEXIUM) isosorbide mononitrate (IMDUR) rosuvastatin (CRESTOR)     As of today, STOP taking any Aspirin (unless otherwise instructed by your surgeon) Aleve, Naproxen, Ibuprofen, Motrin, Advil, Goody's, BC's, all herbal medications, fish oil, and all vitamins. Please stop Eliquis 2 days prior to surgery. Your last dose of eliquis will be on 07/07/20.                     Do not wear jewelry, make up, or nail polish            Do not wear lotions, powders, perfumes/colognes, or deodorant.            Do not shave 48 hours prior to surgery.              Do not bring valuables to the hospital.            [facility] is not responsible for any belongings or valuables.  Do NOT Smoke (Tobacco/Vaping) or drink Alcohol 24 hours prior to your procedure If you use a CPAP at night, you may bring all equipment for your overnight stay.   Contacts, glasses, dentures or bridgework may not be worn into surgery, please bring cases for these belongings   For patients admitted to the hospital, discharge time will be determined by your treatment team.   Patients discharged the day of surgery will not be allowed to drive home, and someone needs to stay with them for 24 hours.    Special instructions:   [place]- Preparing For Surgery  Before surgery, you can play an important role. Because skin is not sterile, your skin needs to be as free of germs as possible. You can reduce the  number of germs on your skin by washing with CHG (chlorahexidine gluconate) Soap before surgery.  CHG is an antiseptic cleaner which kills germs and bonds with the skin to continue killing germs even after washing.    Oral Hygiene is also important to reduce your risk of infection.  Remember - BRUSH YOUR TEETH THE MORNING OF SURGERY WITH YOUR REGULAR TOOTHPASTE  Please do not use if you have an allergy to CHG or antibacterial soaps. If your skin becomes reddened/irritated stop using the CHG.  Do not shave (including legs and underarms) for at least 48 hours prior to first CHG shower. It is OK to shave your face.  Please follow these instructions carefully.   1. Shower the NIGHT BEFORE SURGERY and the MORNING OF SURGERY  2. If you chose to wash your hair, wash your hair first as usual with your normal shampoo.  3. After you shampoo, rinse your hair and body thoroughly to remove the shampoo.  4. Wash Face and genitals (private parts) with your normal soap.   5.  Shower the NIGHT BEFORE SURGERY and the MORNING OF SURGERY with CHG Soap.   6. Use CHG Soap as you would any other  liquid soap. You can apply CHG directly to the skin and wash gently with a scrungie or a clean washcloth.   7. Apply the CHG Soap to your body ONLY FROM THE NECK DOWN.  Do not use on open wounds or open sores. Avoid contact with your eyes, ears, mouth and genitals (private parts). Wash Face and genitals (private parts)  with your normal soap.   8. Wash thoroughly, paying special attention to the area where your surgery will be performed.  9. Thoroughly rinse your body with warm water from the neck down.  10. DO NOT shower/wash with your normal soap after using and rinsing off the CHG Soap.  11. Pat yourself dry with a CLEAN TOWEL.  12. Wear CLEAN PAJAMAS to bed the night before surgery  13. Place CLEAN SHEETS on your bed the night before your surgery  14. DO NOT SLEEP WITH PETS.   Day of Surgery: Take a  shower.  Wear Clean/Comfortable clothing the morning of surgery Do not apply any deodorants/lotions.   Remember to brush your teeth WITH YOUR REGULAR TOOTHPASTE.   Please read over the following fact sheets that you were given.

## 2020-07-09 ENCOUNTER — Other Ambulatory Visit: Payer: Self-pay

## 2020-07-09 ENCOUNTER — Other Ambulatory Visit (HOSPITAL_COMMUNITY)
Admission: RE | Admit: 2020-07-09 | Discharge: 2020-07-09 | Disposition: A | Discharge status: Home / Self Care | Payer: PPO | Source: Ambulatory Visit | Attending: Thoracic Surgery (Cardiothoracic Vascular Surgery) | Admitting: Thoracic Surgery (Cardiothoracic Vascular Surgery)

## 2020-07-09 ENCOUNTER — Ambulatory Visit (HOSPITAL_BASED_OUTPATIENT_CLINIC_OR_DEPARTMENT_OTHER)
Admission: RE | Admit: 2020-07-09 | Discharge: 2020-07-09 | Disposition: A | Discharge status: Home / Self Care | Payer: PPO | Source: Ambulatory Visit | Attending: Thoracic Surgery (Cardiothoracic Vascular Surgery) | Admitting: Thoracic Surgery (Cardiothoracic Vascular Surgery)

## 2020-07-09 ENCOUNTER — Encounter (HOSPITAL_COMMUNITY)
Admission: RE | Admit: 2020-07-09 | Discharge: 2020-07-09 | Disposition: A | Discharge status: Home / Self Care | Payer: PPO | Source: Ambulatory Visit | Attending: Thoracic Surgery (Cardiothoracic Vascular Surgery) | Admitting: Thoracic Surgery (Cardiothoracic Vascular Surgery)

## 2020-07-09 ENCOUNTER — Encounter (HOSPITAL_COMMUNITY): Payer: Self-pay

## 2020-07-09 ENCOUNTER — Ambulatory Visit (HOSPITAL_COMMUNITY)
Admission: RE | Admit: 2020-07-09 | Discharge: 2020-07-09 | Disposition: A | Discharge status: Home / Self Care | Payer: PPO | Source: Ambulatory Visit | Attending: Thoracic Surgery (Cardiothoracic Vascular Surgery) | Admitting: Thoracic Surgery (Cardiothoracic Vascular Surgery)

## 2020-07-09 DIAGNOSIS — Z885 Allergy status to narcotic agent status: Secondary | ICD-10-CM | POA: Diagnosis not present

## 2020-07-09 DIAGNOSIS — Z7901 Long term (current) use of anticoagulants: Secondary | ICD-10-CM | POA: Diagnosis not present

## 2020-07-09 DIAGNOSIS — E785 Hyperlipidemia, unspecified: Secondary | ICD-10-CM | POA: Insufficient documentation

## 2020-07-09 DIAGNOSIS — F419 Anxiety disorder, unspecified: Secondary | ICD-10-CM | POA: Diagnosis present

## 2020-07-09 DIAGNOSIS — I2510 Atherosclerotic heart disease of native coronary artery without angina pectoris: Secondary | ICD-10-CM

## 2020-07-09 DIAGNOSIS — I10 Essential (primary) hypertension: Secondary | ICD-10-CM | POA: Diagnosis present

## 2020-07-09 DIAGNOSIS — Z90710 Acquired absence of both cervix and uterus: Secondary | ICD-10-CM | POA: Diagnosis not present

## 2020-07-09 DIAGNOSIS — I25810 Atherosclerosis of coronary artery bypass graft(s) without angina pectoris: Secondary | ICD-10-CM | POA: Diagnosis not present

## 2020-07-09 DIAGNOSIS — J811 Chronic pulmonary edema: Secondary | ICD-10-CM | POA: Diagnosis not present

## 2020-07-09 DIAGNOSIS — Z951 Presence of aortocoronary bypass graft: Secondary | ICD-10-CM | POA: Diagnosis not present

## 2020-07-09 DIAGNOSIS — Z7982 Long term (current) use of aspirin: Secondary | ICD-10-CM | POA: Diagnosis not present

## 2020-07-09 DIAGNOSIS — F32A Depression, unspecified: Secondary | ICD-10-CM | POA: Diagnosis present

## 2020-07-09 DIAGNOSIS — J90 Pleural effusion, not elsewhere classified: Secondary | ICD-10-CM | POA: Diagnosis not present

## 2020-07-09 DIAGNOSIS — I517 Cardiomegaly: Secondary | ICD-10-CM | POA: Diagnosis not present

## 2020-07-09 DIAGNOSIS — Z20822 Contact with and (suspected) exposure to covid-19: Secondary | ICD-10-CM | POA: Insufficient documentation

## 2020-07-09 DIAGNOSIS — R001 Bradycardia, unspecified: Secondary | ICD-10-CM | POA: Diagnosis not present

## 2020-07-09 DIAGNOSIS — Z8249 Family history of ischemic heart disease and other diseases of the circulatory system: Secondary | ICD-10-CM | POA: Diagnosis not present

## 2020-07-09 DIAGNOSIS — Z79899 Other long term (current) drug therapy: Secondary | ICD-10-CM | POA: Diagnosis not present

## 2020-07-09 DIAGNOSIS — Z01818 Encounter for other preprocedural examination: Secondary | ICD-10-CM | POA: Insufficient documentation

## 2020-07-09 DIAGNOSIS — I081 Rheumatic disorders of both mitral and tricuspid valves: Secondary | ICD-10-CM | POA: Diagnosis not present

## 2020-07-09 DIAGNOSIS — H409 Unspecified glaucoma: Secondary | ICD-10-CM | POA: Diagnosis present

## 2020-07-09 DIAGNOSIS — J9383 Other pneumothorax: Secondary | ICD-10-CM | POA: Diagnosis not present

## 2020-07-09 DIAGNOSIS — E782 Mixed hyperlipidemia: Secondary | ICD-10-CM | POA: Diagnosis present

## 2020-07-09 DIAGNOSIS — I700 Atherosclerosis of aorta: Secondary | ICD-10-CM | POA: Diagnosis not present

## 2020-07-09 DIAGNOSIS — J939 Pneumothorax, unspecified: Secondary | ICD-10-CM | POA: Diagnosis not present

## 2020-07-09 DIAGNOSIS — J9811 Atelectasis: Secondary | ICD-10-CM | POA: Diagnosis not present

## 2020-07-09 DIAGNOSIS — E8770 Fluid overload, unspecified: Secondary | ICD-10-CM | POA: Diagnosis not present

## 2020-07-09 DIAGNOSIS — D696 Thrombocytopenia, unspecified: Secondary | ICD-10-CM | POA: Diagnosis present

## 2020-07-09 DIAGNOSIS — I480 Paroxysmal atrial fibrillation: Secondary | ICD-10-CM | POA: Diagnosis present

## 2020-07-09 DIAGNOSIS — D62 Acute posthemorrhagic anemia: Secondary | ICD-10-CM | POA: Diagnosis not present

## 2020-07-09 DIAGNOSIS — Z823 Family history of stroke: Secondary | ICD-10-CM | POA: Diagnosis not present

## 2020-07-09 HISTORY — DX: Gastro-esophageal reflux disease without esophagitis: K21.9

## 2020-07-09 HISTORY — DX: Cardiac arrhythmia, unspecified: I49.9

## 2020-07-09 HISTORY — DX: Atherosclerotic heart disease of native coronary artery without angina pectoris: I25.10

## 2020-07-09 LAB — BLOOD GAS, ARTERIAL
Acid-base deficit: 1 mmol/L (ref 0.0–2.0)
Bicarbonate: 23.1 mmol/L (ref 20.0–28.0)
Drawn by: 602861
FIO2: 21
O2 Saturation: 97.5 %
Patient temperature: 37
pCO2 arterial: 37.3 mmHg (ref 32.0–48.0)
pH, Arterial: 7.408 (ref 7.350–7.450)
pO2, Arterial: 98.5 mmHg (ref 83.0–108.0)

## 2020-07-09 LAB — COMPREHENSIVE METABOLIC PANEL
ALT: 18 U/L (ref 0–44)
AST: 25 U/L (ref 15–41)
Albumin: 4 g/dL (ref 3.5–5.0)
Alkaline Phosphatase: 56 U/L (ref 38–126)
Anion gap: 12 (ref 5–15)
BUN: 21 mg/dL (ref 8–23)
CO2: 21 mmol/L — ABNORMAL LOW (ref 22–32)
Calcium: 9.5 mg/dL (ref 8.9–10.3)
Chloride: 102 mmol/L (ref 98–111)
Creatinine, Ser: 1.1 mg/dL — ABNORMAL HIGH (ref 0.44–1.00)
GFR, Estimated: 54 mL/min — ABNORMAL LOW (ref >60)
Glucose, Bld: 112 mg/dL — ABNORMAL HIGH (ref 70–99)
Potassium: 3.9 mmol/L (ref 3.5–5.1)
Sodium: 135 mmol/L (ref 135–145)
Total Bilirubin: 0.7 mg/dL (ref 0.3–1.2)
Total Protein: 7 g/dL (ref 6.5–8.1)

## 2020-07-09 LAB — SURGICAL PCR SCREEN
MRSA, PCR: NEGATIVE
Staphylococcus aureus: NEGATIVE

## 2020-07-09 LAB — URINALYSIS, ROUTINE W REFLEX MICROSCOPIC
Bilirubin Urine: NEGATIVE
Glucose, UA: NEGATIVE mg/dL
Hgb urine dipstick: NEGATIVE
Ketones, ur: NEGATIVE mg/dL
Leukocytes,Ua: NEGATIVE
Nitrite: NEGATIVE
Protein, ur: NEGATIVE mg/dL
Specific Gravity, Urine: 1.008 (ref 1.005–1.030)
pH: 6 (ref 5.0–8.0)

## 2020-07-09 LAB — CBC
HCT: 36 % (ref 36.0–46.0)
Hemoglobin: 11.7 g/dL — ABNORMAL LOW (ref 12.0–15.0)
MCH: 30 pg (ref 26.0–34.0)
MCHC: 32.5 g/dL (ref 30.0–36.0)
MCV: 92.3 fL (ref 80.0–100.0)
Platelets: 216 10*3/uL (ref 150–400)
RBC: 3.9 MIL/uL (ref 3.87–5.11)
RDW: 13.7 % (ref 11.5–15.5)
WBC: 12.6 10*3/uL — ABNORMAL HIGH (ref 4.0–10.5)
nRBC: 0 % (ref 0.0–0.2)

## 2020-07-09 LAB — APTT: aPTT: 31 seconds (ref 24–36)

## 2020-07-09 LAB — PROTIME-INR
INR: 1.1 (ref 0.8–1.2)
Prothrombin Time: 13.4 seconds (ref 11.4–15.2)

## 2020-07-09 LAB — SARS CORONAVIRUS 2 (TAT 6-24 HRS): SARS Coronavirus 2: NEGATIVE

## 2020-07-09 LAB — HEMOGLOBIN A1C
Hgb A1c MFr Bld: 5.6 % (ref 4.8–5.6)
Mean Plasma Glucose: 114.02 mg/dL

## 2020-07-09 MED ORDER — SODIUM CHLORIDE 0.9 % IV SOLN
1.5000 g | INTRAVENOUS | Status: AC
  Administered 2020-07-10: 1.5 g via INTRAVENOUS
  Filled 2020-07-09: qty 1.5

## 2020-07-09 MED ORDER — MAGNESIUM SULFATE 50 % IJ SOLN
40.0000 meq | INTRAMUSCULAR | Status: DC
  Filled 2020-07-09: qty 9.85

## 2020-07-09 MED ORDER — SODIUM CHLORIDE 0.9 % IV SOLN
INTRAVENOUS | Status: DC
  Filled 2020-07-09: qty 30

## 2020-07-09 MED ORDER — DEXMEDETOMIDINE HCL IN NACL 400 MCG/100ML IV SOLN
0.1000 ug/kg/h | INTRAVENOUS | Status: AC
  Administered 2020-07-10: .2 ug/kg/h via INTRAVENOUS
  Filled 2020-07-09: qty 100

## 2020-07-09 MED ORDER — POTASSIUM CHLORIDE 2 MEQ/ML IV SOLN
80.0000 meq | INTRAVENOUS | Status: DC
  Filled 2020-07-09: qty 40

## 2020-07-09 MED ORDER — TRANEXAMIC ACID (OHS) BOLUS VIA INFUSION
15.0000 mg/kg | INTRAVENOUS | Status: AC
  Administered 2020-07-10: 1231.5 mg via INTRAVENOUS
  Filled 2020-07-09: qty 1232

## 2020-07-09 MED ORDER — MILRINONE LACTATE IN DEXTROSE 20-5 MG/100ML-% IV SOLN
0.3000 ug/kg/min | INTRAVENOUS | Status: DC
  Filled 2020-07-09: qty 100

## 2020-07-09 MED ORDER — PLASMA-LYTE 148 IV SOLN
INTRAVENOUS | Status: DC
  Filled 2020-07-09: qty 2.5

## 2020-07-09 MED ORDER — TRANEXAMIC ACID (OHS) PUMP PRIME SOLUTION
2.0000 mg/kg | INTRAVENOUS | Status: DC
  Filled 2020-07-09: qty 1.64

## 2020-07-09 MED ORDER — SODIUM CHLORIDE 0.9 % IV SOLN
750.0000 mg | INTRAVENOUS | Status: AC
  Administered 2020-07-10: 750 mg via INTRAVENOUS
  Filled 2020-07-09: qty 750

## 2020-07-09 MED ORDER — VANCOMYCIN HCL 1250 MG/250ML IV SOLN
1250.0000 mg | INTRAVENOUS | Status: AC
  Administered 2020-07-10: 1250 mg via INTRAVENOUS
  Filled 2020-07-09: qty 250

## 2020-07-09 MED ORDER — EPINEPHRINE HCL 5 MG/250ML IV SOLN IN NS
0.0000 ug/min | INTRAVENOUS | Status: DC
  Filled 2020-07-09: qty 250

## 2020-07-09 MED ORDER — TRANEXAMIC ACID 1000 MG/10ML IV SOLN
1.5000 mg/kg/h | INTRAVENOUS | Status: AC
  Administered 2020-07-10: 1.5 mg/kg/h via INTRAVENOUS
  Filled 2020-07-09: qty 25

## 2020-07-09 MED ORDER — NITROGLYCERIN IN D5W 200-5 MCG/ML-% IV SOLN
2.0000 ug/min | INTRAVENOUS | Status: AC
  Administered 2020-07-10: 16.6 ug/min via INTRAVENOUS
  Filled 2020-07-09: qty 250

## 2020-07-09 MED ORDER — INSULIN REGULAR(HUMAN) IN NACL 100-0.9 UT/100ML-% IV SOLN
INTRAVENOUS | Status: AC
  Administered 2020-07-10: 1 [IU]/h via INTRAVENOUS
  Filled 2020-07-09: qty 100

## 2020-07-09 MED ORDER — PHENYLEPHRINE HCL-NACL 20-0.9 MG/250ML-% IV SOLN
30.0000 ug/min | INTRAVENOUS | Status: AC
  Administered 2020-07-10: 25 ug/min via INTRAVENOUS
  Filled 2020-07-09: qty 250

## 2020-07-09 MED ORDER — NOREPINEPHRINE 4 MG/250ML-% IV SOLN
0.0000 ug/min | INTRAVENOUS | Status: DC
  Filled 2020-07-09: qty 250

## 2020-07-09 NOTE — Anesthesia Preprocedure Evaluation (Addendum)
Anesthesia Evaluation  Patient identified by MRN, date of birth, ID band Patient awake    Reviewed: Allergy & Precautions, NPO status , Patient's Chart, lab work & pertinent test results  Airway Mallampati: II  TM Distance: >3 FB Neck ROM: Full    Dental  (+) Dental Advisory Given, Teeth Intact   Pulmonary neg pulmonary ROS,    Pulmonary exam normal breath sounds clear to auscultation       Cardiovascular hypertension, Pt. on medications + CAD  Normal cardiovascular exam+ dysrhythmias Atrial Fibrillation  Rhythm:Regular Rate:Normal     Neuro/Psych PSYCHIATRIC DISORDERS Anxiety Depression negative neurological ROS     GI/Hepatic Neg liver ROS, GERD  ,  Endo/Other  negative endocrine ROS  Renal/GU negative Renal ROS     Musculoskeletal  (+) Arthritis ,   Abdominal   Peds  Hematology negative hematology ROS (+)   Anesthesia Other Findings   Reproductive/Obstetrics                            Anesthesia Physical Anesthesia Plan  ASA: IV  Anesthesia Plan: General   Post-op Pain Management:    Induction: Intravenous  PONV Risk Score and Plan: 4 or greater and Treatment may vary due to age or medical condition and Midazolam  Airway Management Planned: Oral ETT  Additional Equipment: Arterial line, CVP, PA Cath, TEE and Ultrasound Guidance Line Placement  Intra-op Plan: Utilization Of Total Body Hypothermia per surgeon request  Post-operative Plan: Post-operative intubation/ventilation  Informed Consent: I have reviewed the patients History and Physical, chart, labs and discussed the procedure including the risks, benefits and alternatives for the proposed anesthesia with the patient or authorized representative who has indicated his/her understanding and acceptance.     Dental advisory given  Plan Discussed with: CRNA  Anesthesia Plan Comments:        Anesthesia Quick  Evaluation

## 2020-07-09 NOTE — Progress Notes (Addendum)
PCP - [name] Cardiologist - Dr. Allegra Lai and Dr. Sherren Mocha  PPM/ICD - denies   Chest x-ray - 07/09/20 EKG - 06/27/20 Stress Test - 12/15/11 ECHO - 07/07/19 Cardiac Cath -07/01/20   Sleep Study -denies      Patient instructed to hold all NSAID's, herbal medications, fish oil and vitamins 7 days prior to surgery. Pt instructed to stop eliquis 2 days prior to surgery. Pt told to keep taking aspirin through the day before surgery. Do not take aspirin the day of surgery.    ERAS Protcol -no   COVID TEST- 07/09/20   Anesthesia review: no  Patient denies shortness of breath, fever, cough and chest pain at PAT appointment   All instructions explained to the patient, with a verbal understanding of the material. Patient agrees to go over the instructions while at home for a better understanding. Patient also instructed to self quarantine after being tested for COVID-19. The opportunity to ask questions was provided.

## 2020-07-10 ENCOUNTER — Inpatient Hospital Stay (HOSPITAL_COMMUNITY): Payer: PPO

## 2020-07-10 ENCOUNTER — Other Ambulatory Visit: Payer: Self-pay

## 2020-07-10 ENCOUNTER — Inpatient Hospital Stay (HOSPITAL_COMMUNITY)
Admission: RE | Disposition: A | Discharge status: Home Health | Payer: Self-pay | Source: Home / Self Care | Attending: Thoracic Surgery (Cardiothoracic Vascular Surgery)

## 2020-07-10 ENCOUNTER — Inpatient Hospital Stay (HOSPITAL_COMMUNITY)
Admission: RE | Admit: 2020-07-10 | Discharge: 2020-07-16 | DRG: 236 | Disposition: A | Discharge status: Home Health | Payer: PPO | Attending: Thoracic Surgery (Cardiothoracic Vascular Surgery) | Admitting: Thoracic Surgery (Cardiothoracic Vascular Surgery)

## 2020-07-10 ENCOUNTER — Inpatient Hospital Stay (HOSPITAL_COMMUNITY): Payer: PPO | Admitting: Certified Registered Nurse Anesthetist

## 2020-07-10 DIAGNOSIS — E8770 Fluid overload, unspecified: Secondary | ICD-10-CM | POA: Diagnosis not present

## 2020-07-10 DIAGNOSIS — F32A Depression, unspecified: Secondary | ICD-10-CM | POA: Diagnosis present

## 2020-07-10 DIAGNOSIS — Z885 Allergy status to narcotic agent status: Secondary | ICD-10-CM | POA: Diagnosis not present

## 2020-07-10 DIAGNOSIS — I480 Paroxysmal atrial fibrillation: Secondary | ICD-10-CM | POA: Diagnosis present

## 2020-07-10 DIAGNOSIS — Z7982 Long term (current) use of aspirin: Secondary | ICD-10-CM | POA: Diagnosis not present

## 2020-07-10 DIAGNOSIS — D62 Acute posthemorrhagic anemia: Secondary | ICD-10-CM | POA: Diagnosis not present

## 2020-07-10 DIAGNOSIS — Z90710 Acquired absence of both cervix and uterus: Secondary | ICD-10-CM

## 2020-07-10 DIAGNOSIS — J90 Pleural effusion, not elsewhere classified: Secondary | ICD-10-CM | POA: Diagnosis not present

## 2020-07-10 DIAGNOSIS — J9383 Other pneumothorax: Secondary | ICD-10-CM | POA: Diagnosis not present

## 2020-07-10 DIAGNOSIS — F419 Anxiety disorder, unspecified: Secondary | ICD-10-CM | POA: Diagnosis present

## 2020-07-10 DIAGNOSIS — E782 Mixed hyperlipidemia: Secondary | ICD-10-CM | POA: Diagnosis present

## 2020-07-10 DIAGNOSIS — Z823 Family history of stroke: Secondary | ICD-10-CM | POA: Diagnosis not present

## 2020-07-10 DIAGNOSIS — I2510 Atherosclerotic heart disease of native coronary artery without angina pectoris: Secondary | ICD-10-CM | POA: Diagnosis present

## 2020-07-10 DIAGNOSIS — Z79899 Other long term (current) drug therapy: Secondary | ICD-10-CM

## 2020-07-10 DIAGNOSIS — I10 Essential (primary) hypertension: Secondary | ICD-10-CM | POA: Diagnosis present

## 2020-07-10 DIAGNOSIS — J9811 Atelectasis: Secondary | ICD-10-CM

## 2020-07-10 DIAGNOSIS — Z20822 Contact with and (suspected) exposure to covid-19: Secondary | ICD-10-CM | POA: Diagnosis present

## 2020-07-10 DIAGNOSIS — Z7901 Long term (current) use of anticoagulants: Secondary | ICD-10-CM | POA: Diagnosis not present

## 2020-07-10 DIAGNOSIS — D696 Thrombocytopenia, unspecified: Secondary | ICD-10-CM | POA: Diagnosis present

## 2020-07-10 DIAGNOSIS — J939 Pneumothorax, unspecified: Secondary | ICD-10-CM | POA: Diagnosis not present

## 2020-07-10 DIAGNOSIS — H409 Unspecified glaucoma: Secondary | ICD-10-CM | POA: Diagnosis present

## 2020-07-10 DIAGNOSIS — Z951 Presence of aortocoronary bypass graft: Secondary | ICD-10-CM

## 2020-07-10 DIAGNOSIS — R001 Bradycardia, unspecified: Secondary | ICD-10-CM | POA: Diagnosis not present

## 2020-07-10 DIAGNOSIS — Z09 Encounter for follow-up examination after completed treatment for conditions other than malignant neoplasm: Secondary | ICD-10-CM

## 2020-07-10 DIAGNOSIS — I517 Cardiomegaly: Secondary | ICD-10-CM | POA: Diagnosis not present

## 2020-07-10 DIAGNOSIS — J811 Chronic pulmonary edema: Secondary | ICD-10-CM | POA: Diagnosis not present

## 2020-07-10 DIAGNOSIS — Z8249 Family history of ischemic heart disease and other diseases of the circulatory system: Secondary | ICD-10-CM | POA: Diagnosis not present

## 2020-07-10 HISTORY — PX: CORONARY ARTERY BYPASS GRAFT: SHX141

## 2020-07-10 HISTORY — PX: CLIPPING OF ATRIAL APPENDAGE: SHX5773

## 2020-07-10 HISTORY — PX: TEE WITHOUT CARDIOVERSION: SHX5443

## 2020-07-10 LAB — POCT I-STAT, CHEM 8
BUN: 25 mg/dL — ABNORMAL HIGH (ref 8–23)
BUN: 25 mg/dL — ABNORMAL HIGH (ref 8–23)
BUN: 23 mg/dL (ref 8–23)
BUN: 22 mg/dL (ref 8–23)
Calcium, Ion: 1.23 mmol/L (ref 1.15–1.40)
Calcium, Ion: 1.2 mmol/L (ref 1.15–1.40)
Calcium, Ion: 1.09 mmol/L — ABNORMAL LOW (ref 1.15–1.40)
Calcium, Ion: 1.25 mmol/L (ref 1.15–1.40)
Chloride: 102 mmol/L (ref 98–111)
Chloride: 102 mmol/L (ref 98–111)
Chloride: 101 mmol/L (ref 98–111)
Chloride: 103 mmol/L (ref 98–111)
Creatinine, Ser: 1.1 mg/dL — ABNORMAL HIGH (ref 0.44–1.00)
Creatinine, Ser: 1.1 mg/dL — ABNORMAL HIGH (ref 0.44–1.00)
Creatinine, Ser: 1 mg/dL (ref 0.44–1.00)
Creatinine, Ser: 0.8 mg/dL (ref 0.44–1.00)
Glucose, Bld: 115 mg/dL — ABNORMAL HIGH (ref 70–99)
Glucose, Bld: 125 mg/dL — ABNORMAL HIGH (ref 70–99)
Glucose, Bld: 136 mg/dL — ABNORMAL HIGH (ref 70–99)
Glucose, Bld: 121 mg/dL — ABNORMAL HIGH (ref 70–99)
HCT: 26 % — ABNORMAL LOW (ref 36.0–46.0)
HCT: 23 % — ABNORMAL LOW (ref 36.0–46.0)
HCT: 21 % — ABNORMAL LOW (ref 36.0–46.0)
HCT: 22 % — ABNORMAL LOW (ref 36.0–46.0)
Hemoglobin: 8.8 g/dL — ABNORMAL LOW (ref 12.0–15.0)
Hemoglobin: 7.8 g/dL — ABNORMAL LOW (ref 12.0–15.0)
Hemoglobin: 7.1 g/dL — ABNORMAL LOW (ref 12.0–15.0)
Hemoglobin: 7.5 g/dL — ABNORMAL LOW (ref 12.0–15.0)
Potassium: 3.9 mmol/L (ref 3.5–5.1)
Potassium: 3.9 mmol/L (ref 3.5–5.1)
Potassium: 4.5 mmol/L (ref 3.5–5.1)
Potassium: 4 mmol/L (ref 3.5–5.1)
Sodium: 139 mmol/L (ref 135–145)
Sodium: 138 mmol/L (ref 135–145)
Sodium: 136 mmol/L (ref 135–145)
Sodium: 139 mmol/L (ref 135–145)
TCO2: 25 mmol/L (ref 22–32)
TCO2: 24 mmol/L (ref 22–32)
TCO2: 26 mmol/L (ref 22–32)
TCO2: 24 mmol/L (ref 22–32)

## 2020-07-10 LAB — POCT I-STAT 7, (LYTES, BLD GAS, ICA,H+H)
Acid-Base Excess: 4 mmol/L — ABNORMAL HIGH (ref 0.0–2.0)
Acid-Base Excess: 2 mmol/L (ref 0.0–2.0)
Acid-Base Excess: 2 mmol/L (ref 0.0–2.0)
Bicarbonate: 27.6 mmol/L (ref 20.0–28.0)
Bicarbonate: 26.3 mmol/L (ref 20.0–28.0)
Bicarbonate: 25.2 mmol/L (ref 20.0–28.0)
Calcium, Ion: 1 mmol/L — ABNORMAL LOW (ref 1.15–1.40)
Calcium, Ion: 1 mmol/L — ABNORMAL LOW (ref 1.15–1.40)
Calcium, Ion: 0.98 mmol/L — ABNORMAL LOW (ref 1.15–1.40)
HCT: 20 % — ABNORMAL LOW (ref 36.0–46.0)
HCT: 22 % — ABNORMAL LOW (ref 36.0–46.0)
HCT: 24 % — ABNORMAL LOW (ref 36.0–46.0)
Hemoglobin: 6.8 g/dL — CL (ref 12.0–15.0)
Hemoglobin: 7.5 g/dL — ABNORMAL LOW (ref 12.0–15.0)
Hemoglobin: 8.2 g/dL — ABNORMAL LOW (ref 12.0–15.0)
O2 Saturation: 100 %
O2 Saturation: 100 %
O2 Saturation: 100 %
Potassium: 3.7 mmol/L (ref 3.5–5.1)
Potassium: 4.6 mmol/L (ref 3.5–5.1)
Potassium: 4.8 mmol/L (ref 3.5–5.1)
Sodium: 139 mmol/L (ref 135–145)
Sodium: 136 mmol/L (ref 135–145)
Sodium: 136 mmol/L (ref 135–145)
TCO2: 29 mmol/L (ref 22–32)
TCO2: 27 mmol/L (ref 22–32)
TCO2: 26 mmol/L (ref 22–32)
pCO2 arterial: 37.8 mmHg (ref 32.0–48.0)
pCO2 arterial: 36.6 mmHg (ref 32.0–48.0)
pCO2 arterial: 32.6 mmHg (ref 32.0–48.0)
pH, Arterial: 7.472 — ABNORMAL HIGH (ref 7.350–7.450)
pH, Arterial: 7.465 — ABNORMAL HIGH (ref 7.350–7.450)
pH, Arterial: 7.496 — ABNORMAL HIGH (ref 7.350–7.450)
pO2, Arterial: 404 mmHg — ABNORMAL HIGH (ref 83.0–108.0)
pO2, Arterial: 288 mmHg — ABNORMAL HIGH (ref 83.0–108.0)
pO2, Arterial: 347 mmHg — ABNORMAL HIGH (ref 83.0–108.0)

## 2020-07-10 LAB — POCT I-STAT EG7
Acid-Base Excess: 0 mmol/L (ref 0.0–2.0)
Bicarbonate: 24.7 mmol/L (ref 20.0–28.0)
Calcium, Ion: 1.05 mmol/L — ABNORMAL LOW (ref 1.15–1.40)
HCT: 20 % — ABNORMAL LOW (ref 36.0–46.0)
Hemoglobin: 6.8 g/dL — CL (ref 12.0–15.0)
O2 Saturation: 75 %
Potassium: 3.6 mmol/L (ref 3.5–5.1)
Sodium: 137 mmol/L (ref 135–145)
TCO2: 26 mmol/L (ref 22–32)
pCO2, Ven: 37.2 mmHg — ABNORMAL LOW (ref 44.0–60.0)
pH, Ven: 7.43 (ref 7.250–7.430)
pO2, Ven: 39 mmHg (ref 32.0–45.0)

## 2020-07-10 LAB — CBC
HCT: 31.5 % — ABNORMAL LOW (ref 36.0–46.0)
HCT: 30.3 % — ABNORMAL LOW (ref 36.0–46.0)
Hemoglobin: 10.6 g/dL — ABNORMAL LOW (ref 12.0–15.0)
Hemoglobin: 10.2 g/dL — ABNORMAL LOW (ref 12.0–15.0)
MCH: 30.4 pg (ref 26.0–34.0)
MCH: 30.5 pg (ref 26.0–34.0)
MCHC: 33.7 g/dL (ref 30.0–36.0)
MCHC: 33.7 g/dL (ref 30.0–36.0)
MCV: 90.3 fL (ref 80.0–100.0)
MCV: 90.7 fL (ref 80.0–100.0)
Platelets: 92 10*3/uL — ABNORMAL LOW (ref 150–400)
Platelets: 84 10*3/uL — ABNORMAL LOW (ref 150–400)
RBC: 3.49 MIL/uL — ABNORMAL LOW (ref 3.87–5.11)
RBC: 3.34 MIL/uL — ABNORMAL LOW (ref 3.87–5.11)
RDW: 14 % (ref 11.5–15.5)
RDW: 14.2 % (ref 11.5–15.5)
WBC: 19.1 10*3/uL — ABNORMAL HIGH (ref 4.0–10.5)
WBC: 12.1 10*3/uL — ABNORMAL HIGH (ref 4.0–10.5)
nRBC: 0 % (ref 0.0–0.2)
nRBC: 0 % (ref 0.0–0.2)

## 2020-07-10 LAB — BASIC METABOLIC PANEL
Anion gap: 4 — ABNORMAL LOW (ref 5–15)
BUN: 20 mg/dL (ref 8–23)
CO2: 24 mmol/L (ref 22–32)
Calcium: 8.1 mg/dL — ABNORMAL LOW (ref 8.9–10.3)
Chloride: 109 mmol/L (ref 98–111)
Creatinine, Ser: 1.2 mg/dL — ABNORMAL HIGH (ref 0.44–1.00)
GFR, Estimated: 48 mL/min — ABNORMAL LOW (ref >60)
Glucose, Bld: 123 mg/dL — ABNORMAL HIGH (ref 70–99)
Potassium: 4.5 mmol/L (ref 3.5–5.1)
Sodium: 137 mmol/L (ref 135–145)

## 2020-07-10 LAB — ECHO INTRAOPERATIVE TEE
AV Mean grad: 5 mmHg
AV Peak grad: 9.1 mmHg
Ao pk vel: 1.51 m/s
Height: 68 in
Weight: 2895.96 oz

## 2020-07-10 LAB — GLUCOSE, CAPILLARY
Glucose-Capillary: 114 mg/dL — ABNORMAL HIGH (ref 70–99)
Glucose-Capillary: 120 mg/dL — ABNORMAL HIGH (ref 70–99)
Glucose-Capillary: 121 mg/dL — ABNORMAL HIGH (ref 70–99)
Glucose-Capillary: 123 mg/dL — ABNORMAL HIGH (ref 70–99)
Glucose-Capillary: 128 mg/dL — ABNORMAL HIGH (ref 70–99)
Glucose-Capillary: 130 mg/dL — ABNORMAL HIGH (ref 70–99)
Glucose-Capillary: 133 mg/dL — ABNORMAL HIGH (ref 70–99)
Glucose-Capillary: 138 mg/dL — ABNORMAL HIGH (ref 70–99)

## 2020-07-10 LAB — ABO/RH: ABO/RH(D): A POS

## 2020-07-10 LAB — PREPARE RBC (CROSSMATCH)

## 2020-07-10 LAB — MAGNESIUM: Magnesium: 3.4 mg/dL — ABNORMAL HIGH (ref 1.7–2.4)

## 2020-07-10 LAB — APTT: aPTT: 34 seconds (ref 24–36)

## 2020-07-10 LAB — PLATELET COUNT: Platelets: 115 10*3/uL — ABNORMAL LOW (ref 150–400)

## 2020-07-10 LAB — PROTIME-INR
INR: 1.4 — ABNORMAL HIGH (ref 0.8–1.2)
Prothrombin Time: 17 seconds — ABNORMAL HIGH (ref 11.4–15.2)

## 2020-07-10 LAB — HEMOGLOBIN AND HEMATOCRIT, BLOOD
HCT: 22.4 % — ABNORMAL LOW (ref 36.0–46.0)
Hemoglobin: 7.5 g/dL — ABNORMAL LOW (ref 12.0–15.0)

## 2020-07-10 SURGERY — CORONARY ARTERY BYPASS GRAFTING (CABG)
Anesthesia: General | Site: Chest

## 2020-07-10 MED ORDER — ORAL CARE MOUTH RINSE: 15.0000 mL | Freq: Once | OROMUCOSAL | Status: AC

## 2020-07-10 MED ORDER — ALBUMIN HUMAN 5 % IV SOLN: 12.5000 g | Freq: Once | INTRAVENOUS | Status: DC

## 2020-07-10 MED ORDER — ONDANSETRON HCL 4 MG/2ML IJ SOLN
4.0000 mg | Freq: Four times a day (QID) | INTRAMUSCULAR | Status: DC | PRN
  Administered 2020-07-11 – 2020-07-12 (×2): 4 mg via INTRAVENOUS
  Filled 2020-07-10 (×2): qty 2

## 2020-07-10 MED ORDER — LACTATED RINGERS IV SOLN: INTRAVENOUS | Status: DC

## 2020-07-10 MED ORDER — FENTANYL CITRATE (PF) 100 MCG/2ML IJ SOLN
INTRAMUSCULAR | Status: DC | PRN
  Administered 2020-07-10: 250 ug via INTRAVENOUS
  Administered 2020-07-10: 100 ug via INTRAVENOUS
  Administered 2020-07-10: 300 ug via INTRAVENOUS
  Administered 2020-07-10: 150 ug via INTRAVENOUS
  Administered 2020-07-10: 50 ug via INTRAVENOUS
  Administered 2020-07-10 (×2): 200 ug via INTRAVENOUS

## 2020-07-10 MED ORDER — PROPOFOL 10 MG/ML IV BOLUS
INTRAVENOUS | Status: DC | PRN
Start: 2020-07-10 — End: 2020-07-10
  Administered 2020-07-10: 50 mg via INTRAVENOUS

## 2020-07-10 MED ORDER — BISACODYL 5 MG PO TBEC
10.0000 mg | DELAYED_RELEASE_TABLET | Freq: Every day | ORAL | Status: DC
  Administered 2020-07-11 – 2020-07-14 (×3): 10 mg via ORAL
  Filled 2020-07-10 (×4): qty 2

## 2020-07-10 MED ORDER — CHLORHEXIDINE GLUCONATE 0.12 % MT SOLN
15.0000 mL | OROMUCOSAL | Status: AC
  Administered 2020-07-10: 15 mL via OROMUCOSAL

## 2020-07-10 MED ORDER — TRAMADOL HCL 50 MG PO TABS
50.0000 mg | ORAL_TABLET | ORAL | Status: DC | PRN
  Administered 2020-07-11: 50 mg via ORAL
  Administered 2020-07-11: 100 mg via ORAL
  Administered 2020-07-11 – 2020-07-12 (×2): 50 mg via ORAL
  Filled 2020-07-10 (×2): qty 1
  Filled 2020-07-10: qty 2
  Filled 2020-07-10: qty 1

## 2020-07-10 MED ORDER — ROSUVASTATIN CALCIUM 5 MG PO TABS
10.0000 mg | ORAL_TABLET | Freq: Every day | ORAL | Status: DC
  Administered 2020-07-11 – 2020-07-16 (×6): 10 mg via ORAL
  Filled 2020-07-10 (×6): qty 2

## 2020-07-10 MED ORDER — CHLORHEXIDINE GLUCONATE CLOTH 2 % EX PADS
6.0000 | MEDICATED_PAD | Freq: Every day | CUTANEOUS | Status: DC
  Administered 2020-07-11 – 2020-07-16 (×6): 6 via TOPICAL

## 2020-07-10 MED ORDER — LACTATED RINGERS IV SOLN: INTRAVENOUS | Status: DC | PRN

## 2020-07-10 MED ORDER — VANCOMYCIN HCL IN DEXTROSE 1-5 GM/200ML-% IV SOLN
1000.0000 mg | Freq: Once | INTRAVENOUS | Status: AC
  Administered 2020-07-10: 1000 mg via INTRAVENOUS
  Filled 2020-07-10: qty 200

## 2020-07-10 MED ORDER — METOPROLOL TARTRATE 5 MG/5ML IV SOLN: 2.5000 mg | INTRAVENOUS | Status: DC | PRN

## 2020-07-10 MED ORDER — METOPROLOL TARTRATE 12.5 MG HALF TABLET
12.5000 mg | ORAL_TABLET | Freq: Once | ORAL | Status: DC
  Filled 2020-07-10: qty 1

## 2020-07-10 MED ORDER — HEPARIN SODIUM (PORCINE) 1000 UNIT/ML IJ SOLN
INTRAMUSCULAR | Status: DC | PRN
  Administered 2020-07-10: 5000 [IU] via INTRAVENOUS
  Administered 2020-07-10: 20000 [IU] via INTRAVENOUS

## 2020-07-10 MED ORDER — METOPROLOL TARTRATE 25 MG/10 ML ORAL SUSPENSION: 12.5000 mg | Freq: Two times a day (BID) | ORAL | Status: DC

## 2020-07-10 MED ORDER — ORAL CARE MOUTH RINSE: 15.0000 mL | Freq: Once | OROMUCOSAL | Status: DC

## 2020-07-10 MED ORDER — METOPROLOL TARTRATE 12.5 MG HALF TABLET
12.5000 mg | ORAL_TABLET | Freq: Two times a day (BID) | ORAL | Status: DC
  Administered 2020-07-11 (×2): 12.5 mg via ORAL
  Filled 2020-07-10 (×2): qty 1

## 2020-07-10 MED ORDER — FAMOTIDINE IN NACL 20-0.9 MG/50ML-% IV SOLN
20.0000 mg | Freq: Two times a day (BID) | INTRAVENOUS | Status: AC
  Administered 2020-07-10 (×2): 20 mg via INTRAVENOUS
  Filled 2020-07-10 (×2): qty 50

## 2020-07-10 MED ORDER — MIDAZOLAM HCL (PF) 10 MG/2ML IJ SOLN
INTRAMUSCULAR | Status: AC
  Filled 2020-07-10: qty 2

## 2020-07-10 MED ORDER — FENTANYL CITRATE (PF) 250 MCG/5ML IJ SOLN
INTRAMUSCULAR | Status: AC
  Filled 2020-07-10: qty 5

## 2020-07-10 MED ORDER — PROTAMINE SULFATE 10 MG/ML IV SOLN
INTRAVENOUS | Status: AC
  Filled 2020-07-10: qty 25

## 2020-07-10 MED ORDER — OXYCODONE HCL 5 MG PO TABS
5.0000 mg | ORAL_TABLET | ORAL | Status: DC | PRN
  Administered 2020-07-12: 10 mg via ORAL
  Administered 2020-07-12: 5 mg via ORAL
  Administered 2020-07-13: 10 mg via ORAL
  Administered 2020-07-14: 5 mg via ORAL
  Filled 2020-07-10: qty 2
  Filled 2020-07-10: qty 1
  Filled 2020-07-10: qty 2
  Filled 2020-07-10: qty 1
  Filled 2020-07-10: qty 2

## 2020-07-10 MED ORDER — DOCUSATE SODIUM 100 MG PO CAPS
200.0000 mg | ORAL_CAPSULE | Freq: Every day | ORAL | Status: DC
  Administered 2020-07-11 – 2020-07-14 (×3): 200 mg via ORAL
  Filled 2020-07-10 (×4): qty 2

## 2020-07-10 MED ORDER — BISACODYL 10 MG RE SUPP: 10.0000 mg | Freq: Every day | RECTAL | Status: DC

## 2020-07-10 MED ORDER — ORAL CARE MOUTH RINSE
15.0000 mL | OROMUCOSAL | Status: DC
  Administered 2020-07-10 – 2020-07-11 (×3): 15 mL via OROMUCOSAL

## 2020-07-10 MED ORDER — PROPOFOL 10 MG/ML IV BOLUS
INTRAVENOUS | Status: AC
  Filled 2020-07-10: qty 20

## 2020-07-10 MED ORDER — ROCURONIUM BROMIDE 10 MG/ML (PF) SYRINGE
PREFILLED_SYRINGE | INTRAVENOUS | Status: DC | PRN
  Administered 2020-07-10: 50 mg via INTRAVENOUS
  Administered 2020-07-10: 100 mg via INTRAVENOUS
  Administered 2020-07-10: 50 mg via INTRAVENOUS

## 2020-07-10 MED ORDER — PLASMA-LYTE 148 IV SOLN
INTRAVENOUS | Status: DC | PRN
  Administered 2020-07-10: 500 mL

## 2020-07-10 MED ORDER — SODIUM CHLORIDE 0.9% IV SOLUTION: Freq: Once | INTRAVENOUS | Status: DC

## 2020-07-10 MED ORDER — MAGNESIUM SULFATE 4 GM/100ML IV SOLN
4.0000 g | Freq: Once | INTRAVENOUS | Status: AC
  Administered 2020-07-10: 4 g via INTRAVENOUS
  Filled 2020-07-10: qty 100

## 2020-07-10 MED ORDER — SODIUM CHLORIDE 0.9% FLUSH
3.0000 mL | Freq: Two times a day (BID) | INTRAVENOUS | Status: DC
  Administered 2020-07-11 – 2020-07-12 (×3): 3 mL via INTRAVENOUS

## 2020-07-10 MED ORDER — FENTANYL CITRATE (PF) 250 MCG/5ML IJ SOLN
INTRAMUSCULAR | Status: AC
  Filled 2020-07-10: qty 20

## 2020-07-10 MED ORDER — ACETAMINOPHEN 160 MG/5ML PO SOLN: 650.0000 mg | Freq: Once | ORAL | Status: AC

## 2020-07-10 MED ORDER — PHENYLEPHRINE HCL-NACL 20-0.9 MG/250ML-% IV SOLN: 0.0000 ug/min | INTRAVENOUS | Status: DC

## 2020-07-10 MED ORDER — SODIUM CHLORIDE 0.9% FLUSH: 3.0000 mL | INTRAVENOUS | Status: DC | PRN

## 2020-07-10 MED ORDER — LACTATED RINGERS IV SOLN: 500.0000 mL | Freq: Once | INTRAVENOUS | Status: DC | PRN

## 2020-07-10 MED ORDER — ALBUMIN HUMAN 5 % IV SOLN: INTRAVENOUS | Status: DC | PRN

## 2020-07-10 MED ORDER — HEPARIN SODIUM (PORCINE) 1000 UNIT/ML IJ SOLN
INTRAMUSCULAR | Status: AC
  Filled 2020-07-10: qty 1

## 2020-07-10 MED ORDER — ASPIRIN 81 MG PO CHEW: 324.0000 mg | CHEWABLE_TABLET | Freq: Every day | ORAL | Status: DC

## 2020-07-10 MED ORDER — PANTOPRAZOLE SODIUM 40 MG PO TBEC
40.0000 mg | DELAYED_RELEASE_TABLET | Freq: Every day | ORAL | Status: DC
  Administered 2020-07-12 – 2020-07-16 (×5): 40 mg via ORAL
  Filled 2020-07-10 (×5): qty 1

## 2020-07-10 MED ORDER — SODIUM CHLORIDE 0.9 % IV SOLN: 250.0000 mL | INTRAVENOUS | Status: DC

## 2020-07-10 MED ORDER — INSULIN REGULAR(HUMAN) IN NACL 100-0.9 UT/100ML-% IV SOLN: INTRAVENOUS | Status: DC

## 2020-07-10 MED ORDER — SODIUM BICARBONATE 8.4 % IV SOLN
50.0000 meq | Freq: Once | INTRAVENOUS | Status: AC
  Administered 2020-07-10: 50 meq via INTRAVENOUS

## 2020-07-10 MED ORDER — DEXTROSE 50 % IV SOLN: 0.0000 mL | INTRAVENOUS | Status: DC | PRN

## 2020-07-10 MED ORDER — NITROGLYCERIN IN D5W 200-5 MCG/ML-% IV SOLN: 0.0000 ug/min | INTRAVENOUS | Status: DC

## 2020-07-10 MED ORDER — ACETAMINOPHEN 650 MG RE SUPP
650.0000 mg | Freq: Once | RECTAL | Status: AC
  Administered 2020-07-10: 650 mg via RECTAL

## 2020-07-10 MED ORDER — 0.9 % SODIUM CHLORIDE (POUR BTL) OPTIME
TOPICAL | Status: DC | PRN
  Administered 2020-07-10: 4000 mL

## 2020-07-10 MED ORDER — SODIUM CHLORIDE 0.9 % IV SOLN: INTRAVENOUS | Status: DC

## 2020-07-10 MED ORDER — EPHEDRINE SULFATE-NACL 50-0.9 MG/10ML-% IV SOSY
PREFILLED_SYRINGE | INTRAVENOUS | Status: DC | PRN
  Administered 2020-07-10: 10 mg via INTRAVENOUS
  Administered 2020-07-10: 5 mg via INTRAVENOUS
  Administered 2020-07-10: 10 mg via INTRAVENOUS

## 2020-07-10 MED ORDER — PROTAMINE SULFATE 10 MG/ML IV SOLN
INTRAVENOUS | Status: DC | PRN
  Administered 2020-07-10: 10 mg via INTRAVENOUS
  Administered 2020-07-10: 120 mg via INTRAVENOUS
  Administered 2020-07-10: 90 mg via INTRAVENOUS

## 2020-07-10 MED ORDER — DEXMEDETOMIDINE HCL IN NACL 400 MCG/100ML IV SOLN
0.1000 ug/kg/h | INTRAVENOUS | Status: DC
  Filled 2020-07-10: qty 100

## 2020-07-10 MED ORDER — SODIUM CHLORIDE 0.9 % IV SOLN
1.5000 g | Freq: Two times a day (BID) | INTRAVENOUS | Status: AC
  Administered 2020-07-10 – 2020-07-12 (×4): 1.5 g via INTRAVENOUS
  Filled 2020-07-10 (×3): qty 1.5

## 2020-07-10 MED ORDER — DEXMEDETOMIDINE HCL IN NACL 400 MCG/100ML IV SOLN
0.0000 ug/kg/h | INTRAVENOUS | Status: DC
  Administered 2020-07-10: 0.3 ug/kg/h via INTRAVENOUS

## 2020-07-10 MED ORDER — ASPIRIN EC 325 MG PO TBEC: 325.0000 mg | DELAYED_RELEASE_TABLET | Freq: Every day | ORAL | Status: DC

## 2020-07-10 MED ORDER — MIDAZOLAM HCL 2 MG/2ML IJ SOLN: 2.0000 mg | INTRAMUSCULAR | Status: DC | PRN

## 2020-07-10 MED ORDER — SODIUM CHLORIDE 0.45 % IV SOLN: INTRAVENOUS | Status: DC | PRN

## 2020-07-10 MED ORDER — POTASSIUM CHLORIDE 10 MEQ/50ML IV SOLN: 10.0000 meq | INTRAVENOUS | Status: AC

## 2020-07-10 MED ORDER — CHLORHEXIDINE GLUCONATE 0.12 % MT SOLN
15.0000 mL | Freq: Once | OROMUCOSAL | Status: AC
  Administered 2020-07-10: 15 mL via OROMUCOSAL
  Filled 2020-07-10: qty 15

## 2020-07-10 MED ORDER — ALBUMIN HUMAN 5 % IV SOLN
250.0000 mL | INTRAVENOUS | Status: AC | PRN
  Administered 2020-07-10 (×4): 12.5 g via INTRAVENOUS
  Filled 2020-07-10: qty 250

## 2020-07-10 MED ORDER — CHLORHEXIDINE GLUCONATE 4 % EX LIQD: 30.0000 mL | CUTANEOUS | Status: DC

## 2020-07-10 MED ORDER — CHLORHEXIDINE GLUCONATE 0.12% ORAL RINSE (MEDLINE KIT)
15.0000 mL | Freq: Two times a day (BID) | OROMUCOSAL | Status: DC
  Administered 2020-07-10: 15 mL via OROMUCOSAL

## 2020-07-10 MED ORDER — PHENYLEPHRINE 40 MCG/ML (10ML) SYRINGE FOR IV PUSH (FOR BLOOD PRESSURE SUPPORT)
PREFILLED_SYRINGE | INTRAVENOUS | Status: DC | PRN
  Administered 2020-07-10: 40 ug via INTRAVENOUS

## 2020-07-10 MED ORDER — ACETAMINOPHEN 500 MG PO TABS
1000.0000 mg | ORAL_TABLET | Freq: Four times a day (QID) | ORAL | Status: AC
  Administered 2020-07-11 – 2020-07-15 (×17): 1000 mg via ORAL
  Filled 2020-07-10 (×17): qty 2

## 2020-07-10 MED ORDER — ACETAMINOPHEN 160 MG/5ML PO SOLN: 1000.0000 mg | Freq: Four times a day (QID) | ORAL | Status: AC

## 2020-07-10 MED ORDER — CHLORHEXIDINE GLUCONATE 0.12 % MT SOLN: 15.0000 mL | Freq: Once | OROMUCOSAL | Status: DC

## 2020-07-10 MED ORDER — MORPHINE SULFATE (PF) 2 MG/ML IV SOLN
1.0000 mg | INTRAVENOUS | Status: DC | PRN
  Administered 2020-07-11 – 2020-07-12 (×4): 2 mg via INTRAVENOUS
  Filled 2020-07-10 (×4): qty 1

## 2020-07-10 MED ORDER — MIDAZOLAM HCL 2 MG/2ML IJ SOLN
INTRAMUSCULAR | Status: DC | PRN
  Administered 2020-07-10: 3 mg via INTRAVENOUS
  Administered 2020-07-10 (×2): 2 mg via INTRAVENOUS
  Administered 2020-07-10: 10 mg via INTRAVENOUS
  Administered 2020-07-10: 2 mg via INTRAVENOUS
  Administered 2020-07-10: 1 mg via INTRAVENOUS

## 2020-07-10 SURGICAL SUPPLY — 87 items
ATRICLIP EXCLUSION VLAA 35 (Miscellaneous) ×3 IMPLANT
BAG DECANTER FOR FLEXI CONT (MISCELLANEOUS) ×3 IMPLANT
BLADE CLIPPER SURG (BLADE) ×3 IMPLANT
BLADE MINI RND TIP GREEN BEAV (BLADE) ×3 IMPLANT
BLADE STERNUM SYSTEM 6 (BLADE) ×3 IMPLANT
BNDG ELASTIC 4X5.8 VLCR STR LF (GAUZE/BANDAGES/DRESSINGS) ×3 IMPLANT
BNDG ELASTIC 6X10 VLCR STRL LF (GAUZE/BANDAGES/DRESSINGS) ×3 IMPLANT
BNDG ELASTIC 6X5.8 VLCR STR LF (GAUZE/BANDAGES/DRESSINGS) ×3 IMPLANT
BNDG GAUZE ELAST 4 BULKY (GAUZE/BANDAGES/DRESSINGS) ×3 IMPLANT
CANISTER SUCT 3000ML PPV (MISCELLANEOUS) ×3 IMPLANT
CANNULA EZ GLIDE AORTIC 21FR (CANNULA) ×3 IMPLANT
CATH CPB KIT HENDRICKSON (MISCELLANEOUS) ×3 IMPLANT
CATH ROBINSON RED A/P 18FR (CATHETERS) ×3 IMPLANT
CATH THORACIC 36FR (CATHETERS) ×3 IMPLANT
CATH THORACIC 36FR RT ANG (CATHETERS) ×3 IMPLANT
CLIP FOGARTY SPRING 6M (CLIP) ×3 IMPLANT
CLIP RETRACTION 3.0MM CORONARY (MISCELLANEOUS) ×3 IMPLANT
CLIP VESOCCLUDE MED 24/CT (CLIP) IMPLANT
CLIP VESOCCLUDE SM WIDE 24/CT (CLIP) ×6 IMPLANT
DEVICE EXCLUSIN ATRCLP VLAA 35 (Miscellaneous) ×2 IMPLANT
DRAPE CARDIOVASCULAR INCISE (DRAPES) ×3
DRAPE SLUSH/WARMER DISC (DRAPES) ×3 IMPLANT
DRAPE SRG 135X102X78XABS (DRAPES) ×2 IMPLANT
DRSG COVADERM 4X14 (GAUZE/BANDAGES/DRESSINGS) ×3 IMPLANT
ELECT KIT SAFEOP EMG/NMJ (KITS) ×3
ELECT REM PT RETURN 9FT ADLT (ELECTROSURGICAL) ×6
ELECTRODE REM PT RTRN 9FT ADLT (ELECTROSURGICAL) ×4 IMPLANT
FELT TEFLON 1X6 (MISCELLANEOUS) ×6 IMPLANT
GAUZE SPONGE 4X4 12PLY STRL (GAUZE/BANDAGES/DRESSINGS) ×6 IMPLANT
GLOVE SURG SIGNA 7.5 PF LTX (GLOVE) ×9 IMPLANT
GOWN STRL REUS W/ TWL LRG LVL3 (GOWN DISPOSABLE) ×8 IMPLANT
GOWN STRL REUS W/ TWL XL LVL3 (GOWN DISPOSABLE) ×4 IMPLANT
GOWN STRL REUS W/TWL LRG LVL3 (GOWN DISPOSABLE) ×12
GOWN STRL REUS W/TWL XL LVL3 (GOWN DISPOSABLE) ×6
HEMOSTAT POWDER SURGIFOAM 1G (HEMOSTASIS) ×9 IMPLANT
HEMOSTAT SURGICEL 2X14 (HEMOSTASIS) ×3 IMPLANT
INSERT FOGARTY XLG (MISCELLANEOUS) IMPLANT
KIT BASIN OR (CUSTOM PROCEDURE TRAY) ×3 IMPLANT
KIT EMG SRFC ELECT (KITS) ×2 IMPLANT
KIT SUCTION CATH 14FR (SUCTIONS) ×6 IMPLANT
KIT TURNOVER KIT B (KITS) ×3 IMPLANT
KIT VASOVIEW HEMOPRO 2 VH 4000 (KITS) ×3 IMPLANT
MARKER GRAFT CORONARY BYPASS (MISCELLANEOUS) ×9 IMPLANT
NS IRRIG 1000ML POUR BTL (IV SOLUTION) ×15 IMPLANT
PACK E OPEN HEART (SUTURE) ×3 IMPLANT
PACK OPEN HEART (CUSTOM PROCEDURE TRAY) ×3 IMPLANT
PAD ARMBOARD 7.5X6 YLW CONV (MISCELLANEOUS) ×6 IMPLANT
PAD ELECT DEFIB RADIOL ZOLL (MISCELLANEOUS) ×3 IMPLANT
PENCIL BUTTON HOLSTER BLD 10FT (ELECTRODE) ×3 IMPLANT
POSITIONER HEAD DONUT 9IN (MISCELLANEOUS) ×3 IMPLANT
PUNCH AORTIC ROTATE 4.0MM (MISCELLANEOUS) IMPLANT
PUNCH AORTIC ROTATE 4.5MM 8IN (MISCELLANEOUS) ×3 IMPLANT
PUNCH AORTIC ROTATE 5MM 8IN (MISCELLANEOUS) IMPLANT
SET CARDIOPLEGIA MPS 5001102 (MISCELLANEOUS) ×3 IMPLANT
SUPPORT HEART JANKE-BARRON (MISCELLANEOUS) ×3 IMPLANT
SUT BONE WAX W31G (SUTURE) ×3 IMPLANT
SUT MNCRL AB 4-0 PS2 18 (SUTURE) IMPLANT
SUT PROLENE 3 0 SH DA (SUTURE) ×3 IMPLANT
SUT PROLENE 4 0 RB 1 (SUTURE) ×12
SUT PROLENE 4 0 SH DA (SUTURE) IMPLANT
SUT PROLENE 4-0 RB1 .5 CRCL 36 (SUTURE) ×8 IMPLANT
SUT PROLENE 5 0 C 1 36 (SUTURE) ×3 IMPLANT
SUT PROLENE 6 0 C 1 30 (SUTURE) ×9 IMPLANT
SUT PROLENE 7 0 BV 1 (SUTURE) ×6 IMPLANT
SUT PROLENE 7 0 BV1 MDA (SUTURE) ×9 IMPLANT
SUT PROLENE 8 0 BV175 6 (SUTURE) ×12 IMPLANT
SUT STEEL 6MS V (SUTURE) ×3 IMPLANT
SUT STEEL STERNAL CCS#1 18IN (SUTURE) IMPLANT
SUT STEEL SZ 6 DBL 3X14 BALL (SUTURE) ×3 IMPLANT
SUT VIC AB 1 CTX 36 (SUTURE) ×9
SUT VIC AB 1 CTX36XBRD ANBCTR (SUTURE) ×6 IMPLANT
SUT VIC AB 2-0 CT1 27 (SUTURE)
SUT VIC AB 2-0 CT1 TAPERPNT 27 (SUTURE) IMPLANT
SUT VIC AB 2-0 CTX 27 (SUTURE) IMPLANT
SUT VIC AB 3-0 SH 27 (SUTURE)
SUT VIC AB 3-0 SH 27X BRD (SUTURE) IMPLANT
SUT VIC AB 3-0 X1 27 (SUTURE) IMPLANT
SUT VICRYL 4-0 PS2 18IN ABS (SUTURE) IMPLANT
SYSTEM SAHARA CHEST DRAIN ATS (WOUND CARE) ×3 IMPLANT
TAPE CLOTH SURG 4X10 WHT LF (GAUZE/BANDAGES/DRESSINGS) ×6 IMPLANT
TAPE PAPER 2X10 WHT MICROPORE (GAUZE/BANDAGES/DRESSINGS) ×6 IMPLANT
TOWEL GREEN STERILE (TOWEL DISPOSABLE) ×3 IMPLANT
TOWEL GREEN STERILE FF (TOWEL DISPOSABLE) ×3 IMPLANT
TRAY FOLEY SLVR 16FR TEMP STAT (SET/KITS/TRAYS/PACK) ×3 IMPLANT
TUBING LAP HI FLOW INSUFFLATIO (TUBING) ×3 IMPLANT
UNDERPAD 30X36 HEAVY ABSORB (UNDERPADS AND DIAPERS) ×3 IMPLANT
WATER STERILE IRR 1000ML POUR (IV SOLUTION) ×6 IMPLANT

## 2020-07-10 NOTE — Transfer of Care (Signed)
Immediate Anesthesia Transfer of Care Note  Patient: Vanessa [name]  Procedure(s) Performed: CORONARY ARTERY BYPASS GRAFTING (CABG)x5. LEFT INTERNAL MAMMARY ARTERY. RIGHT SAPHENOUS VEIN HARVESTING. LEFT ATRIAL APPENDAGE CLIPPING. (N/A Chest) CLIPPING OF ATRIAL APPENDAGE (N/A ) TRANSESOPHAGEAL ECHOCARDIOGRAM (TEE) (N/A )  Patient Location: SICU  Anesthesia Type:General  Level of Consciousness: sedated and Patient remains intubated per anesthesia plan  Airway & Oxygen Therapy: Patient remains intubated per anesthesia plan and Patient placed on Ventilator (see vital sign flow sheet for setting)  Post-op Assessment: Report given to RN and Post -op Vital signs reviewed and stable  Post vital signs: Reviewed and stable  Last Vitals:  Vitals Value Taken Time  BP 122/64 07/10/20 1430  Temp 36.4 C 07/10/20 1441  Pulse 79 07/10/20 1441  Resp 12 07/10/20 1441  SpO2 89 % 07/10/20 1441  Vitals shown include unvalidated device data.  Last Pain:  Vitals:   07/10/20 0629  TempSrc:   PainSc: 3          Complications: No complications documented.

## 2020-07-10 NOTE — Progress Notes (Signed)
EVENING ROUNDS NOTE :     [address]       ,[place] [id number]             [phone]                 Day of Surgery Procedure(s) (LRB): CORONARY ARTERY BYPASS GRAFTING (CABG)x5. LEFT INTERNAL MAMMARY ARTERY. RIGHT SAPHENOUS VEIN HARVESTING. LEFT ATRIAL APPENDAGE CLIPPING. (N/A) CLIPPING OF ATRIAL APPENDAGE (N/A) TRANSESOPHAGEAL ECHOCARDIOGRAM (TEE) (N/A)   Total Length of Stay:  LOS: 0 days  Events:   Slow to wake up On nitro at 60 Minimal CT output  Will move for extubation    BP 122/64   Pulse 80   Temp (!) 96.44 F (35.8 C)   Resp 12   Ht 5\' 8"  (1.727 m)   Wt 82.1 kg   SpO2 100%   BMI 27.52 kg/m   PAP: (25)/(16-17) 25/16 CO:  [3.1 L/min-3.5 L/min] 3.5 L/min CI:  [1.5 L/min/m2-1.8 L/min/m2] 1.8 L/min/m2  Vent Mode: SIMV;PRVC;PSV FiO2 (%):  [50 %] 50 % Set Rate:  [12 bmp] 12 bmp Vt Set:  [510 mL] 510 mL PEEP:  [5 cmH20] 5 cmH20 Pressure Support:  [10 cmH20] 10 cmH20 Plateau Pressure:  [20 cmH20] 20 cmH20  . sodium chloride    . [START ON 07/11/2020] sodium chloride    . sodium chloride    . cefUROXime (ZINACEF)  IV    . dexmedetomidine (PRECEDEX) IV infusion 0.2 mcg/kg/hr (07/10/20 1700)  . famotidine (PEPCID) IV Stopped (07/10/20 1544)  . insulin 0.9 mL/hr at 07/10/20 1700  . lactated ringers    . lactated ringers    . lactated ringers 20 mL/hr at 07/10/20 1700  . magnesium sulfate 20 mL/hr at 07/10/20 1700  . nitroGLYCERIN 60 mcg/min (07/10/20 1700)  . phenylephrine (NEO-SYNEPHRINE) Adult infusion Stopped (07/10/20 1430)  . potassium chloride    . vancomycin      No intake/output data recorded.   CBC Latest Ref Rng & Units 07/10/2020 07/10/2020 07/10/2020  WBC 4.0 - 10.5 K/uL 19.1(H) - -  Hemoglobin 12.0 - 15.0 g/dL 10.6(L) 7.5(L) 8.2(L)  Hematocrit 36.0 - 46.0 % 31.5(L) 22.0(L) 24.0(L)  Platelets 150 - 400 K/uL 92(L) - -    BMP Latest Ref Rng & Units 07/10/2020 07/10/2020 07/10/2020  Glucose 70 - 99 mg/dL 121(H) - -  BUN 8  - 23 mg/dL 22 - -  Creatinine 0.44 - 1.00 mg/dL 0.80 - -  BUN/Creat Ratio 12 - 28 - - -  Sodium 135 - 145 mmol/L 139 136 136  Potassium 3.5 - 5.1 mmol/L 4.0 4.8 4.6  Chloride 98 - 111 mmol/L 103 - -  CO2 22 - 32 mmol/L - - -  Calcium 8.9 - 10.3 mg/dL - - -    ABG    Component Value Date/Time   PHART 7.496 (H) 07/10/2020 1253   PCO2ART 32.6 07/10/2020 1253   PO2ART 347 (H) 07/10/2020 1253   HCO3 25.2 07/10/2020 1253   TCO2 24 07/10/2020 1329   ACIDBASEDEF 1.0 07/09/2020 1138   O2SAT 100.0 07/10/2020 1253       Melodie Bouillon, MD 07/10/2020 5:06 PM

## 2020-07-10 NOTE — Addendum Note (Signed)
Addendum  created 07/10/20 1811 by Nolon Nations, MD   Clinical Note Signed

## 2020-07-10 NOTE — Anesthesia Postprocedure Evaluation (Addendum)
Anesthesia Post Note  Patient: Vanessa [name]  Procedure(s) Performed: CORONARY ARTERY BYPASS GRAFTING (CABG)x5. LEFT INTERNAL MAMMARY ARTERY. RIGHT SAPHENOUS VEIN HARVESTING. LEFT ATRIAL APPENDAGE CLIPPING. (N/A Chest) CLIPPING OF ATRIAL APPENDAGE (N/A ) TRANSESOPHAGEAL ECHOCARDIOGRAM (TEE) (N/A )     Patient location during evaluation: SICU Anesthesia Type: General Level of consciousness: sedated and patient remains intubated per anesthesia plan Pain management: pain level controlled Vital Signs Assessment: post-procedure vital signs reviewed and stable Respiratory status: patient remains intubated per anesthesia plan and patient on ventilator - see flowsheet for VS Cardiovascular status: stable Anesthetic complications: no   No complications documented.  Last Vitals:  Vitals:   07/10/20 1700 07/10/20 1800  BP:  109/75  Pulse: 80 80  Resp: 12 12  Temp: (!) 35.8 C (!) 36.4 C  SpO2: 100% 99%    Last Pain:  Vitals:   07/10/20 1500  TempSrc: Core  PainSc:                  Nolon Nations

## 2020-07-10 NOTE — Anesthesia Procedure Notes (Signed)
Arterial Line Insertion Start/End3/30/2022 6:55 AM, 07/10/2020 6:55 AM Performed by: Wilburn Cornelia, CRNA, CRNA  Patient location: Pre-op. Preanesthetic checklist: patient identified, IV checked, site marked, risks and benefits discussed, surgical consent, monitors and equipment checked, pre-op evaluation, timeout performed and anesthesia consent Lidocaine 1% used for infiltration and patient sedated Left, radial was placed Catheter size: 20 G  Attempts: 1 Procedure performed without using ultrasound guided technique. Following insertion, Biopatch and dressing applied. Post procedure assessment: normal  Patient tolerated the procedure well with no immediate complications.

## 2020-07-10 NOTE — Brief Op Note (Addendum)
07/10/2020  2:00 PM  PATIENT:  Vanessa [name]  70 y.o. female  PRE-OPERATIVE DIAGNOSIS: Three-vessel CAD, paroxysmal atrial fibrillation  POST-OPERATIVE DIAGNOSIS: Three-vessel CAD, paroxysmal atrial fibrillation  PROCEDURE:  Procedure(s): CORONARY ARTERY BYPASS GRAFTING (CABG)x5. LEFT INTERNAL MAMMARY ARTERY. RIGHT SAPHENOUS VEIN HARVESTING. LEFT ATRIAL APPENDAGE CLIPPING. (N/A) CLIPPING OF ATRIAL APPENDAGE (N/A) TRANSESOPHAGEAL ECHOCARDIOGRAM (TEE) (N/A)  LIMA-LAD SEQ SVG-RAMUS-OM1 SVG-DIAG SVG- RCA  EVH  70 MINUTES  SURGEON:  Surgeon(s) and Role:    * Melrose Nakayama, MD - Primary  PHYSICIAN ASSISTANT: WAYNE GOLD PA-C  ASSISTANTS: STAFF   ANESTHESIA:   general  EBL:  1246 mL   BLOOD ADMINISTERED:none  DRAINS: LEFT PLEURAL AND MEDIASTINAL CHEST TUBE   LOCAL MEDICATIONS USED:  NONE  SPECIMEN:  No Specimen  DISPOSITION OF SPECIMEN:  N/A  COUNTS:  YES  TOURNIQUET:  * No tourniquets in log *  DICTATION: .Other Dictation: Dictation Number PENDING  PLAN OF CARE: Admit to inpatient   PATIENT DISPOSITION:  ICU - intubated and hemodynamically stable.   Delay start of Pharmacological VTE agent (>24hrs) due to surgical blood loss or risk of bleeding: yes  COMPLICATIONS: NO KNOWN  Findings: LAD intramyocardial.  RCA small fair quality.  Remaining targets good quality.  Good quality conduits.  Cross-clamp 106 minutes, cardiopulmonary bypass 171 minutes

## 2020-07-10 NOTE — Anesthesia Procedure Notes (Signed)
Central Venous Catheter Insertion Performed by: Nolon Nations, MD, anesthesiologist Start/End3/30/2022 7:00 AM, 07/10/2020 7:15 AM Patient location: Pre-op. Preanesthetic checklist: patient identified, IV checked, site marked, risks and benefits discussed, surgical consent, monitors and equipment checked, pre-op evaluation, timeout performed and anesthesia consent Hand hygiene performed  and maximum sterile barriers used  PA cath was placed.Swan type:thermodilution PA Cath depth:40 Procedure performed without using ultrasound guided technique. Attempts: 1 Post procedure assessment: free fluid flow and no air  Patient tolerated the procedure well with no immediate complications.

## 2020-07-10 NOTE — Anesthesia Procedure Notes (Signed)
Central Venous Catheter Insertion Performed by: Nolon Nations, MD, anesthesiologist Start/End3/30/2022 6:50 AM, 07/10/2020 7:15 AM Patient location: Pre-op. Preanesthetic checklist: patient identified, IV checked, site marked, risks and benefits discussed, surgical consent, monitors and equipment checked, pre-op evaluation, timeout performed and anesthesia consent Position: Trendelenburg Lidocaine 1% used for infiltration and patient sedated Hand hygiene performed  and maximum sterile barriers used  Catheter size: 8.5 Fr Sheath introducer PA Cath depth:40 Procedure performed using ultrasound guided technique. Ultrasound Notes:anatomy identified, needle tip was noted to be adjacent to the nerve/plexus identified, no ultrasound evidence of intravascular and/or intraneural injection and image(s) printed for medical record Attempts: 1 Following insertion, line sutured, dressing applied and Biopatch. Post procedure assessment: blood return through all ports, free fluid flow and no air  Patient tolerated the procedure well with no immediate complications.

## 2020-07-10 NOTE — Anesthesia Procedure Notes (Signed)
Procedure Name: Intubation Date/Time: 07/10/2020 7:45 AM Performed by: Leonor Liv, CRNA Pre-anesthesia Checklist: Patient identified, Emergency Drugs available, Suction available and Patient being monitored Patient Re-evaluated:Patient Re-evaluated prior to induction Oxygen Delivery Method: Circle System Utilized Preoxygenation: Pre-oxygenation with 100% oxygen Induction Type: IV induction Ventilation: Mask ventilation without difficulty and Oral airway inserted - appropriate to patient size Laryngoscope Size: Glidescope and 3 Grade View: Grade I Tube type: Oral Tube size: 8.0 mm Number of attempts: 2 (DLx1 grade III MAC 3) Airway Equipment and Method: Stylet and Oral airway Placement Confirmation: ETT inserted through vocal cords under direct vision,  positive ETCO2 and breath sounds checked- equal and bilateral Secured at: 22 cm Tube secured with: Tape Dental Injury: Teeth and Oropharynx as per pre-operative assessment  Difficulty Due To: Difficult Airway- due to anterior larynx

## 2020-07-10 NOTE — Interval H&P Note (Signed)
History and Physical Interval Note:  07/10/2020 7:24 AM  [name]  has presented today for surgery, with the diagnosis of CAD.  The various methods of treatment have been discussed with the patient and family. After consideration of risks, benefits and other options for treatment, the patient has consented to  Procedure(s): CORONARY ARTERY BYPASS GRAFTING (CABG)x3. LEFT INTERNAL MAMMARY ARTERY. RIGHT SAPHENOUS VEIN HARVESTING. LEFT ATRIAL APPENDAGE CLIPPING. (N/A) CLIPPING OF ATRIAL APPENDAGE (N/A) TRANSESOPHAGEAL ECHOCARDIOGRAM (TEE) (N/A) as a surgical intervention.  The patient's history has been reviewed, patient examined, no change in status, stable for surgery.  I have reviewed the patient's chart and labs.  Questions were answered to the patient's satisfaction.     Melrose Nakayama

## 2020-07-11 ENCOUNTER — Encounter (HOSPITAL_COMMUNITY): Payer: Self-pay | Admitting: Thoracic Surgery (Cardiothoracic Vascular Surgery)

## 2020-07-11 ENCOUNTER — Inpatient Hospital Stay (HOSPITAL_COMMUNITY): Payer: PPO

## 2020-07-11 LAB — GLUCOSE, CAPILLARY
Glucose-Capillary: 113 mg/dL — ABNORMAL HIGH (ref 70–99)
Glucose-Capillary: 118 mg/dL — ABNORMAL HIGH (ref 70–99)
Glucose-Capillary: 123 mg/dL — ABNORMAL HIGH (ref 70–99)
Glucose-Capillary: 124 mg/dL — ABNORMAL HIGH (ref 70–99)
Glucose-Capillary: 128 mg/dL — ABNORMAL HIGH (ref 70–99)
Glucose-Capillary: 130 mg/dL — ABNORMAL HIGH (ref 70–99)
Glucose-Capillary: 130 mg/dL — ABNORMAL HIGH (ref 70–99)
Glucose-Capillary: 133 mg/dL — ABNORMAL HIGH (ref 70–99)
Glucose-Capillary: 134 mg/dL — ABNORMAL HIGH (ref 70–99)

## 2020-07-11 LAB — CBC
HCT: 30.9 % — ABNORMAL LOW (ref 36.0–46.0)
HCT: 30.5 % — ABNORMAL LOW (ref 36.0–46.0)
Hemoglobin: 10.2 g/dL — ABNORMAL LOW (ref 12.0–15.0)
Hemoglobin: 10 g/dL — ABNORMAL LOW (ref 12.0–15.0)
MCH: 30.1 pg (ref 26.0–34.0)
MCH: 30.4 pg (ref 26.0–34.0)
MCHC: 33 g/dL (ref 30.0–36.0)
MCHC: 32.8 g/dL (ref 30.0–36.0)
MCV: 91.2 fL (ref 80.0–100.0)
MCV: 92.7 fL (ref 80.0–100.0)
Platelets: 87 10*3/uL — ABNORMAL LOW (ref 150–400)
Platelets: 91 10*3/uL — ABNORMAL LOW (ref 150–400)
RBC: 3.39 MIL/uL — ABNORMAL LOW (ref 3.87–5.11)
RBC: 3.29 MIL/uL — ABNORMAL LOW (ref 3.87–5.11)
RDW: 14.6 % (ref 11.5–15.5)
RDW: 14.8 % (ref 11.5–15.5)
WBC: 12.4 10*3/uL — ABNORMAL HIGH (ref 4.0–10.5)
WBC: 14.4 10*3/uL — ABNORMAL HIGH (ref 4.0–10.5)
nRBC: 0 % (ref 0.0–0.2)
nRBC: 0 % (ref 0.0–0.2)

## 2020-07-11 LAB — BASIC METABOLIC PANEL
Anion gap: 6 (ref 5–15)
Anion gap: 5 (ref 5–15)
BUN: 19 mg/dL (ref 8–23)
BUN: 18 mg/dL (ref 8–23)
CO2: 24 mmol/L (ref 22–32)
CO2: 27 mmol/L (ref 22–32)
Calcium: 8.1 mg/dL — ABNORMAL LOW (ref 8.9–10.3)
Calcium: 8.1 mg/dL — ABNORMAL LOW (ref 8.9–10.3)
Chloride: 106 mmol/L (ref 98–111)
Chloride: 99 mmol/L (ref 98–111)
Creatinine, Ser: 0.93 mg/dL (ref 0.44–1.00)
Creatinine, Ser: 1.03 mg/dL — ABNORMAL HIGH (ref 0.44–1.00)
GFR, Estimated: >60 mL/min (ref >60)
GFR, Estimated: 58 mL/min — ABNORMAL LOW (ref >60)
Glucose, Bld: 132 mg/dL — ABNORMAL HIGH (ref 70–99)
Glucose, Bld: 125 mg/dL — ABNORMAL HIGH (ref 70–99)
Potassium: 4.1 mmol/L (ref 3.5–5.1)
Potassium: 3.7 mmol/L (ref 3.5–5.1)
Sodium: 136 mmol/L (ref 135–145)
Sodium: 131 mmol/L — ABNORMAL LOW (ref 135–145)

## 2020-07-11 LAB — POCT I-STAT 7, (LYTES, BLD GAS, ICA,H+H)
Acid-base deficit: 2 mmol/L (ref 0.0–2.0)
Acid-base deficit: 2 mmol/L (ref 0.0–2.0)
Acid-base deficit: 3 mmol/L — ABNORMAL HIGH (ref 0.0–2.0)
Acid-base deficit: 1 mmol/L (ref 0.0–2.0)
Bicarbonate: 22.5 mmol/L (ref 20.0–28.0)
Bicarbonate: 23.3 mmol/L (ref 20.0–28.0)
Bicarbonate: 23.5 mmol/L (ref 20.0–28.0)
Bicarbonate: 24.3 mmol/L (ref 20.0–28.0)
Calcium, Ion: 1.19 mmol/L (ref 1.15–1.40)
Calcium, Ion: 1.2 mmol/L (ref 1.15–1.40)
Calcium, Ion: 1.21 mmol/L (ref 1.15–1.40)
Calcium, Ion: 1.22 mmol/L (ref 1.15–1.40)
HCT: 28 % — ABNORMAL LOW (ref 36.0–46.0)
HCT: 29 % — ABNORMAL LOW (ref 36.0–46.0)
HCT: 29 % — ABNORMAL LOW (ref 36.0–46.0)
HCT: 29 % — ABNORMAL LOW (ref 36.0–46.0)
Hemoglobin: 9.5 g/dL — ABNORMAL LOW (ref 12.0–15.0)
Hemoglobin: 9.9 g/dL — ABNORMAL LOW (ref 12.0–15.0)
Hemoglobin: 9.9 g/dL — ABNORMAL LOW (ref 12.0–15.0)
Hemoglobin: 9.9 g/dL — ABNORMAL LOW (ref 12.0–15.0)
O2 Saturation: 100 %
O2 Saturation: 99 %
O2 Saturation: 99 %
O2 Saturation: 99 %
Patient temperature: 36.1
Patient temperature: 37
Patient temperature: 37.1
Patient temperature: 37
Potassium: 4.3 mmol/L (ref 3.5–5.1)
Potassium: 4.4 mmol/L (ref 3.5–5.1)
Potassium: 4.4 mmol/L (ref 3.5–5.1)
Potassium: 4.2 mmol/L (ref 3.5–5.1)
Sodium: 138 mmol/L (ref 135–145)
Sodium: 138 mmol/L (ref 135–145)
Sodium: 139 mmol/L (ref 135–145)
Sodium: 138 mmol/L (ref 135–145)
TCO2: 24 mmol/L (ref 22–32)
TCO2: 25 mmol/L (ref 22–32)
TCO2: 25 mmol/L (ref 22–32)
TCO2: 26 mmol/L (ref 22–32)
pCO2 arterial: 37.8 mmHg (ref 32.0–48.0)
pCO2 arterial: 41.4 mmHg (ref 32.0–48.0)
pCO2 arterial: 42.2 mmHg (ref 32.0–48.0)
pCO2 arterial: 43.5 mmHg (ref 32.0–48.0)
pH, Arterial: 7.35 (ref 7.350–7.450)
pH, Arterial: 7.362 (ref 7.350–7.450)
pH, Arterial: 7.378 (ref 7.350–7.450)
pH, Arterial: 7.354 (ref 7.350–7.450)
pO2, Arterial: 125 mmHg — ABNORMAL HIGH (ref 83.0–108.0)
pO2, Arterial: 137 mmHg — ABNORMAL HIGH (ref 83.0–108.0)
pO2, Arterial: 175 mmHg — ABNORMAL HIGH (ref 83.0–108.0)
pO2, Arterial: 160 mmHg — ABNORMAL HIGH (ref 83.0–108.0)

## 2020-07-11 LAB — MAGNESIUM
Magnesium: 2.5 mg/dL — ABNORMAL HIGH (ref 1.7–2.4)
Magnesium: 2.2 mg/dL (ref 1.7–2.4)

## 2020-07-11 MED ORDER — ALBUMIN HUMAN 5 % IV SOLN
12.5000 g | Freq: Once | INTRAVENOUS | Status: AC
  Administered 2020-07-11: 12.5 g via INTRAVENOUS
  Filled 2020-07-11: qty 250

## 2020-07-11 MED ORDER — SODIUM CHLORIDE 0.9% FLUSH: 10.0000 mL | INTRAVENOUS | Status: DC | PRN

## 2020-07-11 MED ORDER — INSULIN ASPART 100 UNIT/ML ~~LOC~~ SOLN
0.0000 [IU] | SUBCUTANEOUS | Status: DC
  Administered 2020-07-11 (×2): 2 [IU] via SUBCUTANEOUS

## 2020-07-11 MED ORDER — FUROSEMIDE 10 MG/ML IJ SOLN
40.0000 mg | Freq: Once | INTRAMUSCULAR | Status: AC
  Administered 2020-07-11: 40 mg via INTRAVENOUS
  Filled 2020-07-11: qty 4

## 2020-07-11 MED ORDER — ASPIRIN EC 81 MG PO TBEC
81.0000 mg | DELAYED_RELEASE_TABLET | Freq: Every day | ORAL | Status: DC
  Administered 2020-07-11 – 2020-07-16 (×6): 81 mg via ORAL
  Filled 2020-07-11 (×6): qty 1

## 2020-07-11 MED ORDER — ORAL CARE MOUTH RINSE
15.0000 mL | Freq: Two times a day (BID) | OROMUCOSAL | Status: DC
  Administered 2020-07-11 – 2020-07-16 (×9): 15 mL via OROMUCOSAL

## 2020-07-11 MED ORDER — INSULIN DETEMIR 100 UNIT/ML ~~LOC~~ SOLN
12.0000 [IU] | Freq: Two times a day (BID) | SUBCUTANEOUS | Status: DC
  Administered 2020-07-11 (×2): 12 [IU] via SUBCUTANEOUS
  Filled 2020-07-11 (×5): qty 0.12

## 2020-07-11 MED ORDER — SODIUM CHLORIDE 0.9% FLUSH
10.0000 mL | Freq: Two times a day (BID) | INTRAVENOUS | Status: DC
  Administered 2020-07-12: 10 mL

## 2020-07-11 MED ORDER — POTASSIUM CHLORIDE CRYS ER 20 MEQ PO TBCR
20.0000 meq | EXTENDED_RELEASE_TABLET | ORAL | Status: AC
  Administered 2020-07-11 – 2020-07-12 (×3): 20 meq via ORAL
  Filled 2020-07-11 (×3): qty 1

## 2020-07-11 MED ORDER — INSULIN ASPART 100 UNIT/ML ~~LOC~~ SOLN: 0.0000 [IU] | Freq: Three times a day (TID) | SUBCUTANEOUS | Status: DC

## 2020-07-11 MED FILL — Heparin Sodium (Porcine) Inj 1000 Unit/ML: INTRAMUSCULAR | Qty: 30 | Status: AC

## 2020-07-11 MED FILL — Potassium Chloride Inj 2 mEq/ML: INTRAVENOUS | Qty: 40 | Status: AC

## 2020-07-11 MED FILL — Magnesium Sulfate Inj 50%: INTRAMUSCULAR | Qty: 10 | Status: AC

## 2020-07-11 NOTE — Op Note (Signed)
Vanessa [name], Vanessa [name] MEDICAL RECORD NO: [id number] ACCOUNT NO: [id number] DATE OF BIRTH: 1950/05/19 FACILITY: [facility] LOCATION: [facility] PHYSICIAN: Revonda Standard. Roxan Hockey, MD  Operative Report   DATE OF PROCEDURE: 07/10/2020   PREOPERATIVE DIAGNOSIS:  Three-vessel coronary artery disease and paroxysmal atrial fibrillation.  POSTOPERATIVE DIAGNOSIS:  Three-vessel coronary artery disease and paroxysmal atrial fibrillation.  PROCEDURE PERFORMED:  Median sternotomy, extracorporeal circulation, Coronary artery bypass grafting x5  Left internal mammary artery to LAD,  Saphenous vein graft to first diagonal,  Sequential saphenous vein graft to ramus intermedius and OM1,  Saphenous vein graft to distal right coronary Endoscopic vein harvest right leg, Left atrial appendage clip 35 mm Atricureflex-V.  SURGEON:  Dr. Modesto Charon.  ASSISTANT:  Jadene Pierini, PA-C.  ANESTHESIA:  General.  FINDINGS:  Transesophageal echocardiography showed preserved left ventricular function with no significant valvular pathology. LAD deeply intramyocardial.  Right coronary small, fair quality target.  Remaining coronaries good quality.  Good quality  Conduits.  Very small left atrial appendage.  INDICATIONS:  Vanessa [name] is a 70 year old woman with multiple cardiac risk factors and a history of paroxysmal atrial fibrillation.  She recently has been experiencing chest pain.  Cardiac CT showed multivessel disease and cardiac catheterization showed severe  3-vessel disease with preserved left ventricular function.  She was referred for coronary artery bypass grafting.  After discussion with Dr. Curt Bears and the patient, we also plan to do a left atrial appendage clip to decrease her stroke risk.  The indications, risks, benefits, and alternatives were discussed in detail with the patient.  She understood and accepted the risks and agreed to proceed.   OPERATIVE NOTE:  Vanessa [name] was brought to the preoperative  holding area on 07/10/2020.   Anesthesia placed a Swan-Ganz catheter and an arterial blood pressure monitoring line.  She was taken to the operating room, anesthetized and intubated.  Intravenous antibiotics were administered.  A Foley catheter was placed.  Transesophageal  echocardiography was performed by Dr. Lissa Hoard.  See his separately dictated note for full details, but key findings as noted above. The chest, abdomen and legs were prepped and draped in the usual sterile fashion.  A timeout was performed.  An incision was made in the medial aspect of the right leg at the level of the knee.  The greater saphenous vein was harvested from the upper calf to the groin endoscopically, simultaneously a median sternotomy was performed and  the left internal mammary artery was harvested using standard technique.  5000 units of heparin was administered before dividing the distal end of the mammary artery.  After harvesting the conduits, the remainder of the full heparin dose was administered.  After confirming adequate anticoagulation with ACT measurement the aorta was cannulated via concentric 2-0 Ethibond pledgeted pursestring sutures.  A dual stage  venous cannula was placed via a pursestring suture in the right atrial appendage.  Cardiopulmonary bypass was initiated.  Flows were maintained per protocol.  The patient was cooled to 32 degrees Celsius.  The coronary arteries were inspected.  The  anastomotic sites were chosen.  The conduits were inspected.  There was an area on the vein that was not acceptable for use and this area was resected and an end-to-end anastomosis was performed with running 7-0 Prolene sutures.  The vein was now usable.   This portion of the vein with the end-to-end repair was placed to the small right coronary.  A foam pad was placed in the pericardium to insulate the heart.  A temperature  probe was placed in the myocardial septum.  A cardioplegia cannula was placed in the  ascending aorta.  The aorta was cross clamped.  The left ventricle was emptied via the aortic root vent.  Cardiac arrest and was achieved with combination of cold antegrade blood cardioplegia and topical iced saline.  After achieving a complete diastolic arrest and septal  cooling to 8 degree Celsius with 1 liter of cardioplegia, the following distal anastomoses were performed.  First a reversed saphenous vein graft was placed end-to-side to the distal right coronary artery.  This was a 1.5 mm fair quality target.  The vein was anastomosed end-to-side with a running 7-0 Prolene suture.  There was good flow through the graft with  cardioplegia administration and there was good hemostasis.    Next, a reversed saphenous vein graft was placed sequentially to the ramus intermedius and obtuse marginal 1.  The ramus intermedius was a 1.5 mm vessel and OM1 was a 2 mm vessel.  Both were good  quality vessels at the site of the anastomosis.  The vein was of good quality.  A side-to-side anastomosis was done to the ramus and end-to-side to OM1, both were done with running 7-0 Prolene sutures.  Both probed easily proximally and distally prior to  tying the sutures.  There was good flow and good hemostasis with cardioplegia administration.  Additional cardioplegia was administered via the vein grafts as well as the aortic root.  The first diagonal was exposed, it had disease proximally, but was a good quality vessel more distally.  It was 1.5 mm vessel at the site of the anastomosis.  The  vein graft was of good quality.  The end-to-side anastomosis was performed with a running 7-0 Prolene suture.  There was good flow and good hemostasis.  The left internal mammary artery was intramyocardial until very distal on the heart where it came back to the epicardial surface of the heart and was very tortuous.  It was dissected out in the midportion and was a 1.5 mm good quality target at the site  where it was  identified.  The distal end of the left mammary artery was bevelled.  It was then anastomosed end-to-side to the LAD with a running 8-0 Prolene suture.  At the completion of the mammary to LAD anastomosis, the bulldog clamp was briefly  removed to inspect for hemostasis, septal rewarming was noted.  The bulldog clamp was replaced.  There was a small branch of the mammary, which required an 8-0 Prolene suture and there was a small dog ear at the anastomosis which required a suture as well.  The  mammary pedicle was tacked to the epicardial surface of the heart with 6-0 Prolene sutures.  Additional cardioplegia was administered.  The vein grafts were cut to length.  There was only room on the ascending aorta for 2 proximals.  The ramus/ OM graft and the right coronary graft were placed end-to-side to 4.5 mm punch aortotomies with  running 6-0 Prolene sutures.  On completion of the second proximal anastomosis the patient was placed in Trendelenburg position.  Lidocaine was administered.  The aortic root was de-aired and the aortic crossclamp was removed.  The total crossclamp time  was 106 minutes.  The patient required two defibrillations with 20 joules and then was in a bradycardic rhythm thereafter.  The left atrial clip was placed on the left atrial appendage prior to doing the final proximal anastomoses.  The left atrial appendage was small and  sized for a 35 mm AtriCure Flex-V clip, which was placed at the base of the appendage.  Bulldog clamps were placed proximally and distally on the sequential vein and a venotomy was made.  The proximal anastomosis for  the diagonal vein was placed end-to-side to the sequential vein with a running 7-0 Prolene suture.    While rewarming all proximal and distal anastomoses were inspected for hemostasis.  While inspecting for hemostasis there was bleeding noted around the area of the mammary anastomosis.  This was inspected and there was a leak laterally, which was   repaired with a single 8-0 Prolene suture.  There was some minor oozing from the myocardium.  Epicardial pacing wires were placed on the right ventricle and right atrium.  When the patient had rewarmed to a core temperature of 37 degrees Celsius, she was  weaned from cardiopulmonary bypass on the first attempt.  She did not require inotropic support.  Total bypass time was 171 minutes.  The initial cardiac index was greater than 2 liters per minute per meter squared.  She remained hemodynamically stable  throughout the post-bypass period.  Post-bypass transesophageal echocardiography was essentially unchanged from the prebypass study.  A test dose of protamine was administered and was well tolerated.  The atrial and aortic cannula were removed.  The remainder of the protamine was administered without incident.  The chest was irrigated with warm saline.  Hemostasis was achieved.  Left  pleural and mediastinal chest tubes were placed through separate subcostal incisions.  The pericardium was reapproximated over the aortic root with interrupted 3-0 silk sutures.  The sternum was closed with a combination of single and double heavy gauge  stainless steel wires.  The pectoralis fascia, subcutaneous tissue and skin were closed in standard fashion.  All sponge, needle and instrument counts were correct at the end of the procedure, the patient was taken from the operating room to the  surgical intensive care unit, intubated and in good condition.   SUJ D: 07/10/2020 5:59:03 pm T: 07/11/2020 4:22:00 am  JOB: [id number]/ [id number]

## 2020-07-11 NOTE — Hospital Course (Addendum)
  HPI:at time of surgical consultation   Vanessa [name] is sent for consultation regarding three-vessel coronary disease.   Vanessa [name] is a 70 year old woman with a history of hypertension, hyperlipidemia, paroxysmal atrial fibrillation, arthritis, glaucoma, anxiety, and depression.  Her cardiac history dates back to 2013 when she was having palpitations and back neck and jaw discomfort.  A stress Myoview was negative.  She was found to have paroxysmal atrial fibrillation.  She has been on Eliquis since then.  She was on flecainide for a while but that was stopped in 2019.  In 2020 a Linq monitor showed about 1% atrial fibrillation.  Most of her symptoms seem to be related to frequent PACs.   She recently had a follow-up appointment with Dr. Curt Bears.  She complains of some chest discomfort which is a sensation under the left side of her breast that sometimes goes through to her back.  She is also had some episodes where she had pain in her neck and jaw.  She has had a couple of episodes where she had diaphoresis associated with this pain.  A cardiac CT was done which showed multivessel disease.  She then then underwent cardiac catheterization which showed severe three-vessel coronary disease with normal left ventricular function.  The patient and all pertinent studies were reviewed by Dr. Roxan Hockey who recommended proceeding with coronary artery surgical revascularization.   Hospital course:  The patient was admitted electively and on 07/10/2020 taken to the operating room at which time she underwent CABG x5 by Dr. Roxan Hockey.  Additionally she had a left atrial appendage clip placed.  She tolerated the procedure well and was taken to the surgical intensive care unit in stable condition.  Postoperative hospital course:  The patient was weaned from the ventilator using standard protocols without difficulty.  She has remained neurologically intact.  All routine lines, monitors and drainage devices  have been discontinued in the standard fashion.  She does have an expected acute blood loss anemia which is being followed clinically.  She has a thrombocytopenia as well with no evidence of active bleeding.  We are monitoring that as well.  She is not being given Lovenox for DVT prophylaxis but is using SCDs at this time.  Her creatinine is noted to be normal.  Blood sugars have been under good control.  She is maintaining sinus rhythm.  She was transferred to the 4 E. telemetry unit on postoperative day #2.  The remains in a sinus rhythm but rate is in the 60s so beta-blockers on hold.  We plan to resume Eliquis when pacing wires are out.  She is improving in regards to her pulmonary toilet and mobilization.  Her blood pressure has been somewhat on the low relative to systolic in the 606T so we have not resumed her ARB.  She has required diuresis for volume overload but this is showing steady improvement.  She has a thrombocytopenia which is stable.  Creatinine also increased a little bit to 1.23 and given these values will be checked at least 1 more time prior to discharge.

## 2020-07-11 NOTE — Progress Notes (Signed)
1 Day Post-Op Procedure(s) (LRB): CORONARY ARTERY BYPASS GRAFTING (CABG)x5. LEFT INTERNAL MAMMARY ARTERY. RIGHT SAPHENOUS VEIN HARVESTING. LEFT ATRIAL APPENDAGE CLIPPING. (N/A) CLIPPING OF ATRIAL APPENDAGE (N/A) TRANSESOPHAGEAL ECHOCARDIOGRAM (TEE) (N/A) Subjective: C/o incisional pain  Objective: Vital signs in last 24 hours: Temp:  [96.44 F (35.8 C)-99.1 F (37.3 C)] 99.1 F (37.3 C) (03/31 0700) Pulse Rate:  [73-89] 74 (03/31 0645) Cardiac Rhythm: Normal sinus rhythm;Atrial paced (03/31 0400) Resp:  [11-24] 14 (03/31 0700) BP: (90-127)/(49-99) 110/49 (03/31 0700) SpO2:  [96 %-100 %] 100 % (03/31 0645) Arterial Line BP: (107-149)/(46-73) 134/58 (03/31 0700) FiO2 (%):  [40 %-50 %] 40 % (03/30 2352) Weight:  [88.8 kg] 88.8 kg (03/31 0500)  Hemodynamic parameters for last 24 hours: PAP: (21-34)/(5-19) 30/18 CO:  [3.1 L/min-4.9 L/min] 4.9 L/min CI:  [1.5 L/min/m2-2.5 L/min/m2] 2.5 L/min/m2  Intake/Output from previous day: 03/30 0701 - 03/31 0700 In: 4751.2 [I.V.:3260.9; Blood:540; IV Piggyback:950.3] Out: 7989 [Urine:1530; Blood:1246; Chest Tube:380] Intake/Output this shift: No intake/output data recorded.  General appearance: alert, cooperative and no distress Neurologic: intact Heart: regular rate and rhythm Lungs: diminished breath sounds bibasilar Abdomen: normal findings: soft, non-tender  Lab Results: Recent Labs    07/10/20 2030 07/10/20 2335 07/11/20 0151 07/11/20 0345  WBC 12.1*  --   --  12.4*  HGB 10.2*   < > 9.9* 10.2*  HCT 30.3*   < > 29.0* 30.9*  PLT 84*  --   --  87*   < > = values in this interval not displayed.   BMET:  Recent Labs    07/10/20 2030 07/10/20 2335 07/11/20 0151 07/11/20 0345  NA 137   < > 138 136  K 4.5   < > 4.2 4.1  CL 109  --   --  106  CO2 24  --   --  24  GLUCOSE 123*  --   --  132*  BUN 20  --   --  19  CREATININE 1.20*  --   --  0.93  CALCIUM 8.1*  --   --  8.1*   < > = values in this interval not displayed.     PT/INR:  Recent Labs    07/10/20 1421  LABPROT 17.0*  INR 1.4*   ABG    Component Value Date/Time   PHART 7.354 07/11/2020 0151   HCO3 24.3 07/11/2020 0151   TCO2 26 07/11/2020 0151   ACIDBASEDEF 1.0 07/11/2020 0151   O2SAT 99.0 07/11/2020 0151   CBG (last 3)  Recent Labs    07/11/20 0154 07/11/20 0516 07/11/20 0710  GLUCAP 123* 130* 130*    Assessment/Plan: S/P Procedure(s) (LRB): CORONARY ARTERY BYPASS GRAFTING (CABG)x5. LEFT INTERNAL MAMMARY ARTERY. RIGHT SAPHENOUS VEIN HARVESTING. LEFT ATRIAL APPENDAGE CLIPPING. (N/A) CLIPPING OF ATRIAL APPENDAGE (N/A) TRANSESOPHAGEAL ECHOCARDIOGRAM (TEE) (N/A) -NEURO- intact CV- in SR, good CO- dc Swan, A line  ASA, rosuvastatin, metoprolol  Restart Eliquis prior to DC RESP- bibasilar atelectasis- IS, flutter  Small R apical pneumothorax- was probably on initial postop film in retrospect. No air leak form tubes  RENAL- creatinine normal - diurese  ENDO- CBG well controlled, transition to levemir + SSI  Anemia secondary to ABL- follow  Thrombocytopenia- no active bleeding. Monitor  No heparin, SCD for DVT prophylaxis  DC chest tubes  Ambulate    LOS: 1 day    Melrose Nakayama 07/11/2020

## 2020-07-11 NOTE — Progress Notes (Signed)
TCTS Evening Rounds POD #1 s/p cABG Tubes out/ continues pacing Mild residual incisional pain BP (!) 111/48   Pulse 84   Temp 98.6 F (37 C) (Oral)   Resp 12   Ht 5\' 8"  (1.727 m)   Wt 88.8 kg   SpO2 94%   BMI 29.77 kg/m  Alert/oriented CTA RRR Incisional dressing dry   Intake/Output Summary (Last 24 hours) at 07/11/2020 1814 Last data filed at 07/11/2020 1800 Gross per 24 hour  Intake 2170.05 ml  Output 3065 ml  Net -894.95 ml    A/p: continue present management. Nicholas Ossa Z. Orvan Seen, [place]

## 2020-07-11 NOTE — Plan of Care (Signed)
  Problem: Education: Goal: Knowledge of General Education information will improve Description: Including pain rating scale, medication(s)/side effects and non-pharmacologic comfort measures Outcome: Progressing   Problem: Health Behavior/Discharge Planning: Goal: Ability to manage health-related needs will improve Outcome: Progressing   Problem: Clinical Measurements: Goal: Ability to maintain clinical measurements within normal limits will improve Outcome: Progressing Goal: Will remain free from infection Outcome: Progressing Goal: Diagnostic test results will improve Outcome: Progressing Goal: Respiratory complications will improve Outcome: Progressing Goal: Cardiovascular complication will be avoided Outcome: Progressing   Problem: Activity: Goal: Risk for activity intolerance will decrease Outcome: Progressing   Problem: Nutrition: Goal: Adequate nutrition will be maintained Outcome: Progressing   Problem: Coping: Goal: Level of anxiety will decrease Outcome: Progressing   Problem: Elimination: Goal: Will not experience complications related to bowel motility Outcome: Progressing Goal: Will not experience complications related to urinary retention Outcome: Progressing   Problem: Pain Managment: Goal: General experience of comfort will improve Outcome: Progressing   Problem: Safety: Goal: Ability to remain free from injury will improve Outcome: Progressing   Problem: Skin Integrity: Goal: Risk for impaired skin integrity will decrease Outcome: Progressing   Problem: Education: Goal: Will demonstrate proper wound care and an understanding of methods to prevent future damage Outcome: Progressing Goal: Knowledge of disease or condition will improve Outcome: Progressing Goal: Knowledge of the prescribed therapeutic regimen will improve Outcome: Progressing   Problem: Activity: Goal: Risk for activity intolerance will decrease Outcome: Progressing    Problem: Cardiac: Goal: Will achieve and/or maintain hemodynamic stability Outcome: Progressing   Problem: Clinical Measurements: Goal: Postoperative complications will be avoided or minimized Outcome: Progressing   Problem: Respiratory: Goal: Respiratory status will improve Outcome: Progressing   Problem: Skin Integrity: Goal: Wound healing without signs and symptoms of infection Outcome: Progressing Goal: Risk for impaired skin integrity will decrease Outcome: Progressing   Problem: Urinary Elimination: Goal: Ability to achieve and maintain adequate renal perfusion and functioning will improve Outcome: Progressing

## 2020-07-11 NOTE — Discharge Summary (Addendum)
Physician Discharge Summary  Patient ID: Vanessa [name] MRN: [id number] DOB/AGE: 05-27-1950 70 y.o.  Admit date: 07/10/2020 Discharge date: 07/16/2020  Admission Diagnoses: Coronary artery disease  Discharge Diagnoses:  Active Problems:   S/P CABG x 5 Expected post op blood loss anemia  Patient Active Problem List   Diagnosis Date Noted  . S/P CABG x 5 07/10/2020  . CAD (coronary artery disease) 07/04/2020  . Abnormal cardiac CT angiography 07/01/2020  . Pain of right thigh 02/04/2018  . Low back pain 02/04/2018  . Pain of joint of left ankle and foot 01/19/2018  . Ankle pain 01/19/2018  . Foot pain 01/19/2018  . Family history of premature CAD 08/05/2017  . Atrial fibrillation ([place]) 07/19/2017  . GERD without esophagitis 07/19/2017  . Mixed dyslipidemia 07/19/2017  . SVT (supraventricular tachycardia) ([place]) 11/09/2016  . Left knee pain 11/01/2014  . Primary osteoarthritis of left knee 11/01/2014  . Chest pain 12/03/2011  . HTN (hypertension) 12/03/2011  . Hyperlipidemia 12/03/2011   Discharged Condition: stable  Hospital Course:  As done by Jadene Pierini PA-C: The patient was weaned from the ventilator using standard protocols without difficulty.  She has remained neurologically intact.  All routine lines, monitors and drainage devices have been discontinued in the standard fashion.  She does have an expected acute blood loss anemia which is being followed clinically.  She has a thrombocytopenia as well with no evidence of active bleeding.  We are monitoring that as well.  She is not being given Lovenox for DVT prophylaxis but is using SCDs at this time.  Her creatinine is noted to be normal.  Blood sugars have been under good control.  She is maintaining sinus rhythm.  She was transferred to the 4 E. telemetry unit on postoperative day #2.  The remains in a sinus rhythm but rate is in the 60s so beta-blockers on hold.  We plan to resume Eliquis when pacing wires are out.  She is  improving in regards to her pulmonary toilet and mobilization.  Her blood pressure has been somewhat on the low relative to systolic in the 552C so we have not resumed her ARB.  She has required diuresis for volume overload but this is showing steady improvement.  She has a thrombocytopenia which is stable.  Creatinine also increased a little bit to 1.23 and given these values will be checked at least 1 more time prior to discharge.  Addendum: Patient was restarted on Apixaban on 04/04 as EPW were removed on 04/03. She was also restarted on lower dose Cardizem CD (history of a fib) but will be resumed on dose as was on prior to surgery. She had a bowel movement on 04/04. Lab results from today showed creatinine decreased from 1.23 to 0.96. Her potassium was then supplemented as was 3.6. Her H and H on 04/04 was improved to 10.2 and 31.8. Her thrombocytopenia did resolve as her platelets were increased to 175,000. Cardiac rehab has recommended home PT and this has been ordered. Sternal and RLE wounds are clean, dry, and healing without signs of infection. As discussed with Dr. Roxan Hockey, she is felt surgically stable for discharge today.  Consults: None  Significant Diagnostic Studies:  Narrative & Impression  CLINICAL DATA:  Atelectasis.  EXAM: CHEST - 2 VIEW  COMPARISON:  07/12/2020  FINDINGS: Lungs are adequately inflated demonstrate stable bibasilar opacification compatible with moderate left effusion and small right effusion likely with associated bibasilar atelectasis. Infection in the lung bases is possible. Stable  borderline cardiomegaly. Remainder of the exam is unchanged.  IMPRESSION: Stable bibasilar opacification compatible with moderate left and small right effusions likely with associated bibasilar atelectasis. Infection in the lung bases is possible.   Electronically Signed   By: Marin Olp M.D.   On: 07/13/2020 08:40    Treatments:   Operative Report    DATE OF PROCEDURE: 07/10/2020   PREOPERATIVE DIAGNOSIS:  Three-vessel coronary artery disease and paroxysmal atrial fibrillation.  POSTOPERATIVE DIAGNOSIS:  Three-vessel coronary artery disease and paroxysmal atrial fibrillation.  PROCEDURE PERFORMED:  Median sternotomy, extracorporeal circulation, coronary artery bypass grafting x5 (left internal mammary artery to LAD, saphenous vein graft to first diagonal, sequential saphenous vein graft to ramus intermedius and OM1, saphenous  vein graft to distal right coronary), endoscopic vein harvest right leg, left atrial appendage clip 35 mm flex-V.  SURGEON:  Dr. Modesto Charon.  ASSISTANT:  Jadene Pierini PA-C  ANESTHESIA:  General.  Discharge Exam: Blood pressure (!) 153/72, pulse 90, temperature 98.9 F (37.2 C), temperature source Oral, resp. rate 18, height 5\' 8"  (1.727 m), weight 83.5 kg, SpO2 100 %.   Cardiovascular: RRR Pulmonary: Diminished bibasilar breath sounds L>R Abdomen: Soft, non tender, bowel sounds present. Extremities: Mild bilateral lower extremity edema. Wounds: Clean and dry.  No erythema or signs of infection.  Disposition:  Discharge disposition: 01-Home or Self Care       Discharge Instructions    Amb Referral to Cardiac Rehabilitation   Complete by: As directed    To [facility]   Diagnosis: CABG   CABG X ___: 5   After initial evaluation and assessments completed: Virtual Based Care may be provided alone or in conjunction with Phase 2 Cardiac Rehab based on patient barriers.: Yes     Allergies as of 07/16/2020      Reactions   Codeine Nausea Only   nasuea      Medication List    STOP taking these medications   ciprofloxacin 500 MG tablet Commonly known as: CIPRO   fluconazole 150 MG tablet Commonly known as: DIFLUCAN   isosorbide mononitrate 30 MG 24 hr tablet Commonly known as: IMDUR   nitroGLYCERIN 0.4 MG SL tablet Commonly known as: NITROSTAT    valsartan-hydrochlorothiazide 160-12.5 MG tablet Commonly known as: DIOVAN-HCT     TAKE these medications   Alive Once Daily Womens 50+ Tabs Take 1 tablet by mouth daily.   aspirin EC 81 MG tablet Take 1 tablet (81 mg total) by mouth daily. Swallow whole.   Blink Tears 0.25 % Soln Generic drug: Polyethylene Glycol 400 Place 1 drop into both eyes daily as needed (Dry eyes).   CALTRATE 600+D PO Take 600 mg by mouth daily.   clotrimazole-betamethasone cream Commonly known as: LOTRISONE Apply 1 application topically 2 (two) times daily as needed.   diltiazem 180 MG 24 hr capsule Commonly known as: CARDIZEM CD Take 1 capsule (180 mg total) by mouth daily.   Eliquis 5 MG Tabs tablet Generic drug: apixaban Take 1 tablet (5 mg total) by mouth 2 (two) times daily.   esomeprazole 40 MG capsule Commonly known as: NexIUM Take 1 capsule (40 mg total) by mouth daily.   furosemide 40 MG tablet Commonly known as: LASIX Take 1 tablet (40 mg total) by mouth daily. For 5 days then stop.   potassium chloride SA 20 MEQ tablet Commonly known as: KLOR-CON Take 1 tablet (20 mEq total) by mouth daily. For 5 days then stop.   rosuvastatin 10 MG tablet Commonly  known as: CRESTOR Take 1 tablet (10 mg total) by mouth daily.   traMADol 50 MG tablet Commonly known as: Ultram Take 1 tablet (50 mg total) by mouth every 6 (six) hours as needed for severe pain.   valsartan 80 MG tablet Commonly known as: Diovan Take 1 tablet (80 mg total) by mouth daily.   vitamin C 1000 MG tablet Take 1,000 mg by mouth daily.   Vitamin D3 50 MCG (2000 UT) Tabs Take 2,000 Units by mouth daily.   Zinc 50 MG Caps Take 50 mg by mouth daily.      The patient has been discharged on:   1.Beta Blocker:  Yes [   ]                              No   [ x  ]                              If No, reason:On Cardizem as has had history of a fib. Will consider possibly starting as outpatient  2.Ace  Inhibitor/ARB: Yes [ x  ]                                     No  [    ]                                     If No, reason:  3.Statin:   Yes [x   ]                  No  [   ]                  If No, reason:  4.Ecasa:  Yes  [ x  ]                  No   [   ]                  If No, reason:   Follow-up Information    Melrose Nakayama, MD Follow up.   Specialty: Cardiothoracic Surgery Why: See discharge paperwork for follow-up appointment with surgeon.  Obtain a chest x-ray at [facility] 1/2-hour prior to appointment.  It is located in the same office complex on the first floor. Contact information: [place] [address] [phone]        Nelva Bush, MD Follow up.   Specialty: Cardiology Why: Please see discharge paperwork for follow-up appointment with cardiology.  Also sign up for MY CHART TO KEEP TRACK OF ALL APPOINTMENTS. Contact information: [address] [place]  [address] [phone]              The patient has been discharged on:   1.Beta Blocker:  Yes [   ]                              No   [x   ]  If No, reason:Labile BP  2.Ace Inhibitor/ARB: Yes [   ]                                     No  [    ]                                     If No, reason:  3.Statin:   Yes [ x  ]                  No  [   ]                  If No, reason:  4.Ecasa:  Yes  [ x  ]                  No   [   ]                  If No, reason: Please note original discharge summary done by Jadene Pierini PA-C. I finalized discharge summary  Signed: Arnoldo Lenis 07/16/2020, 8:17 AM

## 2020-07-11 NOTE — Procedures (Signed)
Extubation Procedure Note  Patient Details:   Name: Vanessa [name] DOB: [birth year] MRN: [id number]   Airway Documentation:  Airway 8 mm (Active)  Secured at (cm) 21 cm 07/10/20 2332  Measured From Lips 07/10/20 2332  Secured Location Right 07/10/20 2332  Secured By Pink Tape 07/10/20 2332  Prone position No 07/10/20 2332  Cuff Pressure (cm H2O) Green OR 18-26 CmH2O 07/10/20 2332  Site Condition Dry 07/10/20 2332   07/11/20, 1240    Evaluation  O2 sats: stable throughout Complications: No apparent complications Patient did tolerate procedure well. Bilateral Breath Sounds: Clear,Diminished   Yes  Pt. Extubated to 4L [place], clear bilateral breath sounds, no stridor noted. Cuff leak noted. Pt. Able to say name, VC 545ml, nif -20. Pt. Is able to pull 694ml on IS. RT will continue to monitor  Gifford Shave 07/11/2020, 12:42 AM

## 2020-07-12 ENCOUNTER — Inpatient Hospital Stay (HOSPITAL_COMMUNITY): Payer: PPO

## 2020-07-12 LAB — BASIC METABOLIC PANEL
Anion gap: 4 — ABNORMAL LOW (ref 5–15)
BUN: 19 mg/dL (ref 8–23)
CO2: 30 mmol/L (ref 22–32)
Calcium: 8.3 mg/dL — ABNORMAL LOW (ref 8.9–10.3)
Chloride: 99 mmol/L (ref 98–111)
Creatinine, Ser: 0.92 mg/dL (ref 0.44–1.00)
GFR, Estimated: >60 mL/min (ref >60)
Glucose, Bld: 86 mg/dL (ref 70–99)
Potassium: 4.3 mmol/L (ref 3.5–5.1)
Sodium: 133 mmol/L — ABNORMAL LOW (ref 135–145)

## 2020-07-12 LAB — CBC
HCT: 30.1 % — ABNORMAL LOW (ref 36.0–46.0)
Hemoglobin: 9.7 g/dL — ABNORMAL LOW (ref 12.0–15.0)
MCH: 30.8 pg (ref 26.0–34.0)
MCHC: 32.2 g/dL (ref 30.0–36.0)
MCV: 95.6 fL (ref 80.0–100.0)
Platelets: 86 10*3/uL — ABNORMAL LOW (ref 150–400)
RBC: 3.15 MIL/uL — ABNORMAL LOW (ref 3.87–5.11)
RDW: 14.6 % (ref 11.5–15.5)
WBC: 13.2 10*3/uL — ABNORMAL HIGH (ref 4.0–10.5)
nRBC: 0 % (ref 0.0–0.2)

## 2020-07-12 LAB — GLUCOSE, CAPILLARY
Glucose-Capillary: 85 mg/dL (ref 70–99)
Glucose-Capillary: 113 mg/dL — ABNORMAL HIGH (ref 70–99)
Glucose-Capillary: 141 mg/dL — ABNORMAL HIGH (ref 70–99)
Glucose-Capillary: 126 mg/dL — ABNORMAL HIGH (ref 70–99)

## 2020-07-12 MED ORDER — ~~LOC~~ CARDIAC SURGERY, PATIENT & FAMILY EDUCATION: Freq: Once | Status: AC

## 2020-07-12 MED ORDER — MAGNESIUM HYDROXIDE 400 MG/5ML PO SUSP: 30.0000 mL | Freq: Every day | ORAL | Status: DC | PRN

## 2020-07-12 MED ORDER — ALUM & MAG HYDROXIDE-SIMETH 200-200-20 MG/5ML PO SUSP: 15.0000 mL | Freq: Four times a day (QID) | ORAL | Status: DC | PRN

## 2020-07-12 MED ORDER — FUROSEMIDE 40 MG PO TABS
40.0000 mg | ORAL_TABLET | Freq: Two times a day (BID) | ORAL | Status: DC
  Administered 2020-07-12 – 2020-07-13 (×4): 40 mg via ORAL
  Filled 2020-07-12 (×4): qty 1

## 2020-07-12 MED ORDER — METOCLOPRAMIDE HCL 5 MG/5ML PO SOLN
10.0000 mg | Freq: Three times a day (TID) | ORAL | Status: AC
  Administered 2020-07-12 – 2020-07-13 (×5): 10 mg via ORAL
  Filled 2020-07-12 (×8): qty 10

## 2020-07-12 MED ORDER — INSULIN ASPART 100 UNIT/ML ~~LOC~~ SOLN
0.0000 [IU] | Freq: Three times a day (TID) | SUBCUTANEOUS | Status: DC
  Administered 2020-07-12 – 2020-07-14 (×5): 2 [IU] via SUBCUTANEOUS

## 2020-07-12 MED ORDER — SODIUM CHLORIDE 0.9% FLUSH
3.0000 mL | Freq: Two times a day (BID) | INTRAVENOUS | Status: DC
  Administered 2020-07-12 – 2020-07-16 (×8): 3 mL via INTRAVENOUS

## 2020-07-12 MED ORDER — SODIUM CHLORIDE 0.9% FLUSH: 3.0000 mL | INTRAVENOUS | Status: DC | PRN

## 2020-07-12 MED ORDER — POTASSIUM CHLORIDE CRYS ER 20 MEQ PO TBCR
20.0000 meq | EXTENDED_RELEASE_TABLET | Freq: Two times a day (BID) | ORAL | Status: DC
  Administered 2020-07-12 – 2020-07-13 (×4): 20 meq via ORAL
  Filled 2020-07-12 (×4): qty 1

## 2020-07-12 MED ORDER — SODIUM CHLORIDE 0.9 % IV SOLN: 250.0000 mL | INTRAVENOUS | Status: DC | PRN

## 2020-07-12 NOTE — Progress Notes (Signed)
CARDIAC REHAB PHASE I   PRE:  Rate/Rhythm: 66 SR    BP: sitting 119/46    SaO2: 100 3L  MODE:  Ambulation: 120 ft   POST:  Rate/Rhythm: 80 SR    BP: sitting 127/52     SaO2: 97 2L  Pt feeling weak and sleepy on EOB after transfer. Stood with rocking and used RW and 2L, gait belt. Slow pace, small steps. After turning around pt seemed even weaker. C/o feeling hot. Sts she could keep going but had her to sit. Sts she felt weak. BP 109/72 after a few minutes (therefore probably lower initially). Rolled pt back to room. Stood and pivoted to bed. Feels tired and weak, falling asleep in bed. VSS.  Kiefer, ACSM 07/12/2020 11:54 AM

## 2020-07-12 NOTE — Progress Notes (Addendum)
2 Days Post-Op Procedure(s) (LRB): CORONARY ARTERY BYPASS GRAFTING (CABG)x5. LEFT INTERNAL MAMMARY ARTERY. RIGHT SAPHENOUS VEIN HARVESTING. LEFT ATRIAL APPENDAGE CLIPPING. (N/A) CLIPPING OF ATRIAL APPENDAGE (N/A) TRANSESOPHAGEAL ECHOCARDIOGRAM (TEE) (N/A) Subjective: C/o incisional pain. Nausea yesterday, better this AM  Objective: Vital signs in last 24 hours: Temp:  [97.8 F (36.6 C)-99.14 F (37.3 C)] 98.3 F (36.8 C) (04/01 0735) Pulse Rate:  [84-87] 85 (04/01 0600) Cardiac Rhythm: Atrial paced (04/01 0400) Resp:  [9-20] 11 (04/01 0600) BP: (87-139)/(48-67) 123/55 (04/01 0600) SpO2:  [85 %-100 %] 96 % (04/01 0600) Arterial Line BP: (134-143)/(49-53) 143/53 (03/31 0900) Weight:  [88.2 kg] 88.2 kg (04/01 0600)  Hemodynamic parameters for last 24 hours: PAP: (22-24)/(9-10) 24/10 CO:  [4.3 L/min] 4.3 L/min CI:  [2.2 L/min/m2] 2.2 L/min/m2  Intake/Output from previous day: 03/31 0701 - 04/01 0700 In: 1678.3 [P.O.:480; I.V.:748.4; IV Piggyback:449.9] Out: 2665 [Urine:2575; Chest Tube:90] Intake/Output this shift: No intake/output data recorded.  General appearance: alert, cooperative and mild distress Neurologic: intact Heart: regular rate and rhythm Lungs: diminished breath sounds bibasilar Abdomen: normal findings: soft, non-tender Distress= incisional pain  Lab Results: Recent Labs    07/11/20 1653 07/12/20 0445  WBC 14.4* 13.2*  HGB 10.0* 9.7*  HCT 30.5* 30.1*  PLT 91* 86*   BMET:  Recent Labs    07/11/20 1653 07/12/20 0445  NA 131* 133*  K 3.7 4.3  CL 99 99  CO2 27 30  GLUCOSE 125* 86  BUN 18 19  CREATININE 1.03* 0.92  CALCIUM 8.1* 8.3*    PT/INR:  Recent Labs    07/10/20 1421  LABPROT 17.0*  INR 1.4*   ABG    Component Value Date/Time   PHART 7.354 07/11/2020 0151   HCO3 24.3 07/11/2020 0151   TCO2 26 07/11/2020 0151   ACIDBASEDEF 1.0 07/11/2020 0151   O2SAT 99.0 07/11/2020 0151   CBG (last 3)  Recent Labs    07/11/20 1613  07/11/20 2201 07/12/20 0654  GLUCAP 134* 113* 85    Assessment/Plan: S/P Procedure(s) (LRB): CORONARY ARTERY BYPASS GRAFTING (CABG)x5. LEFT INTERNAL MAMMARY ARTERY. RIGHT SAPHENOUS VEIN HARVESTING. LEFT ATRIAL APPENDAGE CLIPPING. (N/A) CLIPPING OF ATRIAL APPENDAGE (N/A) TRANSESOPHAGEAL ECHOCARDIOGRAM (TEE) (N/A) Plan for transfer to step-down: see transfer orders  CV- in SR at 60, BP stable  Stop beta blocker (has been held so far)  Resume diltiazem and Diovan when BP increases  Resume Eliquis when pacing wires out RESP- Atelectasis a little better- continue IS and flutter RENAL- creatinine and lytes OK  PO Lasix, dc [place] ENDO- CBG well controlled- dc levemir and change SSI to [facility] and HS Anemia secondary to ABL- stable Thrombocytopenia- stable, follow, no heparin GI- nausea yesterday Ambulate   LOS: 2 days    Melrose Nakayama 07/12/2020

## 2020-07-13 ENCOUNTER — Inpatient Hospital Stay (HOSPITAL_COMMUNITY): Payer: PPO

## 2020-07-13 LAB — CBC
HCT: 28.8 % — ABNORMAL LOW (ref 36.0–46.0)
Hemoglobin: 9.3 g/dL — ABNORMAL LOW (ref 12.0–15.0)
MCH: 30.8 pg (ref 26.0–34.0)
MCHC: 32.3 g/dL (ref 30.0–36.0)
MCV: 95.4 fL (ref 80.0–100.0)
Platelets: 87 10*3/uL — ABNORMAL LOW (ref 150–400)
RBC: 3.02 MIL/uL — ABNORMAL LOW (ref 3.87–5.11)
RDW: 14.5 % (ref 11.5–15.5)
WBC: 12.4 10*3/uL — ABNORMAL HIGH (ref 4.0–10.5)
nRBC: 0 % (ref 0.0–0.2)

## 2020-07-13 LAB — BASIC METABOLIC PANEL
Anion gap: 4 — ABNORMAL LOW (ref 5–15)
BUN: 25 mg/dL — ABNORMAL HIGH (ref 8–23)
CO2: 28 mmol/L (ref 22–32)
Calcium: 8.3 mg/dL — ABNORMAL LOW (ref 8.9–10.3)
Chloride: 100 mmol/L (ref 98–111)
Creatinine, Ser: 1.23 mg/dL — ABNORMAL HIGH (ref 0.44–1.00)
GFR, Estimated: 47 mL/min — ABNORMAL LOW (ref >60)
Glucose, Bld: 128 mg/dL — ABNORMAL HIGH (ref 70–99)
Potassium: 4.4 mmol/L (ref 3.5–5.1)
Sodium: 132 mmol/L — ABNORMAL LOW (ref 135–145)

## 2020-07-13 LAB — GLUCOSE, CAPILLARY
Glucose-Capillary: 103 mg/dL — ABNORMAL HIGH (ref 70–99)
Glucose-Capillary: 136 mg/dL — ABNORMAL HIGH (ref 70–99)
Glucose-Capillary: 131 mg/dL — ABNORMAL HIGH (ref 70–99)
Glucose-Capillary: 133 mg/dL — ABNORMAL HIGH (ref 70–99)

## 2020-07-13 NOTE — Progress Notes (Addendum)
[address]       [place] [address]             [phone]      3 Days Post-Op Procedure(s) (LRB): CORONARY ARTERY BYPASS GRAFTING (CABG)x5. LEFT INTERNAL MAMMARY ARTERY. RIGHT SAPHENOUS VEIN HARVESTING. LEFT ATRIAL APPENDAGE CLIPPING. (N/A) CLIPPING OF ATRIAL APPENDAGE (N/A) TRANSESOPHAGEAL ECHOCARDIOGRAM (TEE) (N/A) Subjective: Feels fair , says back abd belly hurt, no BM   Objective: Vital signs in last 24 hours: Temp:  [97.6 F (36.4 C)-99.2 F (37.3 C)] 99.2 F (37.3 C) (04/02 0744) Pulse Rate:  [58-76] 71 (04/02 0744) Cardiac Rhythm: Normal sinus rhythm (04/01 1901) Resp:  [10-20] 16 (04/02 0744) BP: (105-135)/(41-62) 133/41 (04/02 0744) SpO2:  [75 %-98 %] 94 % (04/02 0744) Weight:  [87.8 kg] 87.8 kg (04/02 0450)  Hemodynamic parameters for last 24 hours:    Intake/Output from previous day: 04/01 0701 - 04/02 0700 In: 3 [I.V.:3] Out: -  Intake/Output this shift: No intake/output data recorded.  General appearance: alert, cooperative, fatigued and no distress Heart: regular rate and rhythm Lungs: dim in bases Abdomen: soft, non tender or distended Extremities: trace edema Wound: incis healing well  Lab Results: Recent Labs    07/12/20 0445 07/13/20 0119  WBC 13.2* 12.4*  HGB 9.7* 9.3*  HCT 30.1* 28.8*  PLT 86* 87*   BMET:  Recent Labs    07/12/20 0445 07/13/20 0119  NA 133* 132*  K 4.3 4.4  CL 99 100  CO2 30 28  GLUCOSE 86 128*  BUN 19 25*  CREATININE 0.92 1.23*  CALCIUM 8.3* 8.3*    PT/INR:  Recent Labs    07/10/20 1421  LABPROT 17.0*  INR 1.4*   ABG    Component Value Date/Time   PHART 7.354 07/11/2020 0151   HCO3 24.3 07/11/2020 0151   TCO2 26 07/11/2020 0151   ACIDBASEDEF 1.0 07/11/2020 0151   O2SAT 99.0 07/11/2020 0151   CBG (last 3)  Recent Labs    07/12/20 1550 07/12/20 2040 07/13/20 0602  GLUCAP 141* 126* 103*    Meds Scheduled Meds: . acetaminophen  1,000 mg Oral Q6H   Or  .  acetaminophen (TYLENOL) oral liquid 160 mg/5 mL  1,000 mg Per Tube Q6H  . aspirin EC  81 mg Oral Daily  . bisacodyl  10 mg Oral Daily   Or  . bisacodyl  10 mg Rectal Daily  . Chlorhexidine Gluconate Cloth  6 each Topical Daily  . docusate sodium  200 mg Oral Daily  . furosemide  40 mg Oral BID  . insulin aspart  0-15 Units Subcutaneous TID WC  . mouth rinse  15 mL Mouth Rinse BID  . metoCLOPramide  10 mg Oral TID WC & HS  . pantoprazole  40 mg Oral Daily  . potassium chloride  20 mEq Oral BID  . rosuvastatin  10 mg Oral Daily  . sodium chloride flush  3 mL Intravenous Q12H   Continuous Infusions: . sodium chloride     PRN Meds:.sodium chloride, alum & mag hydroxide-simeth, magnesium hydroxide, metoprolol tartrate, ondansetron (ZOFRAN) IV, oxyCODONE, sodium chloride flush  Xrays DG Chest Port 1 View  Result Date: 07/12/2020 CLINICAL DATA:  Status post coronary artery bypass grafting. Swan-Ganz catheter removal EXAM: PORTABLE CHEST 1 VIEW COMPARISON:  July 11, 2020. FINDINGS: Swan-Ganz catheter has been removed. Chest tubes have been removed. Cordis tip is in the superior vena cava. Temporary pacemaker wires are attached to the  right heart. Trace right apical pneumothorax may be slightly smaller than 1 day prior. Small pleural effusions bilaterally with bibasilar atelectasis. No new opacity. Heart is mildly enlarged with pulmonary vascularity normal, stable. Patient is status post coronary bypass grafting. Left atrial appendage clamp present. No adenopathy. No bone lesions. IMPRESSION: Cordis tip in superior vena cava. Trace right apical pneumothorax. Pleural effusions bilaterally with bibasilar atelectasis, stable. Stable cardiac prominence. Postoperative changes noted. Electronically Signed   By: Lowella Grip III M.D.   On: 07/12/2020 08:08    Assessment/Plan: S/P Procedure(s) (LRB): CORONARY ARTERY BYPASS GRAFTING (CABG)x5. LEFT INTERNAL MAMMARY ARTERY. RIGHT SAPHENOUS VEIN  HARVESTING. LEFT ATRIAL APPENDAGE CLIPPING. (N/A) CLIPPING OF ATRIAL APPENDAGE (N/A) TRANSESOPHAGEAL ECHOCARDIOGRAM (TEE) (N/A)  1 afeb, VSS, SR/SB, BP ok- holding on preop anti=hypertensives and beta blocker 2 sats good on 2 liters 3 good UOP, weight still up about 5 kg if accurate- currently on BID lasix 4 H/H slightly lower, monitor clinically, conts to equalibrate, creat bumped a little- monitor closely 5 thrombocytopenia is stable 6 minor leukocytosis , trend improving 7 minor hyponatremia 8 BS well controlled- no DM hx 9 resume eliquis when pacer wires removed 10 CXR with small effusions/atx 11 Push rehab and pulm toilet as able, cont stool softeners, MOM    LOS: 3 days    John Giovanni PA-C Pager [phone] 07/13/2020 Patient seen and examined, agree with above Looks better today C/o incisional pain- only taking Tylenol so far, encouraged to use pain meds as needed Emphasized importance of ambulating and IS  Remo Lipps C. Roxan Hockey, MD [facility] [phone]

## 2020-07-13 NOTE — Progress Notes (Signed)
CARDIAC REHAB PHASE I   PRE:  Rate/Rhythm: 68 SR  BP:  Sitting: 113/45      SaO2: 94 RA  MODE:  Ambulation: 170 ft   POST:  Rate/Rhythm: 92 SR  BP:  Sitting: 126/73    SaO2: 99 RA   Pt agreeable to ambulate this afternoon, feeling some better. Pt ambulated 117ft in hallway assist of one with rollator and gait belt. Encouraged pt to increase distance, pt, pt states she will with her next walk. Pt returned to recliner, set up for lunch. Encouraged continued ambulation, IS and Flutter use. Will continue to follow.  [phone] Rufina Falco, RN BSN 07/13/2020 12:39 PM

## 2020-07-13 NOTE — Progress Notes (Signed)
Mobility Specialist - Progress Note   07/13/20 1437  Mobility  Activity Ambulated in hall  Level of Assistance Minimal assist, patient does 75% or more  Assistive Device Four wheel walker  Distance Ambulated (ft) 180 ft  Mobility Response Tolerated well  Mobility performed by Mobility specialist  $Mobility charge 1 Mobility   Pre-mobility: 74 HR, 96% SpO2 Post-mobility: 76 HR, 96% SpO2  Pt c/o upper back pain whenever she breathes, otherwise asx. She was min assist to stand from chair and to elevate legs when getting in bed.   [name] Mobility Specialist Mobility Specialist Phone: [phone]

## 2020-07-13 NOTE — Progress Notes (Signed)
07/13/2020 5:15 AM Pt ambulated with rolling walker to nursing station where she began to feel weak and pt asked to go back to room.   Carney [place]

## 2020-07-13 NOTE — Progress Notes (Signed)
CARDIAC REHAB PHASE I   Offered to walk with pt. Pt declining at this time c/o fatigue and weakness. Requesting time to rest this morning. Will return to continue to encourage ambulation as time allows.  Rufina Falco, RN BSN 07/13/2020 9:02 AM

## 2020-07-14 LAB — TYPE AND SCREEN
ABO/RH(D): A POS
Antibody Screen: NEGATIVE
Unit division: 0
Unit division: 0
Unit division: 0
Unit division: 0

## 2020-07-14 LAB — BPAM RBC
Blood Product Expiration Date: 202204242359
Blood Product Expiration Date: 202204242359
Blood Product Expiration Date: 202204242359
Blood Product Expiration Date: 202204242359
ISSUE DATE / TIME: 202203300847
ISSUE DATE / TIME: 202203300847
ISSUE DATE / TIME: 202203301222
ISSUE DATE / TIME: 202203301222
Unit Type and Rh: 6200
Unit Type and Rh: 6200
Unit Type and Rh: 6200
Unit Type and Rh: 6200

## 2020-07-14 LAB — GLUCOSE, CAPILLARY
Glucose-Capillary: 125 mg/dL — ABNORMAL HIGH (ref 70–99)
Glucose-Capillary: 138 mg/dL — ABNORMAL HIGH (ref 70–99)
Glucose-Capillary: 127 mg/dL — ABNORMAL HIGH (ref 70–99)
Glucose-Capillary: 127 mg/dL — ABNORMAL HIGH (ref 70–99)

## 2020-07-14 MED ORDER — POTASSIUM CHLORIDE CRYS ER 20 MEQ PO TBCR
20.0000 meq | EXTENDED_RELEASE_TABLET | Freq: Every day | ORAL | Status: DC
  Administered 2020-07-14 – 2020-07-16 (×3): 20 meq via ORAL
  Filled 2020-07-14 (×3): qty 1

## 2020-07-14 MED ORDER — FUROSEMIDE 40 MG PO TABS
40.0000 mg | ORAL_TABLET | Freq: Every day | ORAL | Status: DC
  Administered 2020-07-15 – 2020-07-16 (×2): 40 mg via ORAL
  Filled 2020-07-14 (×2): qty 1

## 2020-07-14 MED FILL — Electrolyte-R (PH 7.4) Solution: INTRAVENOUS | Qty: 4000 | Status: AC

## 2020-07-14 MED FILL — Mannitol IV Soln 20%: INTRAVENOUS | Qty: 500 | Status: AC

## 2020-07-14 MED FILL — Sodium Bicarbonate IV Soln 8.4%: INTRAVENOUS | Qty: 50 | Status: AC

## 2020-07-14 MED FILL — Lidocaine HCl Local Soln Prefilled Syringe 100 MG/5ML (2%): INTRAMUSCULAR | Qty: 10 | Status: AC

## 2020-07-14 MED FILL — Calcium Chloride Inj 10%: INTRAVENOUS | Qty: 10 | Status: AC

## 2020-07-14 MED FILL — Heparin Sodium (Porcine) Inj 1000 Unit/ML: INTRAMUSCULAR | Qty: 10 | Status: AC

## 2020-07-14 MED FILL — Sodium Chloride IV Soln 0.9%: INTRAVENOUS | Qty: 2000 | Status: AC

## 2020-07-14 NOTE — Progress Notes (Signed)
Mobility Specialist - Progress Note   07/14/20 1440  Mobility  Activity Ambulated in hall  Level of Assistance Standby assist, set-up cues, supervision of patient - no hands on  Assistive Device Four wheel walker  Distance Ambulated (ft) 430 ft  Mobility Response Tolerated well  Mobility performed by Mobility specialist  $Mobility charge 1 Mobility   Pre-mobility: 74 HR During mobility: 102 HR Post-mobility: 109 HR  Pt slightly SOB towards the end of ambulation, otherwise asx. Pt to recliner after walk, husband in room.   [name] Mobility Specialist Mobility Specialist Phone: [phone]

## 2020-07-14 NOTE — Progress Notes (Signed)
EPW removed without difficulty, VSS, bedrest for 1 hour.

## 2020-07-14 NOTE — Progress Notes (Addendum)
[address]       [place] [address]             [phone]      4 Days Post-Op Procedure(s) (LRB): CORONARY ARTERY BYPASS GRAFTING (CABG)x5. LEFT INTERNAL MAMMARY ARTERY. RIGHT SAPHENOUS VEIN HARVESTING. LEFT ATRIAL APPENDAGE CLIPPING. (N/A) CLIPPING OF ATRIAL APPENDAGE (N/A) TRANSESOPHAGEAL ECHOCARDIOGRAM (TEE) (N/A) Subjective: Feels pretty well, no BM  Objective: Vital signs in last 24 hours: Temp:  [98.3 F (36.8 C)-99.3 F (37.4 C)] 98.3 F (36.8 C) (04/03 0725) Pulse Rate:  [71-83] 71 (04/03 0725) Cardiac Rhythm: Normal sinus rhythm (04/02 1900) Resp:  [15-20] 18 (04/03 0725) BP: (100-133)/(43-55) 127/51 (04/03 0725) SpO2:  [91 %-100 %] 95 % (04/03 0725) Weight:  [86.3 kg] 86.3 kg (04/03 0419)  Hemodynamic parameters for last 24 hours:    Intake/Output from previous day: 04/02 0701 - 04/03 0700 In: 3 [I.V.:3] Out: -  Intake/Output this shift: No intake/output data recorded.  General appearance: alert, cooperative and no distress Heart: regular rate and rhythm Lungs: min dim in bases Abdomen: benign Extremities: no edema Wound: incis healing well  Lab Results: Recent Labs    07/12/20 0445 07/13/20 0119  WBC 13.2* 12.4*  HGB 9.7* 9.3*  HCT 30.1* 28.8*  PLT 86* 87*   BMET:  Recent Labs    07/12/20 0445 07/13/20 0119  NA 133* 132*  K 4.3 4.4  CL 99 100  CO2 30 28  GLUCOSE 86 128*  BUN 19 25*  CREATININE 0.92 1.23*  CALCIUM 8.3* 8.3*    PT/INR: No results for input(s): LABPROT, INR in the last 72 hours. ABG    Component Value Date/Time   PHART 7.354 07/11/2020 0151   HCO3 24.3 07/11/2020 0151   TCO2 26 07/11/2020 0151   ACIDBASEDEF 1.0 07/11/2020 0151   O2SAT 99.0 07/11/2020 0151   CBG (last 3)  Recent Labs    07/13/20 1618 07/13/20 2051 07/14/20 0610  GLUCAP 131* 133* 125*    Meds Scheduled Meds: . acetaminophen  1,000 mg Oral Q6H   Or  . acetaminophen (TYLENOL) oral liquid 160 mg/5 mL  1,000 mg  Per Tube Q6H  . aspirin EC  81 mg Oral Daily  . bisacodyl  10 mg Oral Daily   Or  . bisacodyl  10 mg Rectal Daily  . Chlorhexidine Gluconate Cloth  6 each Topical Daily  . docusate sodium  200 mg Oral Daily  . furosemide  40 mg Oral BID  . insulin aspart  0-15 Units Subcutaneous TID WC  . mouth rinse  15 mL Mouth Rinse BID  . metoCLOPramide  10 mg Oral TID WC & HS  . pantoprazole  40 mg Oral Daily  . potassium chloride  20 mEq Oral BID  . rosuvastatin  10 mg Oral Daily  . sodium chloride flush  3 mL Intravenous Q12H   Continuous Infusions: . sodium chloride     PRN Meds:.sodium chloride, alum & mag hydroxide-simeth, magnesium hydroxide, metoprolol tartrate, ondansetron (ZOFRAN) IV, oxyCODONE, sodium chloride flush  Xrays DG Chest 2 View  Result Date: 07/13/2020 CLINICAL DATA:  Atelectasis. EXAM: CHEST - 2 VIEW COMPARISON:  07/12/2020 FINDINGS: Lungs are adequately inflated demonstrate stable bibasilar opacification compatible with moderate left effusion and small right effusion likely with associated bibasilar atelectasis. Infection in the lung bases is possible. Stable borderline cardiomegaly. Remainder of the exam is unchanged. IMPRESSION: Stable bibasilar opacification compatible with moderate left and small right effusions likely  with associated bibasilar atelectasis. Infection in the lung bases is possible. Electronically Signed   By: Marin Olp M.D.   On: 07/13/2020 08:40    Assessment/Plan: S/P Procedure(s) (LRB): CORONARY ARTERY BYPASS GRAFTING (CABG)x5. LEFT INTERNAL MAMMARY ARTERY. RIGHT SAPHENOUS VEIN HARVESTING. LEFT ATRIAL APPENDAGE CLIPPING. (N/A) CLIPPING OF ATRIAL APPENDAGE (N/A) TRANSESOPHAGEAL ECHOCARDIOGRAM (TEE) (N/A)  1 Tmax 99.3, VSS, sinus with occas PVC 2 sats good on 2 liters [place] 3 UOP- not accurate, weight slowly decreasing,  about 3 KG above preop- will reduce lasix to q day and prob stop at d/c 4 BS well controlled 5 no new labs, will repeat in am 6  d/c epw's today 7 push rehab and pulm toilet- prob home in am if no new issues    LOS: 4 days    John Giovanni PA-C Pager [phone] 07/14/2020 Patient seen and examined, agree with above. Except with LAA clip will probably need to go home on a diuretic  Remo Lipps C. Roxan Hockey, MD [facility] [phone]

## 2020-07-15 LAB — CBC
HCT: 31.8 % — ABNORMAL LOW (ref 36.0–46.0)
Hemoglobin: 10.2 g/dL — ABNORMAL LOW (ref 12.0–15.0)
MCH: 30 pg (ref 26.0–34.0)
MCHC: 32.1 g/dL (ref 30.0–36.0)
MCV: 93.5 fL (ref 80.0–100.0)
Platelets: 175 10*3/uL (ref 150–400)
RBC: 3.4 MIL/uL — ABNORMAL LOW (ref 3.87–5.11)
RDW: 14.4 % (ref 11.5–15.5)
WBC: 10.6 10*3/uL — ABNORMAL HIGH (ref 4.0–10.5)
nRBC: 0 % (ref 0.0–0.2)

## 2020-07-15 LAB — GLUCOSE, CAPILLARY
Glucose-Capillary: 112 mg/dL — ABNORMAL HIGH (ref 70–99)
Glucose-Capillary: 116 mg/dL — ABNORMAL HIGH (ref 70–99)
Glucose-Capillary: 120 mg/dL — ABNORMAL HIGH (ref 70–99)

## 2020-07-15 LAB — BASIC METABOLIC PANEL
Anion gap: 9 (ref 5–15)
BUN: 21 mg/dL (ref 8–23)
CO2: 29 mmol/L (ref 22–32)
Calcium: 9.2 mg/dL (ref 8.9–10.3)
Chloride: 99 mmol/L (ref 98–111)
Creatinine, Ser: 0.96 mg/dL (ref 0.44–1.00)
GFR, Estimated: >60 mL/min (ref >60)
Glucose, Bld: 152 mg/dL — ABNORMAL HIGH (ref 70–99)
Potassium: 3.6 mmol/L (ref 3.5–5.1)
Sodium: 137 mmol/L (ref 135–145)

## 2020-07-15 MED ORDER — POTASSIUM CHLORIDE CRYS ER 20 MEQ PO TBCR
40.0000 meq | EXTENDED_RELEASE_TABLET | Freq: Once | ORAL | Status: AC
  Administered 2020-07-15: 40 meq via ORAL
  Filled 2020-07-15: qty 2

## 2020-07-15 MED ORDER — DILTIAZEM HCL ER COATED BEADS 120 MG PO CP24
120.0000 mg | ORAL_CAPSULE | Freq: Every day | ORAL | Status: DC
  Administered 2020-07-15: 120 mg via ORAL
  Filled 2020-07-15: qty 1

## 2020-07-15 MED ORDER — APIXABAN 5 MG PO TABS
5.0000 mg | ORAL_TABLET | Freq: Two times a day (BID) | ORAL | Status: DC
  Administered 2020-07-15 – 2020-07-16 (×3): 5 mg via ORAL
  Filled 2020-07-15 (×3): qty 1

## 2020-07-15 NOTE — Progress Notes (Addendum)
      [address]       [place] [address]             [phone]        5 Days Post-Op Procedure(s) (LRB): CORONARY ARTERY BYPASS GRAFTING (CABG)x5. LEFT INTERNAL MAMMARY ARTERY. RIGHT SAPHENOUS VEIN HARVESTING. LEFT ATRIAL APPENDAGE CLIPPING. (N/A) CLIPPING OF ATRIAL APPENDAGE (N/A) TRANSESOPHAGEAL ECHOCARDIOGRAM (TEE) (N/A)  Subjective: Patient had a bowel movement earlier this am. She is eating breakfast.  Objective: Vital signs in last 24 hours: Temp:  [98 F (36.7 C)-99.8 F (37.7 C)] 98.8 F (37.1 C) (04/04 0447) Pulse Rate:  [71-81] 74 (04/04 0447) Cardiac Rhythm: Normal sinus rhythm (04/03 1935) Resp:  [15-20] 20 (04/04 0447) BP: (119-156)/(44-86) 156/46 (04/04 0447) SpO2:  [93 %-96 %] 93 % (04/04 0447) Weight:  [84.6 kg] 84.6 kg (04/04 0446)  Pre op weight 82.1 kg Current Weight  07/15/20 84.6 kg      Intake/Output from previous day: 04/03 0701 - 04/04 0700 In: 3 [I.V.:3] Out: -    Physical Exam:  Cardiovascular: RRR Pulmonary: Diminished bibasilar breath sounds L>R Abdomen: Soft, non tender, bowel sounds present. Extremities: Mild bilateral lower extremity edema. Wounds: Clean and dry.  No erythema or signs of infection.  Lab Results: CBC: Recent Labs    07/13/20 0119  WBC 12.4*  HGB 9.3*  HCT 28.8*  PLT 87*   BMET:  Recent Labs    07/13/20 0119  NA 132*  K 4.4  CL 100  CO2 28  GLUCOSE 128*  BUN 25*  CREATININE 1.23*  CALCIUM 8.3*    PT/INR:  Lab Results  Component Value Date   INR 1.4 (H) 07/10/2020   INR 1.1 07/09/2020   INR 1.24 10/28/2016   ABG:  INR: Will add last result for INR, ABG once components are confirmed Will add last 4 CBG results once components are confirmed  Assessment/Plan:  1. CV - History of a fib. SR. Will restart Apixaban as EPW removed yesterday and will restart low dose Cardizem CD 2.  Pulmonary - On room air. Encourage incentive spirometer 3. Volume Overload - On Lasix 40  mg daily. As long as creatinine is stable, will continue several days post op as still above pre op weight. 4.  Expected post op acute blood loss anemia - Last H and H 9.3 and 28.8 5. CBGs 127/127/112. Pre op HGA1C 5.6. No history of diabetes. Stop accu checks. 6. Will discuss disposition with Dr. Edman Circle M ZimmermanPA-C 07/15/2020,6:59 AM Patient seen and examined, agree with above Looks better but very anxious Plan DC home in [place]. Roxan Hockey, MD [facility] [phone]

## 2020-07-15 NOTE — Progress Notes (Signed)
Mobility Specialist - Progress Note   07/15/20 1323  Mobility  Activity Ambulated in hall  Level of Assistance Independent after set-up  Assistive Device Four wheel walker  Distance Ambulated (ft) 470 ft  Mobility Response Tolerated well  Mobility performed by Mobility specialist  $Mobility charge 1 Mobility   Pre-mobility: 77 HR During mobility: 91 HR Post-mobility: 85 HR  Pt had minor SOB towards the end of ambulation, otherwise asx. Pt to recliner after walk, call bell at side.   [name] Mobility Specialist Mobility Specialist Phone: [phone]

## 2020-07-15 NOTE — Care Management Important Message (Signed)
Important Message  Patient Details  Name: Savina Olshefski MRN: [id number] Date of Birth: 1950-05-17   Medicare Important Message Given:  Yes     Shelda Altes 07/15/2020, 10:27 AM

## 2020-07-15 NOTE — Progress Notes (Signed)
CARDIAC REHAB PHASE I   PRE:  Rate/Rhythm: 78 SR with PVCs    BP: sitting 152/55    SaO2: 97 RA  MODE:  Ambulation: 470 ft   POST:  Rate/Rhythm: 90 SR    BP: sitting 172/73     SaO2: 98 RA  Pt moving well, independent. Able to stand and walk with rollator. Slow and steady. To bed, able to lift her legs in to bed following sternal precautions. BP elevated. 500 mL on IS.   Discussed IS, sternal precautions, diet, exercise, and CRPII. Pt receptive. Her husband has limited mobility but does drive and cook. She has three steps without railing. Would be beneficial to have HHPT to help her mobilize often and safely. Has RW and rollator if needed. Will refer to [place], ACSM 07/15/2020 9:57 AM

## 2020-07-15 NOTE — Progress Notes (Signed)
Lab results now available. Creatinine improved and down to 0.96. Potassium will be supplemented as is 3.6. H and H stable at 10.2 and 31.8. Thrombocytopenia now resolved as platelets up to 175,000.

## 2020-07-15 NOTE — Discharge Instructions (Signed)
Discharge Instructions:  1. You may shower, please wash incisions daily with soap and water and keep dry.  If you wish to cover wounds with dressing you may do so but please keep clean and change daily.  No tub baths or swimming until incisions have completely healed.  If your incisions become red or develop any drainage please call our office at [phone]  2. No Driving until cleared by Dr. Leonarda Salon office and you are no longer using narcotic pain medications  3. Monitor your weight daily.. Please use the same scale and weigh at same time... If you gain 5-10 lbs in 48 hours with associated lower extremity swelling, please contact our office at [phone]  4. Fever of 101.5 for at least 24 hours with no source, please contact our office at [phone]  5. Activity- up as tolerated, please walk at least 3 times per day.  Avoid strenuous activity, no lifting, pushing, or pulling with your arms over 8-10 lbs for a minimum of 6 weeks  6. If any questions or concerns arise, please do not hesitate to contact our office at [place] office [phone]   Endoscopic Saphenous Vein Harvesting, Care After This sheet gives you information about how to care for yourself after your procedure. Your health care provider may also give you more specific instructions. If you have problems or questions, contact your health care provider. What can I expect after the procedure? After the procedure, it is common to have:  Pain.  Bruising.  Swelling.  Numbness. Follow these instructions at home: Incision care  Follow instructions from your health care provider about how to take care of your incisions. Make sure you: ? Wash your hands with soap and water before and after you change your bandages (dressings). If soap and water are not available, use hand sanitizer. ? Change your dressings as told by your health care provider. ? Leave stitches (sutures), skin glue, or adhesive strips in  place. These skin closures may need to stay in place for 2 weeks or longer. If adhesive strip edges start to loosen and curl up, you may trim the loose edges. Do not remove adhesive strips completely unless your health care provider tells you to do that.  Check your incision areas every day for signs of infection. Check for: ? More redness, swelling, or pain. ? Fluid or blood. ? Warmth. ? Pus or a bad smell.   Medicines  Take over-the-counter and prescription medicines only as told by your health care provider.  Ask your health care provider if the medicine prescribed to you requires you to avoid driving or using heavy machinery. General instructions  Raise (elevate) your legs above the level of your heart while you are sitting or lying down.  Avoid crossing your legs.  Avoid sitting for long periods of time. Change positions every 30 minutes.  Do any exercises your health care providers have given you. These may include deep breathing, coughing, and walking exercises.  Do not take baths, swim, or use a hot tub until your health care provider approves. Ask your health care provider if you may take showers. You may only be allowed to take sponge baths.  Wear compression stockings as told by your health care provider. These stockings help to prevent blood clots and reduce swelling in your legs.  Keep all follow-up visits as told by your health care provider. This is important. Contact a health care provider if:  Medicine does not help your pain.  Your pain gets worse.  You have new leg bruises or your leg bruises get bigger.  Your leg feels numb.  You have more redness, swelling, or pain around your incision.  You have fluid or blood coming from your incision.  Your incision feels warm to the touch.  You have pus or a bad smell coming from your incision.  You have a fever. Get help right away if:  Your pain is severe.  You develop pain, tenderness, warmth, redness, or  swelling in any part of your leg.  You have chest pain.  You have trouble breathing. Summary  Raise (elevate) your legs above the level of your heart while you are sitting or lying down.  Wear compression stockings as told by your health care provider.  Make sure you know which symptoms should prompt you to contact your health care provider.  Keep all follow-up visits as told by your health care provider. This information is not intended to replace advice given to you by your health care provider. Make sure you discuss any questions you have with your health care provider. Document Revised: 03/07/2018 Document Reviewed: 03/07/2018 Elsevier Patient Education  2021 [employer].

## 2020-07-16 MED ORDER — FUROSEMIDE 40 MG PO TABS: 40.0000 mg | ORAL_TABLET | Freq: Every day | ORAL | 0 refills | Status: DC

## 2020-07-16 MED ORDER — DILTIAZEM HCL ER COATED BEADS 180 MG PO CP24
180.0000 mg | ORAL_CAPSULE | Freq: Every day | ORAL | Status: DC
  Administered 2020-07-16: 180 mg via ORAL
  Filled 2020-07-16: qty 1

## 2020-07-16 MED ORDER — POTASSIUM CHLORIDE CRYS ER 20 MEQ PO TBCR: 20.0000 meq | EXTENDED_RELEASE_TABLET | Freq: Every day | ORAL | 0 refills | Status: DC

## 2020-07-16 MED ORDER — IRBESARTAN 150 MG PO TABS
75.0000 mg | ORAL_TABLET | Freq: Every day | ORAL | Status: DC
  Administered 2020-07-16: 75 mg via ORAL
  Filled 2020-07-16: qty 1

## 2020-07-16 MED ORDER — ELIQUIS 5 MG PO TABS: 5.0000 mg | ORAL_TABLET | Freq: Two times a day (BID) | ORAL | Status: DC

## 2020-07-16 MED ORDER — TRAMADOL HCL 50 MG PO TABS: 50.0000 mg | ORAL_TABLET | Freq: Four times a day (QID) | ORAL | 0 refills | Status: DC | PRN

## 2020-07-16 MED ORDER — DILTIAZEM HCL ER COATED BEADS 180 MG PO CP24: 180.0000 mg | ORAL_CAPSULE | Freq: Every day | ORAL | 3 refills | Status: DC

## 2020-07-16 MED ORDER — VALSARTAN 80 MG PO TABS: 80.0000 mg | ORAL_TABLET | Freq: Every day | ORAL | 1 refills | Status: DC

## 2020-07-16 MED ORDER — ACETAMINOPHEN 325 MG PO TABS
650.0000 mg | ORAL_TABLET | Freq: Once | ORAL | Status: AC
  Administered 2020-07-16: 650 mg via ORAL
  Filled 2020-07-16: qty 2

## 2020-07-16 NOTE — TOC Transition Note (Signed)
Transition of Care [facility]) - CM/SW Discharge Note   Patient Details  Name: Larine Fielding MRN: [id number] Date of Birth: 1950-09-20  [facility] CM/SW Contact:  Milinda Antis, [place] Phone Number: 07/16/2020, 11:11 AM   Clinical Narrative:     Patient will DC to: Home Anticipated DC date:  07/16/2020 Family notified: yes Transport by: spouse   CSW met with patient and spoke with the patient about d/c orders for home health with PT.  The patient was agreeable.  Patient was given compare list from medicare.gov and choose [facility].  CSW verified the patient's address, telephone number, and verified that the patient has a PCP.    Alvis Lemmings agreed to accept the patient.  Per MD patient ready for DC home with home health services from [facility].  The patient reported that the spouse will be transporting her home.    CSW will sign off for now as social work intervention is no longer needed. Please consult [place] again if new needs arise.   Final next level of care: [place] Barriers to Discharge: No Barriers Identified   Patient Goals and CMS Choice Patient states their goals for this hospitalization and ongoing recovery are:: To be at home CMS Medicare.gov Compare Post Acute Care list provided to:: Patient Choice offered to / list presented to : Patient  Discharge Placement                       Discharge Plan and Services   Discharge Planning Services: CM Consult Post Acute Care Choice: Home Health          DME Arranged: N/A         HH Arranged: Social Work [employer] Agency: [place] Date [facility] Agency Contacted: 07/16/20 Time [place]: 1030 Representative spoke with at [place]: Adela Lank  Social Determinants of Health (SDOH) Interventions     Readmission Risk Interventions No flowsheet data found.

## 2020-07-16 NOTE — Progress Notes (Signed)
Patient discharged home via wheelchair with husband.  

## 2020-07-16 NOTE — Progress Notes (Signed)
Discharge teaching complete. Meds, diet, activity, follow up appointments, incision care and CABG teaching all completed and all questions answered. Copy of instructions given to patient and prescriptions sent to pharmacy. Chest tube sutures removed and steri-strips applied.

## 2020-07-16 NOTE — Progress Notes (Addendum)
      [address]       [place] [address]             [phone]        6 Days Post-Op Procedure(s) (LRB): CORONARY ARTERY BYPASS GRAFTING (CABG)x5. LEFT INTERNAL MAMMARY ARTERY. RIGHT SAPHENOUS VEIN HARVESTING. LEFT ATRIAL APPENDAGE CLIPPING. (N/A) CLIPPING OF ATRIAL APPENDAGE (N/A) TRANSESOPHAGEAL ECHOCARDIOGRAM (TEE) (N/A)  Subjective: Patient states every once in a while, she feels her heart "jerk". She does not think it is a fib. Otherwise, no complaints and she wants to go home.  Objective: Vital signs in last 24 hours: Temp:  [98.3 F (36.8 C)-99.6 F (37.6 C)] 99.6 F (37.6 C) (04/05 0425) Pulse Rate:  [71-79] 78 (04/05 0425) Cardiac Rhythm: Normal sinus rhythm (04/04 1900) Resp:  [15-19] 15 (04/05 0425) BP: (121-162)/(46-87) 158/87 (04/05 0425) SpO2:  [94 %-98 %] 94 % (04/05 0425) Weight:  [83.5 kg] 83.5 kg (04/05 0436)  Pre op weight 82.1 kg Current Weight  07/16/20 83.5 kg      Intake/Output from previous day: 04/04 0701 - 04/05 0700 In: 140 [P.O.:140] Out: -    Physical Exam:  Cardiovascular: RRR Pulmonary:Diminished bibasilar breath sounds L>R Abdomen: Soft, non tender, bowel sounds present. Extremities: Trace bilateral lower extremity edema. Wounds: Clean and dry.  No erythema or signs of infection.  Lab Results: CBC: Recent Labs    07/15/20 0824  WBC 10.6*  HGB 10.2*  HCT 31.8*  PLT 175   BMET:  Recent Labs    07/15/20 0824  NA 137  K 3.6  CL 99  CO2 29  GLUCOSE 152*  BUN 21  CREATININE 0.96  CALCIUM 9.2    PT/INR:  Lab Results  Component Value Date   INR 1.4 (H) 07/10/2020   INR 1.1 07/09/2020   INR 1.24 10/28/2016   ABG:  INR: Will add last result for INR, ABG once components are confirmed Will add last 4 CBG results once components are confirmed  Assessment/Plan:  1. CV - History of a fib. SR. On Cardizem CD 120 but will resume 180 mg daily. She is also on Apixaban. SBP in the 150's so will  restart low dose Valsartan. Will transition back to Diovan HCTZ after discharge. 2.  Pulmonary - On room air. Encourage incentive spirometer 3. Volume Overload - On Lasix 40 mg daily.Will continue several days post op as still above pre op weight. 4.  Expected post op acute blood loss anemia - Last H and H 10.2 and 31.8 4. HHPT ordered;discharge  Sharalyn Ink [facility] 07/16/2020,7:00 AM Patient seen and examined, agree with above Home today  [place]. Roxan Hockey, MD [facility] [phone]

## 2020-07-17 ENCOUNTER — Telehealth (HOSPITAL_COMMUNITY): Payer: Self-pay

## 2020-07-17 NOTE — Telephone Encounter (Signed)
Per phase I, fax cardiac rehab referral to [facility].  

## 2020-07-18 DIAGNOSIS — E782 Mixed hyperlipidemia: Secondary | ICD-10-CM | POA: Diagnosis not present

## 2020-07-18 DIAGNOSIS — D696 Thrombocytopenia, unspecified: Secondary | ICD-10-CM | POA: Diagnosis not present

## 2020-07-18 DIAGNOSIS — I471 Supraventricular tachycardia: Secondary | ICD-10-CM | POA: Diagnosis not present

## 2020-07-18 DIAGNOSIS — Z9181 History of falling: Secondary | ICD-10-CM | POA: Diagnosis not present

## 2020-07-18 DIAGNOSIS — F32A Depression, unspecified: Secondary | ICD-10-CM | POA: Diagnosis not present

## 2020-07-18 DIAGNOSIS — F419 Anxiety disorder, unspecified: Secondary | ICD-10-CM | POA: Diagnosis not present

## 2020-07-18 DIAGNOSIS — I480 Paroxysmal atrial fibrillation: Secondary | ICD-10-CM | POA: Diagnosis not present

## 2020-07-18 DIAGNOSIS — Z7901 Long term (current) use of anticoagulants: Secondary | ICD-10-CM | POA: Diagnosis not present

## 2020-07-18 DIAGNOSIS — Z951 Presence of aortocoronary bypass graft: Secondary | ICD-10-CM | POA: Diagnosis not present

## 2020-07-18 DIAGNOSIS — I10 Essential (primary) hypertension: Secondary | ICD-10-CM | POA: Diagnosis not present

## 2020-07-18 DIAGNOSIS — Z48812 Encounter for surgical aftercare following surgery on the circulatory system: Secondary | ICD-10-CM | POA: Diagnosis not present

## 2020-07-18 DIAGNOSIS — Z7982 Long term (current) use of aspirin: Secondary | ICD-10-CM | POA: Diagnosis not present

## 2020-07-18 DIAGNOSIS — I2510 Atherosclerotic heart disease of native coronary artery without angina pectoris: Secondary | ICD-10-CM | POA: Diagnosis not present

## 2020-07-18 DIAGNOSIS — D62 Acute posthemorrhagic anemia: Secondary | ICD-10-CM | POA: Diagnosis not present

## 2020-07-18 DIAGNOSIS — H9190 Unspecified hearing loss, unspecified ear: Secondary | ICD-10-CM | POA: Diagnosis not present

## 2020-07-19 ENCOUNTER — Telehealth: Payer: Self-pay | Admitting: Student

## 2020-07-19 NOTE — Telephone Encounter (Signed)
New message:     Pt wants to know if she still her appt that she have on 07-22-20? She said she think this appt was made before her surgery.

## 2020-07-19 NOTE — Telephone Encounter (Signed)
I called and spoke with pt. She is feeling ok other than a little dizzy sometimes and sore. I advised her to check her BP and HR at the time she is feeling dizzy and to call if she is concerned. Appt with AT cancelled for Monday 4/11 and appt with Dr Curt Bears moved up to July 25th in [place].

## 2020-07-19 NOTE — Telephone Encounter (Signed)
It was made before her surgery.   I will be more than happy to see her, but if she is doing well and would prefer to consolidate her appointments, I would move her appointment with Dr. Curt Bears up to the 3 month mark Carlean Purl)  for a little closer follow up.  Thank you! Jonni Sanger

## 2020-07-19 NOTE — Telephone Encounter (Signed)
Pt is scheduled with you on Monday 4/11...this was originally to f/u from her Cath. Since then she has had CABG. She has a f/u with the surgeon on 4/19. Do you still need to see her on Monday? She has a 6 mth f/u scheduled in Sept with Dr Curt Bears.

## 2020-07-22 ENCOUNTER — Ambulatory Visit: Payer: PPO | Admitting: Student

## 2020-07-22 ENCOUNTER — Telehealth: Payer: Self-pay | Admitting: Cardiology

## 2020-07-22 NOTE — Telephone Encounter (Signed)
  Patient c/o Palpitations:  High priority if patient c/o lightheadedness, shortness of breath, or chest pain  1) How long have you had palpitations/irregular HR/ Afib? Are you having the symptoms now? No  2) Are you currently experiencing lightheadedness, SOB or CP? No  3) Do you have a history of afib (atrial fibrillation) or irregular heart rhythm? Patient had bypass surgery on 07/10/20  4) Have you checked your BP or HR? (document readings if available): today 120/64 HR 66 O2 sat 67%  5) Are you experiencing any other symptoms? She is having dizziness when having episodes of Afib. Episodes seem to be happening at night.   Burke Keels, RN with Home Health called in for the patient whom she was with. She is asking we call the patient back.

## 2020-07-22 NOTE — Telephone Encounter (Signed)
Pt reports "speeding heart" woke her up from sleep about MN last night. "was speeding at 135" bpm.   BP low at 103/48. Finally fell back asleep about 3 am this morning. O2 sats 96/97 % (not 67). She states this is the only episode that she knows of since having her CABG. She also reports dizzy spells when she is sitting down, NOT standing up/after prolonged sitting.   Aware that she may need to further evaluate w/ PCP on this matter but will await MD advisement. She knows that we will probably wait and continue monitoring her AFib being that reported episodes only last a short time.  Aware to call office back if wakes her up again, symptoms occur and/or stays out for more that 24-48 hours. Patient verbalized understanding and agreeable to plan.

## 2020-07-23 NOTE — Telephone Encounter (Signed)
Pt aware to continue monitoring like we discussed yesterday.  Call office if worsens/becomes frequent. Aware to follow up w/ PCP about reported dizzy spells when sitting.  Aware to let me know if they feel further cardiology workup needed. Patient verbalized understanding and agreeable to plan.

## 2020-07-29 ENCOUNTER — Other Ambulatory Visit: Payer: Self-pay | Admitting: Thoracic Surgery (Cardiothoracic Vascular Surgery)

## 2020-07-29 DIAGNOSIS — Z951 Presence of aortocoronary bypass graft: Secondary | ICD-10-CM

## 2020-07-30 ENCOUNTER — Ambulatory Visit (INDEPENDENT_AMBULATORY_CARE_PROVIDER_SITE_OTHER): Payer: Self-pay | Admitting: Thoracic Surgery (Cardiothoracic Vascular Surgery)

## 2020-07-30 ENCOUNTER — Ambulatory Visit
Admission: RE | Admit: 2020-07-30 | Discharge: 2020-07-30 | Disposition: A | Discharge status: Home / Self Care | Payer: PPO | Source: Ambulatory Visit | Attending: Thoracic Surgery (Cardiothoracic Vascular Surgery) | Admitting: Thoracic Surgery (Cardiothoracic Vascular Surgery)

## 2020-07-30 ENCOUNTER — Encounter: Payer: Self-pay | Admitting: Thoracic Surgery (Cardiothoracic Vascular Surgery)

## 2020-07-30 ENCOUNTER — Other Ambulatory Visit: Payer: Self-pay

## 2020-07-30 VITALS — BP 169/79 | HR 74 | Temp 97.6°F | Resp 20 | Ht 68.0 in | Wt 182.0 lb

## 2020-07-30 DIAGNOSIS — D696 Thrombocytopenia, unspecified: Secondary | ICD-10-CM | POA: Diagnosis not present

## 2020-07-30 DIAGNOSIS — Z7982 Long term (current) use of aspirin: Secondary | ICD-10-CM | POA: Diagnosis not present

## 2020-07-30 DIAGNOSIS — I25119 Atherosclerotic heart disease of native coronary artery with unspecified angina pectoris: Secondary | ICD-10-CM

## 2020-07-30 DIAGNOSIS — F419 Anxiety disorder, unspecified: Secondary | ICD-10-CM | POA: Diagnosis not present

## 2020-07-30 DIAGNOSIS — I471 Supraventricular tachycardia: Secondary | ICD-10-CM | POA: Diagnosis not present

## 2020-07-30 DIAGNOSIS — D62 Acute posthemorrhagic anemia: Secondary | ICD-10-CM | POA: Diagnosis not present

## 2020-07-30 DIAGNOSIS — H9190 Unspecified hearing loss, unspecified ear: Secondary | ICD-10-CM | POA: Diagnosis not present

## 2020-07-30 DIAGNOSIS — F32A Depression, unspecified: Secondary | ICD-10-CM | POA: Diagnosis not present

## 2020-07-30 DIAGNOSIS — Z951 Presence of aortocoronary bypass graft: Secondary | ICD-10-CM

## 2020-07-30 DIAGNOSIS — I480 Paroxysmal atrial fibrillation: Secondary | ICD-10-CM | POA: Diagnosis not present

## 2020-07-30 DIAGNOSIS — Z9181 History of falling: Secondary | ICD-10-CM | POA: Diagnosis not present

## 2020-07-30 DIAGNOSIS — I10 Essential (primary) hypertension: Secondary | ICD-10-CM | POA: Diagnosis not present

## 2020-07-30 DIAGNOSIS — R079 Chest pain, unspecified: Secondary | ICD-10-CM | POA: Diagnosis not present

## 2020-07-30 DIAGNOSIS — Z7901 Long term (current) use of anticoagulants: Secondary | ICD-10-CM | POA: Diagnosis not present

## 2020-07-30 DIAGNOSIS — Z48812 Encounter for surgical aftercare following surgery on the circulatory system: Secondary | ICD-10-CM | POA: Diagnosis not present

## 2020-07-30 DIAGNOSIS — E782 Mixed hyperlipidemia: Secondary | ICD-10-CM | POA: Diagnosis not present

## 2020-07-30 DIAGNOSIS — I2510 Atherosclerotic heart disease of native coronary artery without angina pectoris: Secondary | ICD-10-CM | POA: Diagnosis not present

## 2020-07-30 NOTE — Progress Notes (Signed)
[address]       [place] [address]             [phone]     HPI: [name] returns for a scheduled postoperative follow-up visit  Vanessa [name] is a 70 year old woman with a history of hypertension, hyperlipidemia, paroxysmal atrial fibrillation, arthritis, gout,, anxiety, and depression.  She recently started having chest discomfort with radiation to her neck and jaw.  Cardiac CT showed multivessel disease.  Cardiac catheterization showed three-vessel disease with preserved left ventricular function.  I did CABG x5 in the left atrial appendage clip on 07/10/2020.  She had some intermittent atrial fibrillation postoperatively but went home in sinus rhythm.  She did go home on Eliquis.    Since discharge she has been having some issues with dizziness.  They usually only last a few seconds.  She is not aware of it occurring when she sit first gets up or when she lays down.  She cannot pin down anything as an inciting event.  She does still have some incisional pain.  She took narcotics for a while but has not taken them lately.  Past Medical History:  Diagnosis Date  . Anxiety   . AR (allergic rhinitis)   . Arthritis    knees  . Atrial fibrillation ([place])   . Cataract    bilateral /removed  . Coronary artery disease   . Depressed   . Dysrhythmia    atrial fibrillation off and on  . GERD (gastroesophageal reflux disease)   . Glaucoma   . HTN (hypertension)   . Hyperlipidemia   . Overweight(278.02)     Current Outpatient Medications  Medication Sig Dispense Refill  . apixaban (ELIQUIS) 5 MG TABS tablet Take 1 tablet (5 mg total) by mouth 2 (two) times daily.    . Ascorbic Acid (VITAMIN C) 1000 MG tablet Take 1,000 mg by mouth daily.    Marland Kitchen aspirin EC 81 MG tablet Take 1 tablet (81 mg total) by mouth daily. Swallow whole.    . Calcium Carbonate-Vitamin D (CALTRATE 600+D PO) Take 600 mg by mouth daily.    . Cholecalciferol (VITAMIN D3) 50 MCG (2000 UT)  TABS Take 2,000 Units by mouth daily.    . clotrimazole-betamethasone (LOTRISONE) cream Apply 1 application topically 2 (two) times daily as needed.    . diltiazem (CARDIZEM CD) 180 MG 24 hr capsule Take 1 capsule (180 mg total) by mouth daily. 90 capsule 3  . esomeprazole (NEXIUM) 40 MG capsule Take 1 capsule (40 mg total) by mouth daily. 30 capsule 6  . Multiple Vitamins-Minerals (ALIVE ONCE DAILY WOMENS 50+) TABS Take 1 tablet by mouth daily.    . Polyethylene Glycol 400 (BLINK TEARS) 0.25 % SOLN Place 1 drop into both eyes daily as needed (Dry eyes).    . rosuvastatin (CRESTOR) 10 MG tablet Take 1 tablet (10 mg total) by mouth daily. 90 tablet 3  . traMADol (ULTRAM) 50 MG tablet Take 1 tablet (50 mg total) by mouth every 6 (six) hours as needed for severe pain. 28 tablet 0  . valsartan (DIOVAN) 80 MG tablet Take 1 tablet (80 mg total) by mouth daily. 30 tablet 1  . Zinc 50 MG CAPS Take 50 mg by mouth daily.      No current facility-administered medications for this visit.    Physical Exam BP (!) 169/79   Pulse 74   Temp 97.6 F (36.4 C) (Skin)   Resp  20   Ht 5\' 8"  (1.727 m)   Wt 182 lb (82.6 kg)   SpO2 96% Comment: RA  BMI 27.2 kg/m  70 year old woman in no acute distress Alert and oriented x3 with no focal motor deficit, flat affect Lungs clear bilaterally Cardiac regular rate and rhythm Sternum stable, some tenderness to palpation along the sternal costal border bilaterally.  Incision healing well Leg incision well-healed, no peripheral edema  Diagnostic Tests: I personally reviewed her chest x-ray.  Shows postoperative changes with a tiny right effusion.  Improved aeration of the bases bilaterally.  Impression: Vanessa [name] is a 70 year old woman with a history of hypertension, hyperlipidemia, paroxysmal atrial fibrillation, arthritis, gout,, anxiety, and depression.  She presented with chest pain and was found to have three-vessel disease by coronary CT and  catheterization.  She had preserved left ventricular function.  She underwent coronary bypass grafting x5 and placement of the left atrial appendage clip on 07/10/2020.  Her postoperative course was unremarkable and she went home on day 6.  Since discharge she has been having some brief episodes of dizziness.  She is also noted that her blood pressures been running a little high.  She currently is taking Cardizem CD 180 mg daily and valsartan 80 mg daily.  The hydrochlorothiazide was held because of low blood pressure while she was in the hospital.  I instructed her to finish out the valsartan which should be about 2 more weeks and then she can resume the valsartan plus hydrochlorothiazide.  She has not yet seen cardiology.  We will try to help arrange an appointment with them for follow-up.  I do not think she is ready to drive yet.  She should not lift anything over 10 pounds for another 3weeks.  She was encouraged to walk as much as she can.  Plan: Follow-up with cardiology Return in 1 month to check on progress  Melrose Nakayama, MD [facility] [phone]

## 2020-07-31 ENCOUNTER — Encounter: Payer: Self-pay | Admitting: Cardiology

## 2020-07-31 ENCOUNTER — Ambulatory Visit: Payer: PPO | Admitting: Cardiology

## 2020-07-31 VITALS — BP 150/80 | HR 80 | Ht 68.0 in | Wt 182.0 lb

## 2020-07-31 DIAGNOSIS — I480 Paroxysmal atrial fibrillation: Secondary | ICD-10-CM | POA: Diagnosis not present

## 2020-07-31 MED ORDER — VALSARTAN-HYDROCHLOROTHIAZIDE 160-12.5 MG PO TABS: 1.0000 | ORAL_TABLET | Freq: Every day | ORAL | 3 refills | Status: DC

## 2020-07-31 NOTE — Patient Instructions (Addendum)
Medication Instructions:  Your physician has recommended you make the following change in your medication:  1. STOP Valsartan 2. RESTART Diovan 160/12.5 mg once daily  *If you need a refill on your cardiac medications before your next appointment, please call your pharmacy*   Lab Work: None ordered   Testing/Procedures: None ordered   Follow-Up: At [facility], you and your health needs are our priority.  As part of our continuing mission to provide you with exceptional heart care, we have created designated Provider Care Teams.  These Care Teams include your primary Cardiologist (physician) and Advanced Practice Providers (APPs -  Physician Assistants and Nurse Practitioners) who all work together to provide you with the care you need, when you need it.  Your next appointment:   3 month(s)  The format for your next appointment:   In Person  Provider:   Allegra Lai, MD    Thank you for choosing [facility]!!   Trinidad Curet, RN [phone]

## 2020-07-31 NOTE — Progress Notes (Signed)
Electrophysiology Office Note   Date:  07/31/2020   ID:  Vanessa [name], DOB [birth year], MRN [id number]  PCP:  Myrlene Broker, MD  Cardiologist:  Burt Knack Primary Electrophysiologist:  Susie Pousson [name], MD    No chief complaint on file.    History of Present Illness: Vanessa [name] is a 70 y.o. female who presents today for electrophysiology evaluation.     She has a history of hypertension, hyperlipidemia, and atrial fibrillation.  She is currently on Eliquis.  She was having atypical chest pain.  She had a cardiac CT that showed significant coronary artery disease.  She is now status post 5 vessel CABG 07/10/2020.  Today, denies symptoms of palpitations, chest pain, shortness of breath, orthopnea, PND, lower extremity edema, claudication, dizziness, presyncope, syncope, bleeding, or neurologic sequela. The patient is tolerating medications without difficulties.  Since her surgery, she has continued to have intermittent palpitations.  Palpitations occur on a daily basis and last for a few minutes at a time.  Aside from that, she is recovering well.  She continues to have chest and back pain that she relates to her sternotomy.  Past Medical History:  Diagnosis Date  . Anxiety   . AR (allergic rhinitis)   . Arthritis    knees  . Atrial fibrillation ([place])   . Cataract    bilateral /removed  . Coronary artery disease   . Depressed   . Dysrhythmia    atrial fibrillation off and on  . GERD (gastroesophageal reflux disease)   . Glaucoma   . HTN (hypertension)   . Hyperlipidemia   . Overweight(278.02)    Past Surgical History:  Procedure Laterality Date  . ABDOMINAL HYSTERECTOMY    . CARDIAC CATHETERIZATION    . CATARACT EXTRACTION    . catherization  11/00   normal  . CLIPPING OF ATRIAL APPENDAGE N/A 07/10/2020   Procedure: CLIPPING OF ATRIAL APPENDAGE;  Surgeon: Melrose Nakayama, MD;  Location: [facility];  Service: Open Heart Surgery;  Laterality: N/A;  . COLONOSCOPY     . CORONARY ARTERY BYPASS GRAFT N/A 07/10/2020   Procedure: CORONARY ARTERY BYPASS GRAFTING (CABG)x5. LEFT INTERNAL MAMMARY ARTERY. RIGHT SAPHENOUS VEIN HARVESTING. LEFT ATRIAL APPENDAGE CLIPPING.;  Surgeon: Melrose Nakayama, MD;  Location: [facility];  Service: Open Heart Surgery;  Laterality: N/A;  . cyrotherapy    . EP IMPLANTABLE DEVICE N/A 03/02/2016   Procedure: Loop Recorder Insertion;  Surgeon: Arlicia Paquette [name], MD;  Location: [facility];  Service: Cardiovascular;  Laterality: N/A;  . EYE SURGERY     x2  . GLAUCOMA SURGERY     bilateral  . KNEE ARTHROSCOPY     left knee  . LEFT HEART CATH AND CORONARY ANGIOGRAPHY N/A 07/01/2020   Procedure: LEFT HEART CATH AND CORONARY ANGIOGRAPHY;  Surgeon: Nelva Bush, MD;  Location: [facility];  Service: Cardiovascular;  Laterality: N/A;  . ROTATOR CUFF REPAIR     right shoulder  . TEE WITHOUT CARDIOVERSION N/A 07/10/2020   Procedure: TRANSESOPHAGEAL ECHOCARDIOGRAM (TEE);  Surgeon: Melrose Nakayama, MD;  Location: [facility];  Service: Open Heart Surgery;  Laterality: N/A;  . TONSILLECTOMY     removed as a tennager  . WISDOM TOOTH EXTRACTION       Current Outpatient Medications  Medication Sig Dispense Refill  . apixaban (ELIQUIS) 5 MG TABS tablet Take 1 tablet (5 mg total) by mouth 2 (two) times daily.    . Ascorbic Acid (VITAMIN C) 1000 MG  tablet Take 1,000 mg by mouth daily.    Marland Kitchen aspirin EC 81 MG tablet Take 1 tablet (81 mg total) by mouth daily. Swallow whole.    . Calcium Carbonate-Vitamin D (CALTRATE 600+D PO) Take 600 mg by mouth daily.    . Cholecalciferol (VITAMIN D3) 50 MCG (2000 UT) TABS Take 2,000 Units by mouth daily.    . clotrimazole-betamethasone (LOTRISONE) cream Apply 1 application topically 2 (two) times daily as needed.    . diltiazem (CARDIZEM CD) 180 MG 24 hr capsule Take 1 capsule (180 mg total) by mouth daily. 90 capsule 3  . esomeprazole (NEXIUM) 40 MG capsule Take 1 capsule (40 mg total) by  mouth daily. 30 capsule 6  . Multiple Vitamins-Minerals (ALIVE ONCE DAILY WOMENS 50+) TABS Take 1 tablet by mouth daily.    . Polyethylene Glycol 400 (BLINK TEARS) 0.25 % SOLN Place 1 drop into both eyes daily as needed (Dry eyes).    . rosuvastatin (CRESTOR) 10 MG tablet Take 1 tablet (10 mg total) by mouth daily. 90 tablet 3  . traMADol (ULTRAM) 50 MG tablet Take 1 tablet (50 mg total) by mouth every 6 (six) hours as needed for severe pain. 28 tablet 0  . valsartan-hydrochlorothiazide (DIOVAN HCT) 160-12.5 MG tablet Take 1 tablet by mouth daily. 90 tablet 3  . Zinc 50 MG CAPS Take 50 mg by mouth daily.      No current facility-administered medications for this visit.    Allergies:   Codeine   Social History:  The patient  reports that she has never smoked. She has never used smokeless tobacco. She reports that she does not drink alcohol and does not use drugs.   Family History:  The patient's family history includes Heart attack in her father; Stroke in her mother.   ROS:  Please see the history of present illness.   Otherwise, review of systems is positive for none.   All other systems are reviewed and negative.   PHYSICAL EXAM: VS:  BP (!) 150/80 (BP Location: Left Arm, Patient Position: Sitting, Cuff Size: Normal)   Pulse 80   Ht 5\' 8"  (1.727 m)   Wt 182 lb (82.6 kg)   SpO2 97%   BMI 27.67 kg/m  , BMI Body mass index is 27.67 kg/m. GEN: Well nourished, well developed, in no acute distress  HEENT: normal  Neck: no JVD, carotid bruits, or masses Cardiac: RRR; no murmurs, rubs, or gallops,no edema  Respiratory:  clear to auscultation bilaterally, normal work of breathing GI: soft, nontender, nondistended, + BS MS: no deformity or atrophy  Skin: warm and dry, device site well healed Neuro:  Strength and sensation are intact Psych: euthymic mood, full affect  EKG:  EKG is ordered today. Personal review of the ekg ordered shows sinus rhythm, T wave inversions, PVC, rate  80  Personal review of the device interrogation today. Results in [place]: 07/09/2020: ALT 18 07/11/2020: Magnesium 2.2 07/15/2020: BUN 21; Creatinine, Ser 0.96; Hemoglobin 10.2; Platelets 175; Potassium 3.6; Sodium 137    Lipid Panel     Component Value Date/Time   CHOL 129 05/20/2020 1216   TRIG 145 05/20/2020 1216   HDL 46 05/20/2020 1216   CHOLHDL 2.8 05/20/2020 1216   LDLCALC 58 05/20/2020 1216     Wt Readings from Last 3 Encounters:  07/31/20 182 lb (82.6 kg)  07/30/20 182 lb (82.6 kg)  07/16/20 184 lb (83.5 kg)      Other studies  Reviewed: Additional studies/ records that were reviewed today include: TTE 02/19/16  Review of the above records today demonstrates:  - Left ventricle: The cavity size was normal. There was mild focal   basal hypertrophy of the septum. Systolic function was normal.   The estimated ejection fraction was in the range of 55% to 60%.   Wall motion was normal; there were no regional wall motion   abnormalities. Doppler parameters are consistent with abnormal   left ventricular relaxation (grade 1 diastolic dysfunction). - Mitral valve: There was mild regurgitation. - Left atrium: The atrium was mildly dilated.  ASSESSMENT AND PLAN:  1.  Paroxysmal atrial fibrillation: Currently on Eliquis.  CHA2DS2-VASc of 3.  Status post left atrial appendage clipping.  She continues to have palpitations.  I told her to get an alive core monitor which may help to monitor her symptoms.  I have also told her that these may continue to improve as the inflammation postoperative improves.  2.  Hypertension: Elevated today.  We Urijah Arko restart her valsartan/HCTZ.  3.  Coronary artery disease: Status post CABG x5 07/10/2020.  She also had a left atrial appendage clip at the time.  Currently healing from her surgery.  No changes at this time.   Current medicines are reviewed at length with the patient today.   The patient does not have concerns regarding  her medicines.  The following changes were made today: Restart valsartan/HCTZ, stop Diovan  Labs/ tests ordered today include:  Orders Placed This Encounter  Procedures  . EKG 12-Lead     Disposition:   FU with Celest Reitz 3 months  Signed, Arrin Pintor [name], MD  07/31/2020 12:32 PM     [facility] [address] [place] [address] [phone] (office) [phone] (fax)

## 2020-08-05 DIAGNOSIS — H402221 Chronic angle-closure glaucoma, left eye, mild stage: Secondary | ICD-10-CM | POA: Diagnosis not present

## 2020-08-05 DIAGNOSIS — H402212 Chronic angle-closure glaucoma, right eye, moderate stage: Secondary | ICD-10-CM | POA: Diagnosis not present

## 2020-08-06 ENCOUNTER — Ambulatory Visit: Payer: PPO | Admitting: Physician Assistant

## 2020-08-19 ENCOUNTER — Telehealth: Payer: Self-pay

## 2020-08-19 DIAGNOSIS — R002 Palpitations: Secondary | ICD-10-CM

## 2020-08-19 DIAGNOSIS — I480 Paroxysmal atrial fibrillation: Secondary | ICD-10-CM

## 2020-08-19 NOTE — Telephone Encounter (Signed)
-----   Message from Laury Deep, RN sent at 08/19/2020 12:21 PM EDT ----- Marykay Lex girls,   Patient left me a VM and said she had srgy with [facility] 3/30 and is wondering if she is cleared for Cardiac Rehab in [facility]?  Can you confirm and call patient once you know?  I would assume yes but not sure.  Thanks,  Ryan   Her number is [phone]

## 2020-08-19 NOTE — Telephone Encounter (Signed)
Patient advised at her last appointment that she needed to wait to drive and continue no lifting anything greater than 10 pounds for another month until follow up with Dr. Roxan Hockey 09/03/20.  Patient advised that she should wait until seen in the clinic to be cleared for cardiac rehab by Dr. Roxan Hockey, she acknowledged receipt.

## 2020-08-23 ENCOUNTER — Ambulatory Visit (INDEPENDENT_AMBULATORY_CARE_PROVIDER_SITE_OTHER): Payer: PPO

## 2020-08-23 ENCOUNTER — Encounter: Payer: Self-pay | Admitting: Radiology

## 2020-08-23 DIAGNOSIS — I480 Paroxysmal atrial fibrillation: Secondary | ICD-10-CM

## 2020-08-23 DIAGNOSIS — R002 Palpitations: Secondary | ICD-10-CM

## 2020-08-23 NOTE — Progress Notes (Signed)
Enrolled patient for a 14 day Zio XT  monitor to be mailed to patients home  °

## 2020-08-27 DIAGNOSIS — I480 Paroxysmal atrial fibrillation: Secondary | ICD-10-CM

## 2020-08-27 DIAGNOSIS — R002 Palpitations: Secondary | ICD-10-CM

## 2020-09-03 ENCOUNTER — Other Ambulatory Visit: Payer: Self-pay

## 2020-09-03 ENCOUNTER — Ambulatory Visit (INDEPENDENT_AMBULATORY_CARE_PROVIDER_SITE_OTHER): Payer: Self-pay | Admitting: Thoracic Surgery (Cardiothoracic Vascular Surgery)

## 2020-09-03 ENCOUNTER — Other Ambulatory Visit: Payer: Self-pay | Admitting: *Deleted

## 2020-09-03 VITALS — BP 119/59 | HR 63 | Resp 20 | Ht 68.0 in | Wt 182.0 lb

## 2020-09-03 DIAGNOSIS — Z951 Presence of aortocoronary bypass graft: Secondary | ICD-10-CM

## 2020-09-03 MED ORDER — MECLIZINE HCL 12.5 MG PO TABS: 25.0000 mg | ORAL_TABLET | Freq: Three times a day (TID) | ORAL | Status: DC | PRN

## 2020-09-03 MED ORDER — MECLIZINE HCL 25 MG PO TABS: 25.0000 mg | ORAL_TABLET | Freq: Three times a day (TID) | ORAL | 0 refills | Status: DC | PRN

## 2020-09-03 NOTE — Progress Notes (Signed)
[address]       [place] [address]             [phone]      HPI: Vanessa [name] returns for scheduled follow-up visit  Vanessa [name] is a 70 year old woman with a history of hypertension, hyperlipidemia, paroxysmal atrial fibrillation, arthritis, gout, anxiety, and depression.  She had coronary bypass grafting x5 and left atrial appendage clipping on 07/10/2020.  She had some intermittent atrial fibrillation postop and went home in sinus rhythm.  I saw her in the office on 07/30/2020.  She was having some issues with dizziness.  She saw Dr. Curt Bears the following day and he put her on a monitor.  She still has that.  She sometimes has a burning sensation along the left side of her sternum and other times she will have a shooting pain in the left side of the chest but overall she is doing well from a pain standpoint.  She continues to have trouble with dizziness.  She describes this feeling as being like her head going one way and her body is going to another.  It is not positional.  It is not correlated with heart rate.  She says she has had inner ear problems and taken Antivert previously.  She does not currently have a prescription.  Past Medical History:  Diagnosis Date  . Anxiety   . AR (allergic rhinitis)   . Arthritis    knees  . Atrial fibrillation ([place])   . Cataract    bilateral /removed  . Coronary artery disease   . Depressed   . Dysrhythmia    atrial fibrillation off and on  . GERD (gastroesophageal reflux disease)   . Glaucoma   . HTN (hypertension)   . Hyperlipidemia   . Overweight(278.02)     Current Outpatient Medications  Medication Sig Dispense Refill  . apixaban (ELIQUIS) 5 MG TABS tablet Take 1 tablet (5 mg total) by mouth 2 (two) times daily.    . Ascorbic Acid (VITAMIN C) 1000 MG tablet Take 1,000 mg by mouth daily.    Marland Kitchen aspirin EC 81 MG tablet Take 1 tablet (81 mg total) by mouth daily. Swallow whole.    . Calcium Carbonate-Vitamin  D (CALTRATE 600+D PO) Take 600 mg by mouth daily.    . Cholecalciferol (VITAMIN D3) 50 MCG (2000 UT) TABS Take 2,000 Units by mouth daily.    Marland Kitchen diltiazem (CARDIZEM CD) 180 MG 24 hr capsule Take 1 capsule (180 mg total) by mouth daily. 90 capsule 3  . esomeprazole (NEXIUM) 40 MG capsule Take 1 capsule (40 mg total) by mouth daily. 30 capsule 6  . Multiple Vitamins-Minerals (ALIVE ONCE DAILY WOMENS 50+) TABS Take 1 tablet by mouth daily.    . Polyethylene Glycol 400 (BLINK TEARS) 0.25 % SOLN Place 1 drop into both eyes daily as needed (Dry eyes).    . rosuvastatin (CRESTOR) 10 MG tablet Take 1 tablet (10 mg total) by mouth daily. 90 tablet 3  . valsartan-hydrochlorothiazide (DIOVAN HCT) 160-12.5 MG tablet Take 1 tablet by mouth daily. 90 tablet 3  . Zinc 50 MG CAPS Take 50 mg by mouth daily.     . clotrimazole-betamethasone (LOTRISONE) cream Apply 1 application topically 2 (two) times daily as needed. (Patient not taking: Reported on 09/03/2020)    . traMADol (ULTRAM) 50 MG tablet Take 1 tablet (50 mg total) by mouth every 6 (six) hours as needed for severe pain. (Patient  not taking: Reported on 09/03/2020) 28 tablet 0   No current facility-administered medications for this visit.    Physical Exam BP (!) 119/59 (BP Location: Right Arm, Patient Position: Sitting)   Pulse 63   Resp 20   Ht 5\' 8"  (1.727 m)   Wt 182 lb (82.6 kg)   SpO2 98% Comment: RA  BMI 27.67 kg/m  70 year old woman in no acute distress Alert and oriented x3 with no focal deficits Incision well-healed, sternum stable Cardiac regular rate and rhythm Lungs clear with equal breath sounds bilaterally   Impression: Vanessa [name] is a 70 year old woman with a history of hypertension, hyperlipidemia, paroxysmal atrial fibrillation, arthritis, gout, anxiety, and depression.  She underwent coronary bypass grafting times 5 in the left atrial appendage clip on 07/10/2020.  From a surgical standpoint she is doing well.  She does have  some occasional pains but overall that is well controlled.  She continues to have problems with vertigo-like symptoms.  The symptoms do not seem to be correlated with position or heart rate or rhythm.  I recommended she check back with Dr. Unk Lightning and maybe see an ENT to see if that would help.  In the meantime I am going to give her a prescription for Antivert.  I am okay with her driving short trips around town but she should not drive on the highway's until these episodes resolve.  There were no restrictions on lifting or other physical activities.  She may begin cardiac rehab.  Plan: Antivert 25 mg p.o. 3 times daily as needed Follow-up with cardiology and Dr. Unk Lightning.  Melrose Nakayama, MD [facility] [phone]

## 2020-09-05 DIAGNOSIS — I4891 Unspecified atrial fibrillation: Secondary | ICD-10-CM | POA: Diagnosis not present

## 2020-09-05 DIAGNOSIS — Z79899 Other long term (current) drug therapy: Secondary | ICD-10-CM | POA: Diagnosis not present

## 2020-09-05 DIAGNOSIS — Z7901 Long term (current) use of anticoagulants: Secondary | ICD-10-CM | POA: Diagnosis not present

## 2020-09-05 DIAGNOSIS — Z951 Presence of aortocoronary bypass graft: Secondary | ICD-10-CM | POA: Diagnosis not present

## 2020-09-05 DIAGNOSIS — I10 Essential (primary) hypertension: Secondary | ICD-10-CM | POA: Diagnosis not present

## 2020-09-13 DIAGNOSIS — Z951 Presence of aortocoronary bypass graft: Secondary | ICD-10-CM | POA: Diagnosis not present

## 2020-09-13 DIAGNOSIS — Z955 Presence of coronary angioplasty implant and graft: Secondary | ICD-10-CM | POA: Diagnosis not present

## 2020-09-16 DIAGNOSIS — I480 Paroxysmal atrial fibrillation: Secondary | ICD-10-CM | POA: Diagnosis not present

## 2020-09-16 DIAGNOSIS — R002 Palpitations: Secondary | ICD-10-CM | POA: Diagnosis not present

## 2020-10-02 NOTE — Telephone Encounter (Signed)
Pt aware Dr. Curt Bears has not yet reviewed. Aware I will have him review Friday and let her know his recommendation that day. Patient verbalized understanding and agreeable to plan.

## 2020-10-11 DIAGNOSIS — Z951 Presence of aortocoronary bypass graft: Secondary | ICD-10-CM | POA: Diagnosis not present

## 2020-10-11 DIAGNOSIS — Z955 Presence of coronary angioplasty implant and graft: Secondary | ICD-10-CM | POA: Diagnosis not present

## 2020-10-21 DIAGNOSIS — E782 Mixed hyperlipidemia: Secondary | ICD-10-CM | POA: Diagnosis not present

## 2020-10-21 DIAGNOSIS — M79672 Pain in left foot: Secondary | ICD-10-CM | POA: Diagnosis not present

## 2020-10-21 DIAGNOSIS — R42 Dizziness and giddiness: Secondary | ICD-10-CM | POA: Diagnosis not present

## 2020-10-21 DIAGNOSIS — M19072 Primary osteoarthritis, left ankle and foot: Secondary | ICD-10-CM | POA: Diagnosis not present

## 2020-10-30 DIAGNOSIS — M19031 Primary osteoarthritis, right wrist: Secondary | ICD-10-CM | POA: Diagnosis not present

## 2020-10-30 DIAGNOSIS — I480 Paroxysmal atrial fibrillation: Secondary | ICD-10-CM | POA: Diagnosis not present

## 2020-10-30 DIAGNOSIS — W19XXXA Unspecified fall, initial encounter: Secondary | ICD-10-CM | POA: Diagnosis not present

## 2020-10-30 DIAGNOSIS — R0781 Pleurodynia: Secondary | ICD-10-CM | POA: Diagnosis not present

## 2020-10-30 DIAGNOSIS — M25532 Pain in left wrist: Secondary | ICD-10-CM | POA: Diagnosis not present

## 2020-10-30 DIAGNOSIS — G8929 Other chronic pain: Secondary | ICD-10-CM | POA: Diagnosis not present

## 2020-10-30 DIAGNOSIS — I471 Supraventricular tachycardia: Secondary | ICD-10-CM | POA: Diagnosis not present

## 2020-10-30 DIAGNOSIS — S20219A Contusion of unspecified front wall of thorax, initial encounter: Secondary | ICD-10-CM | POA: Diagnosis not present

## 2020-10-30 DIAGNOSIS — S20213A Contusion of bilateral front wall of thorax, initial encounter: Secondary | ICD-10-CM | POA: Diagnosis not present

## 2020-10-30 DIAGNOSIS — Z043 Encounter for examination and observation following other accident: Secondary | ICD-10-CM | POA: Diagnosis not present

## 2020-10-30 DIAGNOSIS — M25562 Pain in left knee: Secondary | ICD-10-CM | POA: Diagnosis not present

## 2020-10-30 DIAGNOSIS — Z951 Presence of aortocoronary bypass graft: Secondary | ICD-10-CM | POA: Diagnosis not present

## 2020-10-30 DIAGNOSIS — M25531 Pain in right wrist: Secondary | ICD-10-CM | POA: Diagnosis not present

## 2020-11-02 NOTE — Progress Notes (Signed)
Electrophysiology Office Note   Date:  11/04/2020   ID:  Vanessa [name], DOB 11/09/1950, MRN [id number]  PCP:  Myrlene Broker, MD  Cardiologist:  Burt Knack Primary Electrophysiologist:  Franshesca Chipman [name], MD    No chief complaint on file.    History of Present Illness: Vanessa [name] is a 70 y.o. female who presents today for electrophysiology evaluation.     She has a history of hypertension, hyperlipidemia, and atrial fibrillation.  She was having atypical chest pain.  Coronary CT showed significant coronary artery disease.  She is now status post CABG x5 on 07/10/2020.  Today, denies symptoms of palpitations, chest pain, shortness of breath, orthopnea, PND, lower extremity edema, claudication, dizziness, presyncope, syncope, bleeding, or neurologic sequela. The patient is tolerating medications without difficulties.  Since being seen, she has continued to do well.  She has been working with cardiac rehab, and has had no further episodes of atrial fibrillation.  Unfortunately, she had a fall a week and half ago with bruising of her left knee, left elbow, and ribs.  She has been recovering from this.  She had shoes that were apparently too large.  Past Medical History:  Diagnosis Date   Anxiety    AR (allergic rhinitis)    Arthritis    knees   Atrial fibrillation ([facility])    Cataract    bilateral /removed   Coronary artery disease    Depressed    Dysrhythmia    atrial fibrillation off and on   GERD (gastroesophageal reflux disease)    Glaucoma    HTN (hypertension)    Hyperlipidemia    Overweight(278.02)    Past Surgical History:  Procedure Laterality Date   ABDOMINAL HYSTERECTOMY     CARDIAC CATHETERIZATION     CATARACT EXTRACTION     catherization  11/00   normal   CLIPPING OF ATRIAL APPENDAGE N/A 07/10/2020   Procedure: CLIPPING OF ATRIAL APPENDAGE;  Surgeon: Melrose Nakayama, MD;  Location: [place] [facility];  Service: Open Heart Surgery;  Laterality: N/A;   COLONOSCOPY      CORONARY ARTERY BYPASS GRAFT N/A 07/10/2020   Procedure: CORONARY ARTERY BYPASS GRAFTING (CABG)x5. LEFT INTERNAL MAMMARY ARTERY. RIGHT SAPHENOUS VEIN HARVESTING. LEFT ATRIAL APPENDAGE CLIPPING.;  Surgeon: Melrose Nakayama, MD;  Location: [facility];  Service: Open Heart Surgery;  Laterality: N/A;   cyrotherapy     EP IMPLANTABLE DEVICE N/A 03/02/2016   Procedure: Loop Recorder Insertion;  Surgeon: Rochelle Nephew [name], MD;  Location: [place] [facility];  Service: Cardiovascular;  Laterality: N/A;   EYE SURGERY     x2   GLAUCOMA SURGERY     bilateral   KNEE ARTHROSCOPY     left knee   LEFT HEART CATH AND CORONARY ANGIOGRAPHY N/A 07/01/2020   Procedure: LEFT HEART CATH AND CORONARY ANGIOGRAPHY;  Surgeon: Nelva Bush, MD;  Location: [place] [facility];  Service: Cardiovascular;  Laterality: N/A;   ROTATOR CUFF REPAIR     right shoulder   TEE WITHOUT CARDIOVERSION N/A 07/10/2020   Procedure: TRANSESOPHAGEAL ECHOCARDIOGRAM (TEE);  Surgeon: Melrose Nakayama, MD;  Location: [place];  Service: Open Heart Surgery;  Laterality: N/A;   TONSILLECTOMY     removed as a tennager   WISDOM TOOTH EXTRACTION       Current Outpatient Medications  Medication Sig Dispense Refill   apixaban (ELIQUIS) 5 MG TABS tablet Take 1 tablet (5 mg total) by mouth 2 (two) times daily.     Ascorbic Acid (  VITAMIN C) 1000 MG tablet Take 1,000 mg by mouth daily.     aspirin EC 81 MG tablet Take 1 tablet (81 mg total) by mouth daily. Swallow whole.     Calcium Carbonate-Vitamin D (CALTRATE 600+D PO) Take 600 mg by mouth daily.     Cholecalciferol (VITAMIN D3) 50 MCG (2000 UT) TABS Take 2,000 Units by mouth daily.     clotrimazole-betamethasone (LOTRISONE) cream Apply 1 application topically 2 (two) times daily as needed.     diltiazem (CARDIZEM CD) 180 MG 24 hr capsule Take 1 capsule (180 mg total) by mouth daily. 90 capsule 3   esomeprazole (NEXIUM) 40 MG capsule Take 1 capsule (40 mg total) by mouth daily. 30  capsule 6   meclizine (ANTIVERT) 25 MG tablet Take 1 tablet (25 mg total) by mouth 3 (three) times daily as needed for dizziness. 30 tablet 0   Multiple Vitamins-Minerals (ALIVE ONCE DAILY WOMENS 50+) TABS Take 1 tablet by mouth daily.     Polyethylene Glycol 400 (BLINK TEARS) 0.25 % SOLN Place 1 drop into both eyes daily as needed (Dry eyes).     rosuvastatin (CRESTOR) 10 MG tablet Take 1 tablet (10 mg total) by mouth daily. 90 tablet 3   traMADol (ULTRAM) 50 MG tablet Take 1 tablet (50 mg total) by mouth every 6 (six) hours as needed for severe pain. 28 tablet 0   valsartan-hydrochlorothiazide (DIOVAN HCT) 160-12.5 MG tablet Take 1 tablet by mouth daily. 90 tablet 3   Zinc 50 MG CAPS Take 50 mg by mouth daily.      Current Facility-Administered Medications  Medication Dose Route Frequency Provider Last Rate Last Admin   meclizine (ANTIVERT) tablet 25 mg  25 mg Oral TID PRN Melrose Nakayama, MD        Allergies:   Codeine   Social History:  The patient  reports that she has never smoked. She has never used smokeless tobacco. She reports that she does not drink alcohol and does not use drugs.   Family History:  The patient's family history includes Heart attack in her father; Stroke in her mother.   ROS:  Please see the history of present illness.   Otherwise, review of systems is positive for none.   All other systems are reviewed and negative.   PHYSICAL EXAM: VS:  BP (!) 122/58   Pulse 64   Ht '5\' 8"'$  (1.727 m)   Wt 182 lb (82.6 kg)   SpO2 97%   BMI 27.67 kg/m  , BMI Body mass index is 27.67 kg/m. GEN: Well nourished, well developed, in no acute distress  HEENT: normal  Neck: no JVD, carotid bruits, or masses Cardiac: RRR; no murmurs, rubs, or gallops,no edema  Respiratory:  clear to auscultation bilaterally, normal work of breathing GI: soft, nontender, nondistended, + BS MS: no deformity or atrophy  Skin: warm and dry, device site well healed Neuro:  Strength and  sensation are intact Psych: euthymic mood, full affect  EKG:  EKG is ordered today. Personal review of the ekg ordered shows sinus rhythm, rate 64  Personal review of the device interrogation today. Results in [place]: 07/09/2020: ALT 18 07/11/2020: Magnesium 2.2 07/15/2020: BUN 21; Creatinine, Ser 0.96; Hemoglobin 10.2; Platelets 175; Potassium 3.6; Sodium 137    Lipid Panel     Component Value Date/Time   CHOL 129 05/20/2020 1216   TRIG 145 05/20/2020 1216   HDL 46 05/20/2020 1216   CHOLHDL 2.8  05/20/2020 1216   LDLCALC 58 05/20/2020 1216     Wt Readings from Last 3 Encounters:  11/04/20 182 lb (82.6 kg)  09/03/20 182 lb (82.6 kg)  07/31/20 182 lb (82.6 kg)      Other studies Reviewed: Additional studies/ records that were reviewed today include: TTE 02/19/16  Review of the above records today demonstrates:  - Left ventricle: The cavity size was normal. There was mild focal   basal hypertrophy of the septum. Systolic function was normal.   The estimated ejection fraction was in the range of 55% to 60%.   Wall motion was normal; there were no regional wall motion   abnormalities. Doppler parameters are consistent with abnormal   left ventricular relaxation (grade 1 diastolic dysfunction). - Mitral valve: There was mild regurgitation. - Left atrium: The atrium was mildly dilated.   Cardiac monitor 09/18/2020 personally reviewed Max 214 bpm 10:41am, 05/28 Min 48 bpm 06:17am, 05/24 Avg 68 bpm Less than 1% supraventricular ectopy 7.1% ventricular ectopy Predominant underlying rhythm was sinus rhythm 40 runs of SVT, longest 11.7 seconds 1 run of ventricular tachycardia, 9 beats Triggered events and symptoms associated with sinus rhythm  ASSESSMENT AND PLAN:  1.  Paroxysmal atrial fibrillation: Currently on Eliquis.  CHA2DS2-VASc of 3.  Status post left atrial appendage clipping.  Recent cardiac monitor showed no episodes of atrial fibrillation.  ECG today  is normal.  No changes at this time.  2.  Hypertension: Currently well controlled  3.  Coronary artery disease: Status post CABG x5 07/10/2020.  Had a left atrial appendage clip at the time.  No current chest pain.  Current medicines are reviewed at length with the patient today.   The patient does not have concerns regarding her medicines.  The following changes were made today: None  Labs/ tests ordered today include:  Orders Placed This Encounter  Procedures   EKG 12-Lead      Disposition:   FU with Elektra Wartman 6 months  Signed, Kinte Trim [name], MD  11/04/2020 10:55 AM     [address] [place] [address] [phone] (office) [phone] (fax)

## 2020-11-04 ENCOUNTER — Other Ambulatory Visit: Payer: Self-pay

## 2020-11-04 ENCOUNTER — Encounter: Payer: Self-pay | Admitting: Cardiology

## 2020-11-04 ENCOUNTER — Ambulatory Visit: Payer: PPO | Admitting: Cardiology

## 2020-11-04 VITALS — BP 122/58 | HR 64 | Ht 68.0 in | Wt 182.0 lb

## 2020-11-04 DIAGNOSIS — I480 Paroxysmal atrial fibrillation: Secondary | ICD-10-CM

## 2020-11-04 NOTE — Patient Instructions (Signed)
Medication Instructions:  Your physician recommends that you continue on your current medications as directed. Please refer to the Current Medication list given to you today.  *If you need a refill on your cardiac medications before your next appointment, please call your pharmacy*   Lab Work: None ordered If you have labs (blood work) drawn today and your tests are completely normal, you will receive your results only by: [facility] (if you have MyChart) OR A paper copy in the mail If you have any lab test that is abnormal or we need to change your treatment, we will call you to review the results.   Testing/Procedures: None ordered   Follow-Up: At [facility], you and your health needs are our priority.  As part of our continuing mission to provide you with exceptional heart care, we have created designated Provider Care Teams.  These Care Teams include your primary Cardiologist (physician) and Advanced Practice Providers (APPs -  Physician Assistants and Nurse Practitioners) who all work together to provide you with the care you need, when you need it.  We recommend signing up for the patient portal called "MyChart".  Sign up information is provided on this After Visit Summary.  MyChart is used to connect with patients for Virtual Visits (Telemedicine).  Patients are able to view lab/test results, encounter notes, upcoming appointments, etc.  Non-urgent messages can be sent to your provider as well.   To learn more about what you can do with MyChart, go to [email or web address].    Your next appointment:   6 month(s)  The format for your next appointment:   In Person  Provider:   Allegra Lai, MD    Thank you for choosing [facility]!!   Trinidad Curet, RN [phone]   Other Instructions Will have office call you to get you in for follow up wit general cardiologist

## 2020-11-11 DIAGNOSIS — Z951 Presence of aortocoronary bypass graft: Secondary | ICD-10-CM | POA: Diagnosis not present

## 2020-11-13 DIAGNOSIS — L821 Other seborrheic keratosis: Secondary | ICD-10-CM | POA: Diagnosis not present

## 2020-11-13 DIAGNOSIS — L814 Other melanin hyperpigmentation: Secondary | ICD-10-CM | POA: Diagnosis not present

## 2020-11-13 DIAGNOSIS — D485 Neoplasm of uncertain behavior of skin: Secondary | ICD-10-CM | POA: Diagnosis not present

## 2020-11-13 DIAGNOSIS — D225 Melanocytic nevi of trunk: Secondary | ICD-10-CM | POA: Diagnosis not present

## 2020-11-13 DIAGNOSIS — D2239 Melanocytic nevi of other parts of face: Secondary | ICD-10-CM | POA: Diagnosis not present

## 2020-12-23 ENCOUNTER — Ambulatory Visit: Payer: PPO | Admitting: Cardiology

## 2020-12-25 ENCOUNTER — Other Ambulatory Visit: Payer: Self-pay

## 2020-12-25 MED ORDER — VALSARTAN-HYDROCHLOROTHIAZIDE 160-12.5 MG PO TABS: 1.0000 | ORAL_TABLET | Freq: Every day | ORAL | 3 refills | Status: DC

## 2021-01-06 DIAGNOSIS — H402212 Chronic angle-closure glaucoma, right eye, moderate stage: Secondary | ICD-10-CM | POA: Diagnosis not present

## 2021-01-15 ENCOUNTER — Other Ambulatory Visit: Payer: Self-pay | Admitting: *Deleted

## 2021-01-15 MED ORDER — ROSUVASTATIN CALCIUM 10 MG PO TABS: 10.0000 mg | ORAL_TABLET | Freq: Every day | ORAL | 3 refills | Status: DC

## 2021-01-20 ENCOUNTER — Other Ambulatory Visit: Payer: Self-pay

## 2021-01-20 MED ORDER — DILTIAZEM HCL ER COATED BEADS 180 MG PO CP24: 180.0000 mg | ORAL_CAPSULE | Freq: Every day | ORAL | 2 refills | Status: DC

## 2021-02-21 DIAGNOSIS — D649 Anemia, unspecified: Secondary | ICD-10-CM | POA: Diagnosis not present

## 2021-02-21 DIAGNOSIS — E782 Mixed hyperlipidemia: Secondary | ICD-10-CM | POA: Diagnosis not present

## 2021-02-21 DIAGNOSIS — Z0000 Encounter for general adult medical examination without abnormal findings: Secondary | ICD-10-CM | POA: Diagnosis not present

## 2021-02-21 DIAGNOSIS — I10 Essential (primary) hypertension: Secondary | ICD-10-CM | POA: Diagnosis not present

## 2021-02-21 DIAGNOSIS — K219 Gastro-esophageal reflux disease without esophagitis: Secondary | ICD-10-CM | POA: Diagnosis not present

## 2021-02-26 DIAGNOSIS — R922 Inconclusive mammogram: Secondary | ICD-10-CM | POA: Diagnosis not present

## 2021-02-26 DIAGNOSIS — N644 Mastodynia: Secondary | ICD-10-CM | POA: Diagnosis not present

## 2021-03-05 DIAGNOSIS — R109 Unspecified abdominal pain: Secondary | ICD-10-CM | POA: Diagnosis not present

## 2021-03-05 DIAGNOSIS — K7689 Other specified diseases of liver: Secondary | ICD-10-CM | POA: Diagnosis not present

## 2021-03-21 ENCOUNTER — Other Ambulatory Visit: Payer: Self-pay

## 2021-03-21 ENCOUNTER — Telehealth: Payer: Self-pay

## 2021-03-21 ENCOUNTER — Encounter: Payer: Self-pay | Admitting: Gastroenterology

## 2021-03-21 ENCOUNTER — Ambulatory Visit (INDEPENDENT_AMBULATORY_CARE_PROVIDER_SITE_OTHER): Payer: PPO | Admitting: Gastroenterology

## 2021-03-21 VITALS — BP 130/78 | HR 62 | Ht 68.0 in | Wt 193.0 lb

## 2021-03-21 DIAGNOSIS — R1013 Epigastric pain: Secondary | ICD-10-CM | POA: Diagnosis not present

## 2021-03-21 DIAGNOSIS — R1031 Right lower quadrant pain: Secondary | ICD-10-CM | POA: Diagnosis not present

## 2021-03-21 DIAGNOSIS — R9389 Abnormal findings on diagnostic imaging of other specified body structures: Secondary | ICD-10-CM

## 2021-03-21 NOTE — Telephone Encounter (Signed)
[facility] Pre-operative Risk Assessment     Request for surgical clearance:     Endoscopy Procedure  What type of surgery is being performed?     EGD  When is this surgery scheduled?     05-14-2021  What type of clearance is required ?   Pharmacy  Are there any medications that need to be held prior to surgery and how long? Eliquis 24 hour hold  Practice name and name of physician performing surgery?      [facility]  What is your office phone and fax number?      Phone- [phone]  [phone]  Anesthesia type (None, local, MAC, general) ?       MAC

## 2021-03-21 NOTE — Progress Notes (Signed)
Chief Complaint: abn CT  Referring Provider:  Myrlene Broker, MD      ASSESSMENT AND PLAN;   #1. Abn CT showing gastric wall thickening 02/2021.  #2. GERD. #3. Paroxysmal A fib on Eliquis. #4. RLQ pain (likely musculoskeletal), neg CT AP 02/2021 for any etiology. Neg colon 08/2018  Plan:  -EGD off eliquis x 24hrs after Cardiology clearence from Dr Curt Bears. -Continue nexium 40mg  po qd. -I have reassured patient.    HPI:    Vanessa [name] is a 70 y.o. female   A. fib on Eliquis, recent CABG 06/2020 (nl EF), pacemaker, HTN, HLD, anxiety/depression  With abn CT AP 02/2021 showing gastric wall thickening.  Advised EGD  He denies having any significant upper GI symptoms.  Has history of heartburn which is better with Nexium.  She denies having any odynophagia or dysphagia.  She did have right lower quadrant abdominal pain.  Negative CT Abdo/pelvis for any etiology.  It is likely musculoskeletal or related to back problems.  She does have 2-3 softer bowel movements per day associated with abdominal bloating and occasional lower abdominal discomfort which gets better with defecation.  No melena or hematochezia.  No weight loss or loss of appetite.  S/P CABG 06/2020  Prev GI procedures: Colonoscopy 08/2018 - Mild pancolonic diverticulosis - Small internal hemorrhoids (likely etiology of rectal bleeding). No active bleeding. - Otherwise normal colonoscopy to TI. - Rpt 10 yrs.  -EGD 08/2018 - Small hiatal hernia. - Mild gastritis. Biopsied.  CT 03/05/2021 1. Gastric wall thickening, possible infectious or inflammatory gastritis. Endoscopy is suggested for further evaluation on follow-up to exclude underlying neoplasm.. 2. Hepatic cyst. 3. 5 mm ground-glass nodule in the right lower lobe. No follow-up recommended. This recommendation follows the consensus statement: Guidelines for Management of Incidental Pulmonary Nodules Detected on CT Images: From the Fleischner Society  2017; Radiology 2017; 284:228-243. 4. Remaining chronic findings as described above.   [place] cath 06/2020 (prior to CABG) Severe three-vessel coronary artery disease, including 80-90% proximal/mid LAD stenosis, 80% proximal D1 lesion, 95% proximal ramus stenosis, 70% large OM1 stenosis, and 95% disease involving small, codominant RCA. Normal left ventricular contraction and filling pressure. Past Medical History:  Diagnosis Date   Anxiety    AR (allergic rhinitis)    Arthritis    knees   Atrial fibrillation (HCC)    Cataract    bilateral /removed   Coronary artery disease    Depressed    Dysrhythmia    atrial fibrillation off and on   GERD (gastroesophageal reflux disease)    Glaucoma    HTN (hypertension)    Hyperlipidemia    Overweight(278.02)     Past Surgical History:  Procedure Laterality Date   ABDOMINAL HYSTERECTOMY     CARDIAC CATHETERIZATION     CATARACT EXTRACTION     catherization  11/00   normal   CLIPPING OF ATRIAL APPENDAGE N/A 07/10/2020   Procedure: CLIPPING OF ATRIAL APPENDAGE;  Surgeon: Melrose Nakayama, MD;  Location: [place];  Service: Open Heart Surgery;  Laterality: N/A;   COLONOSCOPY     CORONARY ARTERY BYPASS GRAFT N/A 07/10/2020   Procedure: CORONARY ARTERY BYPASS GRAFTING (CABG)x5. LEFT INTERNAL MAMMARY ARTERY. RIGHT SAPHENOUS VEIN HARVESTING. LEFT ATRIAL APPENDAGE CLIPPING.;  Surgeon: Melrose Nakayama, MD;  Location: [place];  Service: Open Heart Surgery;  Laterality: N/A;   cyrotherapy     EP IMPLANTABLE DEVICE N/A 03/02/2016   Procedure: Loop Recorder Insertion;  Surgeon: [employer],  MD;  Location: [facility];  Service: Cardiovascular;  Laterality: N/A;   EYE SURGERY     x2   GLAUCOMA SURGERY     bilateral   KNEE ARTHROSCOPY     left knee   LEFT HEART CATH AND CORONARY ANGIOGRAPHY N/A 07/01/2020   Procedure: LEFT HEART CATH AND CORONARY ANGIOGRAPHY;  Surgeon: Nelva Bush, MD;  Location: [place] [facility];  Service:  Cardiovascular;  Laterality: N/A;   ROTATOR CUFF REPAIR     right shoulder   TEE WITHOUT CARDIOVERSION N/A 07/10/2020   Procedure: TRANSESOPHAGEAL ECHOCARDIOGRAM (TEE);  Surgeon: Melrose Nakayama, MD;  Location: [place];  Service: Open Heart Surgery;  Laterality: N/A;   TONSILLECTOMY     removed as a tennager   WISDOM TOOTH EXTRACTION      Family History  Problem Relation Age of Onset   Stroke Mother    Heart attack Father    Colon cancer Neg Hx    Colon polyps Neg Hx    Esophageal cancer Neg Hx    Rectal cancer Neg Hx    Stomach cancer Neg Hx     Social History   Tobacco Use   Smoking status: Never   Smokeless tobacco: Never  Vaping Use   Vaping Use: Never used  Substance Use Topics   Alcohol use: No   Drug use: No    Current Outpatient Medications  Medication Sig Dispense Refill   apixaban (ELIQUIS) 5 MG TABS tablet Take 1 tablet (5 mg total) by mouth 2 (two) times daily.     Ascorbic Acid (VITAMIN C) 1000 MG tablet Take 1,000 mg by mouth daily.     aspirin EC 81 MG tablet Take 1 tablet (81 mg total) by mouth daily. Swallow whole.     Calcium Carbonate-Vitamin D (CALTRATE 600+D PO) Take 600 mg by mouth daily.     Cholecalciferol (VITAMIN D3) 50 MCG (2000 UT) TABS Take 2,000 Units by mouth daily.     clotrimazole-betamethasone (LOTRISONE) cream Apply 1 application topically 2 (two) times daily as needed.     diltiazem (CARDIZEM CD) 180 MG 24 hr capsule Take 1 capsule (180 mg total) by mouth daily. 90 capsule 2   esomeprazole (NEXIUM) 40 MG capsule Take 1 capsule (40 mg total) by mouth daily. 30 capsule 6   meclizine (ANTIVERT) 25 MG tablet Take 1 tablet (25 mg total) by mouth 3 (three) times daily as needed for dizziness. 30 tablet 0   Multiple Vitamins-Minerals (ALIVE ONCE DAILY WOMENS 50+) TABS Take 1 tablet by mouth daily.     Polyethylene Glycol 400 (BLINK TEARS) 0.25 % SOLN Place 1 drop into both eyes daily as needed (Dry eyes).     rosuvastatin (CRESTOR) 10 MG  tablet Take 1 tablet (10 mg total) by mouth daily. 90 tablet 3   valsartan-hydrochlorothiazide (DIOVAN HCT) 160-12.5 MG tablet Take 1 tablet by mouth daily. 90 tablet 3   Zinc 50 MG CAPS Take 50 mg by mouth daily.      Current Facility-Administered Medications  Medication Dose Route Frequency Provider Last Rate Last Admin   meclizine (ANTIVERT) tablet 25 mg  25 mg Oral TID PRN Melrose Nakayama, MD        Allergies  Allergen Reactions   Codeine Nausea Only    nasuea    Review of Systems:  neg     Physical Exam:    BP 130/78 (BP Location: Right Arm, Patient Position: Sitting, Cuff Size: Normal)  Pulse 62   Ht 5\' 8"  (1.727 m)   Wt 193 lb (87.5 kg)   SpO2 97%   BMI 29.35 kg/m  Filed Weights   03/21/21 1553  Weight: 193 lb (87.5 kg)   Gen: awake, alert, NAD HEENT: anicteric, no pallor CV: RRR, no mrg Pulm: CTA b/l Abd: soft, NT/ND, +BS throughout Ext: no c/c/e Neuro: nonfocal   Data Reviewed: I have personally reviewed following labs and imaging studies  CBC: CBC Latest Ref Rng & Units 07/15/2020 07/13/2020 07/12/2020  WBC 4.0 - 10.5 K/uL 10.6(H) 12.4(H) 13.2(H)  Hemoglobin 12.0 - 15.0 g/dL 10.2(L) 9.3(L) 9.7(L)  Hematocrit 36.0 - 46.0 % 31.8(L) 28.8(L) 30.1(L)  Platelets 150 - 400 K/uL 175 87(L) 86(L)    CMP: CMP Latest Ref Rng & Units 07/15/2020 07/13/2020 07/12/2020  Glucose 70 - 99 mg/dL 152(H) 128(H) 86  BUN 8 - 23 mg/dL 21 25(H) 19  Creatinine 0.44 - 1.00 mg/dL 0.96 1.23(H) 0.92  Sodium 135 - 145 mmol/L 137 132(L) 133(L)  Potassium 3.5 - 5.1 mmol/L 3.6 4.4 4.3  Chloride 98 - 111 mmol/L 99 100 99  CO2 22 - 32 mmol/L 29 28 30   Calcium 8.9 - 10.3 mg/dL 9.2 8.3(L) 8.3(L)  Total Protein 6.5 - 8.1 g/dL - - -  Total Bilirubin 0.3 - 1.2 mg/dL - - -  Alkaline Phos 38 - 126 U/L - - -  AST 15 - 41 U/L - - -  ALT 0 - 44 U/L - - -      Carmell Austria, MD 03/21/2021, 4:23 PM  Cc: Myrlene Broker, MD

## 2021-03-21 NOTE — Patient Instructions (Addendum)
If you are age 72 or older, your body mass index should be between 23-30. Your Body mass index is 29.35 kg/m. If this is out of the aforementioned range listed, please consider follow up with your Primary Care Provider.  If you are age 63 or younger, your body mass index should be between 19-25. Your Body mass index is 29.35 kg/m. If this is out of the aformentioned range listed, please consider follow up with your Primary Care Provider.   ________________________________________________________  The [facility] providers would like to encourage you to use [employer] to communicate with providers for non-urgent requests or questions.  Due to long hold times on the telephone, sending your provider a message by [facility] may be a faster and more efficient way to get a response.  Please allow 48 business hours for a response.  Please remember that this is for non-urgent requests.  _______________________________________________________  Dennis Bast have been scheduled for an endoscopy. Please follow written instructions given to you at your visit today. If you use inhalers (even only as needed), please bring them with you on the day of your procedure.  You will be contacted by our office prior to your procedure for directions on holding your Eliquis.  If you do not hear from our office 1 week prior to your scheduled procedure, please call [phone] to discuss.   Continue Nexium  Thank you,  Dr. Jackquline Denmark

## 2021-03-21 NOTE — Telephone Encounter (Signed)
Chart reviewed, last OV 10/2020 with Dr. Curt Bears and gen card visit 05/2020 with Truitt Merle, NP. Had CABG 06/2020. Will route to pharm team for input on anticoag then pt will need call to ensure no new changes. Would also likely encourage getting back on track with general cardiology f/u at time as well as she's only seen EP post-CABG.

## 2021-03-24 NOTE — Telephone Encounter (Signed)
Patient with diagnosis of afib on Eliquis for anticoagulation.    Procedure: EGD Date of procedure: 05/14/21  CHA2DS2-VASc Score = 4  This indicates a 4.8% annual risk of stroke. The patient's score is based upon: CHF History: 0 HTN History: 1 Diabetes History: 0 Stroke History: 0 Vascular Disease History: 1 Age Score: 1 Gender Score: 1   CrCl 43mL/min using adjusted body weight Platelet count 178K  Per office protocol, patient can hold Eliquis for 1 day prior to procedure as requested.

## 2021-03-24 NOTE — Telephone Encounter (Signed)
Patient made aware and voiced understanding.

## 2021-03-25 NOTE — Telephone Encounter (Signed)
Please send the patient a gentle reminder to schedule follow-up with general cardiology service Dr. Burt Knack.  The patient has been seen by Dr. Curt Bears in July however has not been seen by any general cardiologist since her bypass surgery in March 2022.  This appointment does not necessarily need to be before the GI procedure.  If she prefers, we can schedule it after her EGD.

## 2021-03-26 NOTE — Telephone Encounter (Signed)
Call placed to pt.  She has been scheduled to see Dr. Burt Knack 07/15/21.  She was thankful for the call.

## 2021-05-14 ENCOUNTER — Other Ambulatory Visit: Payer: Self-pay

## 2021-05-14 ENCOUNTER — Encounter: Payer: Self-pay | Admitting: Gastroenterology

## 2021-05-14 ENCOUNTER — Ambulatory Visit (AMBULATORY_SURGERY_CENTER): Payer: PPO | Admitting: Gastroenterology

## 2021-05-14 VITALS — BP 151/57 | HR 55 | Temp 96.4°F | Resp 20 | Ht 68.0 in | Wt 193.0 lb

## 2021-05-14 DIAGNOSIS — K449 Diaphragmatic hernia without obstruction or gangrene: Secondary | ICD-10-CM | POA: Diagnosis not present

## 2021-05-14 DIAGNOSIS — R1031 Right lower quadrant pain: Secondary | ICD-10-CM

## 2021-05-14 DIAGNOSIS — K2970 Gastritis, unspecified, without bleeding: Secondary | ICD-10-CM | POA: Diagnosis not present

## 2021-05-14 DIAGNOSIS — R9389 Abnormal findings on diagnostic imaging of other specified body structures: Secondary | ICD-10-CM | POA: Diagnosis not present

## 2021-05-14 DIAGNOSIS — R1013 Epigastric pain: Secondary | ICD-10-CM

## 2021-05-14 MED ORDER — SODIUM CHLORIDE 0.9 % IV SOLN: 500.0000 mL | Freq: Once | INTRAVENOUS | Status: DC

## 2021-05-14 NOTE — Progress Notes (Signed)
VS-DT 

## 2021-05-14 NOTE — Progress Notes (Signed)
Pt in recovery with monitors in place, VSS. Report given to receiving RN. Bite guard was placed with pt awake to ensure comfort. No dental or soft tissue damage noted. 

## 2021-05-14 NOTE — Progress Notes (Signed)
Chief Complaint: abn CT  Referring Provider:  Myrlene Broker, MD      ASSESSMENT AND PLAN;   #1. Abn CT showing gastric wall thickening 02/2021.  #2. GERD. #3. Paroxysmal A fib on Eliquis. #4. RLQ pain (likely musculoskeletal), neg CT AP 02/2021 for any etiology. Neg colon 08/2018  Plan:  -EGD off eliquis x 24hrs after Cardiology clearence from Dr Curt Bears. -Continue nexium 40mg  po qd. -I have reassured patient.    HPI:    Vanessa [name] is a 71 y.o. female   A. fib on Eliquis, recent CABG 06/2020 (nl EF), pacemaker, HTN, HLD, anxiety/depression  With abn CT AP 02/2021 showing gastric wall thickening.  Advised EGD  He denies having any significant upper GI symptoms.  Has history of heartburn which is better with Nexium.  She denies having any odynophagia or dysphagia.  She did have right lower quadrant abdominal pain.  Negative CT Abdo/pelvis for any etiology.  It is likely musculoskeletal or related to back problems.  She does have 2-3 softer bowel movements per day associated with abdominal bloating and occasional lower abdominal discomfort which gets better with defecation.  No melena or hematochezia.  No weight loss or loss of appetite.  S/P CABG 06/2020  Prev GI procedures: Colonoscopy 08/2018 - Mild pancolonic diverticulosis - Small internal hemorrhoids (likely etiology of rectal bleeding). No active bleeding. - Otherwise normal colonoscopy to TI. - Rpt 10 yrs.  -EGD 08/2018 - Small hiatal hernia. - Mild gastritis. Biopsied.  CT 03/05/2021 1. Gastric wall thickening, possible infectious or inflammatory gastritis. Endoscopy is suggested for further evaluation on follow-up to exclude underlying neoplasm.. 2. Hepatic cyst. 3. 5 mm ground-glass nodule in the right lower lobe. No follow-up recommended. This recommendation follows the consensus statement: Guidelines for Management of Incidental Pulmonary Nodules Detected on CT Images: From the Fleischner Society  2017; Radiology 2017; 284:228-243. 4. Remaining chronic findings as described above.   [place] cath 06/2020 (prior to CABG) Severe three-vessel coronary artery disease, including 80-90% proximal/mid LAD stenosis, 80% proximal D1 lesion, 95% proximal ramus stenosis, 70% large OM1 stenosis, and 95% disease involving small, codominant RCA. Normal left ventricular contraction and filling pressure. Past Medical History:  Diagnosis Date   Anemia    Anxiety    AR (allergic rhinitis)    Arthritis    knees   Atrial fibrillation (HCC)    Blood transfusion without reported diagnosis    Cataract    bilateral /removed   Coronary artery disease    Depressed    Dysrhythmia    atrial fibrillation off and on   GERD (gastroesophageal reflux disease)    Glaucoma    HTN (hypertension)    Hyperlipidemia    Overweight(278.02)     Past Surgical History:  Procedure Laterality Date   ABDOMINAL HYSTERECTOMY     CARDIAC CATHETERIZATION     CATARACT EXTRACTION     catherization  02/1999   normal   CLIPPING OF ATRIAL APPENDAGE N/A 07/10/2020   Procedure: CLIPPING OF ATRIAL APPENDAGE;  Surgeon: Melrose Nakayama, MD;  Location: [place];  Service: Open Heart Surgery;  Laterality: N/A;   COLONOSCOPY     CORONARY ARTERY BYPASS GRAFT N/A 07/10/2020   Procedure: CORONARY ARTERY BYPASS GRAFTING (CABG)x5. LEFT INTERNAL MAMMARY ARTERY. RIGHT SAPHENOUS VEIN HARVESTING. LEFT ATRIAL APPENDAGE CLIPPING.;  Surgeon: Melrose Nakayama, MD;  Location: [place];  Service: Open Heart Surgery;  Laterality: N/A;   cyrotherapy     EP IMPLANTABLE DEVICE N/A  03/02/2016   Procedure: Loop Recorder Insertion;  Surgeon: Will [name], MD;  Location: [place] [facility];  Service: Cardiovascular;  Laterality: N/A;   EYE SURGERY     x2   GLAUCOMA SURGERY     bilateral   KNEE ARTHROSCOPY     left knee   LEFT HEART CATH AND CORONARY ANGIOGRAPHY N/A 07/01/2020   Procedure: LEFT HEART CATH AND CORONARY ANGIOGRAPHY;  Surgeon:  Nelva Bush, MD;  Location: [place] [facility];  Service: Cardiovascular;  Laterality: N/A;   ROTATOR CUFF REPAIR     right shoulder   TEE WITHOUT CARDIOVERSION N/A 07/10/2020   Procedure: TRANSESOPHAGEAL ECHOCARDIOGRAM (TEE);  Surgeon: Melrose Nakayama, MD;  Location: [place];  Service: Open Heart Surgery;  Laterality: N/A;   TONSILLECTOMY     removed as a tennager   UPPER GASTROINTESTINAL ENDOSCOPY     WISDOM TOOTH EXTRACTION      Family History  Problem Relation Age of Onset   Stroke Mother    Heart attack Father    Colon cancer Neg Hx    Colon polyps Neg Hx    Esophageal cancer Neg Hx    Rectal cancer Neg Hx    Stomach cancer Neg Hx     Social History   Tobacco Use   Smoking status: Never   Smokeless tobacco: Never  Vaping Use   Vaping Use: Never used  Substance Use Topics   Alcohol use: No    Comment: very rarely   Drug use: No    Current Outpatient Medications  Medication Sig Dispense Refill   apixaban (ELIQUIS) 5 MG TABS tablet Take 1 tablet (5 mg total) by mouth 2 (two) times daily.     Ascorbic Acid (VITAMIN C) 1000 MG tablet Take 1,000 mg by mouth daily.     aspirin EC 81 MG tablet Take 1 tablet (81 mg total) by mouth daily. Swallow whole.     Calcium Carbonate-Vitamin D (CALTRATE 600+D PO) Take 600 mg by mouth daily.     Cholecalciferol (VITAMIN D3) 50 MCG (2000 UT) TABS Take 2,000 Units by mouth daily.     diltiazem (CARDIZEM CD) 180 MG 24 hr capsule Take 1 capsule (180 mg total) by mouth daily. 90 capsule 2   esomeprazole (NEXIUM) 40 MG capsule Take 1 capsule (40 mg total) by mouth daily. 30 capsule 6   valsartan-hydrochlorothiazide (DIOVAN HCT) 160-12.5 MG tablet Take 1 tablet by mouth daily. 90 tablet 3   clotrimazole-betamethasone (LOTRISONE) cream Apply 1 application topically 2 (two) times daily as needed.     meclizine (ANTIVERT) 25 MG tablet Take 1 tablet (25 mg total) by mouth 3 (three) times daily as needed for dizziness. 30 tablet 0    Multiple Vitamins-Minerals (ALIVE ONCE DAILY WOMENS 50+) TABS Take 1 tablet by mouth daily.     Polyethylene Glycol 400 (BLINK TEARS) 0.25 % SOLN Place 1 drop into both eyes daily as needed (Dry eyes).     rosuvastatin (CRESTOR) 10 MG tablet Take 1 tablet (10 mg total) by mouth daily. 90 tablet 3   Zinc 50 MG CAPS Take 50 mg by mouth daily.      Current Facility-Administered Medications  Medication Dose Route Frequency Provider Last Rate Last Admin   0.9 %  sodium chloride infusion  500 mL Intravenous Once Jackquline Denmark, MD        Allergies  Allergen Reactions   Codeine Nausea Only    nasuea    Review of Systems:  neg     Physical Exam:    BP (!) 137/59    Pulse 60    Temp (!) 96.4 F (35.8 C) (Temporal)    Ht 5\' 8"  (1.727 m)    Wt 193 lb (87.5 kg)    SpO2 100%    BMI 29.35 kg/m  Filed Weights   05/14/21 0906  Weight: 193 lb (87.5 kg)   Gen: awake, alert, NAD HEENT: anicteric, no pallor CV: RRR, no mrg Pulm: CTA b/l Abd: soft, NT/ND, +BS throughout Ext: no c/c/e Neuro: nonfocal   Data Reviewed: I have personally reviewed following labs and imaging studies  CBC: CBC Latest Ref Rng & Units 07/15/2020 07/13/2020 07/12/2020  WBC 4.0 - 10.5 K/uL 10.6(H) 12.4(H) 13.2(H)  Hemoglobin 12.0 - 15.0 g/dL 10.2(L) 9.3(L) 9.7(L)  Hematocrit 36.0 - 46.0 % 31.8(L) 28.8(L) 30.1(L)  Platelets 150 - 400 K/uL 175 87(L) 86(L)    CMP: CMP Latest Ref Rng & Units 07/15/2020 07/13/2020 07/12/2020  Glucose 70 - 99 mg/dL 152(H) 128(H) 86  BUN 8 - 23 mg/dL 21 25(H) 19  Creatinine 0.44 - 1.00 mg/dL 0.96 1.23(H) 0.92  Sodium 135 - 145 mmol/L 137 132(L) 133(L)  Potassium 3.5 - 5.1 mmol/L 3.6 4.4 4.3  Chloride 98 - 111 mmol/L 99 100 99  CO2 22 - 32 mmol/L 29 28 30   Calcium 8.9 - 10.3 mg/dL 9.2 8.3(L) 8.3(L)  Total Protein 6.5 - 8.1 g/dL - - -  Total Bilirubin 0.3 - 1.2 mg/dL - - -  Alkaline Phos 38 - 126 U/L - - -  AST 15 - 41 U/L - - -  ALT 0 - 44 U/L - - -      Carmell Austria, MD 05/14/2021, 9:24  AM  Cc: Myrlene Broker, MD

## 2021-05-14 NOTE — Progress Notes (Signed)
Called to room to assist during endoscopic procedure.  Patient ID and intended procedure confirmed with present staff. Received instructions for my participation in the procedure from the performing physician.  

## 2021-05-14 NOTE — Patient Instructions (Signed)
RESUME ELIQUIS (PRIOR DOSE ) TOMORROW 05/15/21.  HANDOUTS ON HIATAL HERNIA & GASTRITIS GIVEN TO YOU TODAY  AWAIT RESULTS OF GASTRIC BIOPSIES DONE     YOU HAD AN ENDOSCOPIC PROCEDURE TODAY AT [facility]:   Refer to the procedure report that was given to you for any specific questions about what was found during the examination.  If the procedure report does not answer your questions, please call your gastroenterologist to clarify.  If you requested that your care partner not be given the details of your procedure findings, then the procedure report has been included in a sealed envelope for you to review at your convenience later.  YOU SHOULD EXPECT: Some feelings of bloating in the abdomen. Passage of more gas than usual.  Walking can help get rid of the air that was put into your GI tract during the procedure and reduce the bloating. If you had a lower endoscopy (such as a colonoscopy or flexible sigmoidoscopy) you may notice spotting of blood in your stool or on the toilet paper. If you underwent a bowel prep for your procedure, you may not have a normal bowel movement for a few days.  Please Note:  You might notice some irritation and congestion in your nose or some drainage.  This is from the oxygen used during your procedure.  There is no need for concern and it should clear up in a day or so.  SYMPTOMS TO REPORT IMMEDIATELY:   Following upper endoscopy (EGD)  Vomiting of blood or coffee ground material  New chest pain or pain under the shoulder blades  Painful or persistently difficult swallowing  New shortness of breath  Fever of 100F or higher  Black, tarry-looking stools  For urgent or emergent issues, a gastroenterologist can be reached at any hour by calling [phone]. Do not use MyChart messaging for urgent concerns.    DIET:  We do recommend a small meal at first, but then you may proceed to your regular diet.  Drink plenty of fluids but you  should avoid alcoholic beverages for 24 hours.  ACTIVITY:  You should plan to take it easy for the rest of today and you should NOT DRIVE or use heavy machinery until tomorrow (because of the sedation medicines used during the test).    FOLLOW UP: Our staff will call the number listed on your records 48-72 hours following your procedure to check on you and address any questions or concerns that you may have regarding the information given to you following your procedure. If we do not reach you, we will leave a message.  We will attempt to reach you two times.  During this call, we will ask if you have developed any symptoms of COVID 19. If you develop any symptoms (ie: fever, flu-like symptoms, shortness of breath, cough etc.) before then, please call [phone].  If you test positive for Covid 19 in the 2 weeks post procedure, please call and report this information to [place].    If any biopsies were taken you will be contacted by phone or by letter within the next 1-3 weeks.  Please call us at [phone] if you have not heard about the biopsies in 3 weeks.    SIGNATURES/CONFIDENTIALITY: You and/or your care partner have signed paperwork which will be entered into your electronic medical record.  These signatures attest to the fact that that the information above on your After Visit Summary has been reviewed and is  understood.  Full responsibility of the confidentiality of this discharge information lies with you and/or your care-partner.

## 2021-05-14 NOTE — Op Note (Signed)
[facility] Patient Name: Vanessa [name] Procedure Date: 05/14/2021 9:26 AM MRN: [id number] Endoscopist: Jackquline Denmark , MD Age: 71 Referring MD:  Date of Birth: 08/08/1950 Gender: Female Account #: [id number] Procedure:                Upper GI endoscopy Indications:              Abnormal CT of the GI tract showing gastric wall                            thickening Medicines:                Monitored Anesthesia Care Procedure:                Pre-Anesthesia Assessment:                           - Prior to the procedure, a History and Physical                            was performed, and patient medications and                            allergies were reviewed. The patient's tolerance of                            previous anesthesia was also reviewed. The risks                            and benefits of the procedure and the sedation                            options and risks were discussed with the patient.                            All questions were answered, and informed consent                            was obtained. Prior Anticoagulants: The patient has                            taken Eliquis (apixaban), last dose was 1 day prior                            to procedure. ASA Grade Assessment: III - A patient                            with severe systemic disease. After reviewing the                            risks and benefits, the patient was deemed in                            satisfactory condition to undergo the procedure.  After obtaining informed consent, the endoscope was                            passed under direct vision. Throughout the                            procedure, the patient's blood pressure, pulse, and                            oxygen saturations were monitored continuously. The                            Endoscope was introduced through the mouth, and                            advanced to the second part of  duodenum. The upper                            GI endoscopy was accomplished without difficulty.                            The patient tolerated the procedure well. Scope In: Scope Out: Findings:                 The examined esophagus was normal with well-defined                            Z-line at 36 cm.                           A 2 cm hiatal hernia was present.                           Localized mild inflammation characterized by                            erythema was found in the gastric antrum. Biopsies                            were taken with a cold forceps for histology                            Jodi Mourning protocol).                           A 8 mm non-bleeding diverticulum was found in the                            second portion of the duodenum. Otherwise normal. Complications:            No immediate complications. Estimated Blood Loss:     Estimated blood loss: none. Impression:               - Small sliding hiatal hernia.                           -  Gastritis. Biopsied.                           - Non-bleeding duodenal diverticulum. Recommendation:           - Patient has a contact number available for                            emergencies. The signs and symptoms of potential                            delayed complications were discussed with the                            patient. Return to normal activities tomorrow.                            Written discharge instructions were provided to the                            patient.                           - Resume previous diet.                           - Continue present medications.                           - Await pathology results.                           - Resume Eliquis (apixaban) at prior dose tomorrow.                           - The findings and recommendations were discussed                            with the patient's family. Jackquline Denmark, MD 05/14/2021 9:51:41 AM This report has been signed  electronically.

## 2021-05-16 ENCOUNTER — Telehealth: Payer: Self-pay

## 2021-05-16 NOTE — Telephone Encounter (Signed)
°  Follow up Call-  Call back number 05/14/2021  Post procedure Call Back phone  # [phone]  Permission to leave phone message Yes  Some recent data might be hidden     Patient questions:  Do you have a fever, pain , or abdominal swelling? No. Pain Score  0 *  Have you tolerated food without any problems? Yes.    Have you been able to return to your normal activities? Yes.    Do you have any questions about your discharge instructions: Diet   No. Medications  No. Follow up visit  No.  Do you have questions or concerns about your Care? No.  Actions: * If pain score is 4 or above: No action needed, pain <4.  Have you developed a fever since your procedure? no  2.   Have you had an respiratory symptoms (SOB or cough) since your procedure? no  3.   Have you tested positive for COVID 19 since your procedure no  4.   Have you had any family members/close contacts diagnosed with the COVID 19 since your procedure?  no   If yes to any of these questions please route to Joylene John, RN and Joella Prince, RN

## 2021-05-20 ENCOUNTER — Encounter: Payer: Self-pay | Admitting: Gastroenterology

## 2021-05-24 ENCOUNTER — Encounter: Payer: Self-pay | Admitting: Gastroenterology

## 2021-06-13 ENCOUNTER — Other Ambulatory Visit: Payer: Self-pay | Admitting: *Deleted

## 2021-06-13 DIAGNOSIS — I480 Paroxysmal atrial fibrillation: Secondary | ICD-10-CM

## 2021-06-13 MED ORDER — APIXABAN 5 MG PO TABS: 5.0000 mg | ORAL_TABLET | Freq: Two times a day (BID) | ORAL | 5 refills | Status: DC

## 2021-06-13 NOTE — Telephone Encounter (Signed)
Eliquis 5mg  paper refill request received. Patient is 71 years old, weight-87.5kg, Crea-0.96 on 07/15/2020, Diagnosis-Afib, and last seen by Dr. Curt Bears on 11/04/2020. Dose is appropriate based on dosing criteria. Will send in refill to requested pharmacy.   ?

## 2021-07-15 ENCOUNTER — Encounter: Payer: Self-pay | Admitting: Cardiovascular Disease

## 2021-07-15 ENCOUNTER — Ambulatory Visit: Payer: PPO | Admitting: Cardiovascular Disease

## 2021-07-15 VITALS — BP 120/60 | HR 70 | Ht 68.0 in | Wt 193.0 lb

## 2021-07-15 DIAGNOSIS — I2510 Atherosclerotic heart disease of native coronary artery without angina pectoris: Secondary | ICD-10-CM | POA: Diagnosis not present

## 2021-07-15 DIAGNOSIS — I480 Paroxysmal atrial fibrillation: Secondary | ICD-10-CM

## 2021-07-15 DIAGNOSIS — I10 Essential (primary) hypertension: Secondary | ICD-10-CM | POA: Diagnosis not present

## 2021-07-15 DIAGNOSIS — I6522 Occlusion and stenosis of left carotid artery: Secondary | ICD-10-CM

## 2021-07-15 DIAGNOSIS — E782 Mixed hyperlipidemia: Secondary | ICD-10-CM

## 2021-07-15 NOTE — Progress Notes (Signed)
?Cardiology Office Note:   ? ?Date:  07/15/2021  ? ?ID:  Vanessa [name], DOB [birth year], MRN [id number] ? ?PCP:  Vanessa Broker, MD ?  ?[facility] Providers ?Cardiologist:  Vanessa Mocha, MD ?Electrophysiologist:  Vanessa [name], MD    ? ?Referring MD: Vanessa Broker, MD  ? ?Chief Complaint  ?Patient presents with  ? Coronary Artery Disease  ? ? ?History of Present Illness:   ? ?Vanessa [name] is a 71 y.o. female with a hx of paroxysmal atrial fibrillation, coronary artery disease, hypertension, and mixed hyperlipidemia.  In March 2022 she had undergone a coronary CTA demonstrating extensive CAD.  She ultimately underwent cardiac catheterization confirming severe multivessel coronary artery disease and she ultimately was treated with multivessel CABG and left atrial appendage clip placement July 10, 2020.  The patient's postoperative course was uncomplicated.  She presents today for follow-up evaluation now 1 year out from CABG. ? ?The patient is here alone today. She's doing well from a cardiac perspective, but has not been going to the gym regularly over the past 6 months. Her husband's health has been poor and this has had a big impact on her ability to maintain a regular program.  She has a localized area in the left lower ribcage area that bothers her at times.  She complains of dizziness ever since she had heart surgery.  This is not really changed much over time.  She has not had presyncope or frank syncope.  She denies edema, orthopnea, or PND.  She has intermittent palpitations.  No dyspnea with exertion. ? ?Past Medical History:  ?Diagnosis Date  ? Anemia   ? Anxiety   ? AR (allergic rhinitis)   ? Arthritis   ? knees  ? Atrial fibrillation ([place])   ? Blood transfusion without reported diagnosis   ? Cataract   ? bilateral /removed  ? Coronary artery disease   ? Depressed   ? Dysrhythmia   ? atrial fibrillation off and on  ? GERD (gastroesophageal reflux disease)   ? Glaucoma   ? HTN (hypertension)    ? Hyperlipidemia   ? Overweight(278.02)   ? ? ?Past Surgical History:  ?Procedure Laterality Date  ? ABDOMINAL HYSTERECTOMY    ? CARDIAC CATHETERIZATION    ? CATARACT EXTRACTION    ? catherization  02/1999  ? normal  ? CLIPPING OF ATRIAL APPENDAGE N/A 07/10/2020  ? Procedure: CLIPPING OF ATRIAL APPENDAGE;  Surgeon: Melrose Nakayama, MD;  Location: [place];  Service: Open Heart Surgery;  Laterality: N/A;  ? COLONOSCOPY    ? CORONARY ARTERY BYPASS GRAFT N/A 07/10/2020  ? Procedure: CORONARY ARTERY BYPASS GRAFTING (CABG)x5. LEFT INTERNAL MAMMARY ARTERY. RIGHT SAPHENOUS VEIN HARVESTING. LEFT ATRIAL APPENDAGE CLIPPING.;  Surgeon: Melrose Nakayama, MD;  Location: [place];  Service: Open Heart Surgery;  Laterality: N/A;  ? cyrotherapy    ? EP IMPLANTABLE DEVICE N/A 03/02/2016  ? Procedure: Loop Recorder Insertion;  Surgeon: Vanessa [name], MD;  Location: [facility];  Service: Cardiovascular;  Laterality: N/A;  ? EYE SURGERY    ? x2  ? GLAUCOMA SURGERY    ? bilateral  ? KNEE ARTHROSCOPY    ? left knee  ? LEFT HEART CATH AND CORONARY ANGIOGRAPHY N/A 07/01/2020  ? Procedure: LEFT HEART CATH AND CORONARY ANGIOGRAPHY;  Surgeon: Nelva Bush, MD;  Location: [place] [facility];  Service: Cardiovascular;  Laterality: N/A;  ? ROTATOR CUFF REPAIR    ? right shoulder  ? TEE  WITHOUT CARDIOVERSION N/A 07/10/2020  ? Procedure: TRANSESOPHAGEAL ECHOCARDIOGRAM (TEE);  Surgeon: Melrose Nakayama, MD;  Location: [place];  Service: Open Heart Surgery;  Laterality: N/A;  ? TONSILLECTOMY    ? removed as a tennager  ? UPPER GASTROINTESTINAL ENDOSCOPY    ? WISDOM TOOTH EXTRACTION    ? ? ?Current Medications: ?Current Meds  ?Medication Sig  ? apixaban (ELIQUIS) 5 MG TABS tablet Take 1 tablet (5 mg total) by mouth 2 (two) times daily.  ? Ascorbic Acid (VITAMIN C) 1000 MG tablet Take 1,000 mg by mouth daily.  ? Calcium Carbonate-Vitamin D (CALTRATE 600+D PO) Take 600 mg by mouth daily.  ? Cholecalciferol (VITAMIN D3) 50  MCG (2000 UT) TABS Take 2,000 Units by mouth daily.  ? clotrimazole-betamethasone (LOTRISONE) cream Apply 1 application topically 2 (two) times daily as needed.  ? diltiazem (CARDIZEM CD) 180 MG 24 hr capsule Take 1 capsule (180 mg total) by mouth daily.  ? esomeprazole (NEXIUM) 40 MG capsule Take 1 capsule (40 mg total) by mouth daily.  ? Multiple Vitamins-Minerals (ALIVE ONCE DAILY WOMENS 50+) TABS Take 1 tablet by mouth daily.  ? Polyethylene Glycol 400 (BLINK TEARS) 0.25 % SOLN Place 1 drop into both eyes daily as needed (Dry eyes).  ? rosuvastatin (CRESTOR) 10 MG tablet Take 1 tablet (10 mg total) by mouth daily.  ? valsartan-hydrochlorothiazide (DIOVAN HCT) 160-12.5 MG tablet Take 1 tablet by mouth daily.  ? Zinc 50 MG CAPS Take 50 mg by mouth daily.   ? [DISCONTINUED] aspirin EC 81 MG tablet Take 1 tablet (81 mg total) by mouth daily. Swallow whole.  ?  ? ?Allergies:   Codeine  ? ?Social History  ? ?Socioeconomic History  ? Marital status: Married  ?  Spouse name: Not on file  ? Number of children: Not on file  ? Years of education: Not on file  ? Highest education level: Not on file  ?Occupational History  ? 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 Smoking status: Never  ? Smokeless tobacco: Never  ?Vaping Use  ? Vaping Use: Never used  ?Substance and Sexual Activity  ? Alcohol use: No  ?  Comment: very rarely  ? Drug use: No  ? Sexual activity: Not on file  ?Other Topics Concern  ? Not on file  ?Social History Narrative  ? Lives in [place], [place]. Helps take care of her mother.   ? 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  ? ?Social Determinants of Health  ? ?Financial Resource Strain: Not on file  ?Food Insecurity: Not on file  ?Transportation Needs: Not on file  ?Physical Activity: Not on file  ?Stress: Not on file  ?Social Connections: Not on file  ?  ? ?Family History: ?The patient's family history includes Heart attack in her father; Stroke in her mother. There is  no history of Colon cancer, Colon polyps, Esophageal cancer, Rectal cancer, or Stomach cancer. ? ?ROS:   ?Please see the history of present illness.    ?All other systems reviewed and are negative. ? ?EKGs/Labs/Other Studies Reviewed:   ? ?The following studies were reviewed today: ?Event monitor 09/2020: ?Max 214 bpm 10:41am, 05/28 ?Min 48 bpm 06:17am, 05/24 ?Avg 68 bpm ?Less than 1% supraventricular ectopy ?7.1% ventricular ectopy ?Predominant underlying rhythm was sinus rhythm ?40 runs of SVT, longest 11.7 seconds ?1 run of ventricular tachycardia, 9 beats ?Triggered events and symptoms associated with sinus rhythm ? ?EKG:  EKG  is not ordered today.   ? ?Recent Labs: ?No results found for requested labs within last 8760 hours.  ?Recent Lipid Panel ?   ?Component Value Date/Time  ? CHOL 129 05/20/2020 1216  ? TRIG 145 05/20/2020 1216  ? HDL 46 05/20/2020 1216  ? CHOLHDL 2.8 05/20/2020 1216  ? [place] 58 05/20/2020 1216  ? ? ? ?Risk Assessment/Calculations:   ? ?CHA2DS2-VASc Score = 4  ? This indicates a 4.8% annual risk of stroke. ?The patient's score is based upon: ?CHF History: 0 ?HTN History: 1 ?Diabetes History: 0 ?Stroke History: 0 ?Vascular Disease History: 1 ?Age Score: 1 ?Gender Score: 1 ?  ? ? ?    ? ?Physical Exam:   ? ?VS:  BP 120/60   Pulse 70   Ht '5\' 8"'$  (1.727 m)   Wt 193 lb (87.5 kg)   SpO2 98%   BMI 29.35 kg/m?    ? ?Wt Readings from Last 3 Encounters:  ?07/15/21 193 lb (87.5 kg)  ?05/14/21 193 lb (87.5 kg)  ?03/21/21 193 lb (87.5 kg)  ?  ? ?GEN:  Well nourished, well developed in no acute distress ?HEENT: Normal ?NECK: No JVD; Left carotid bruit ?LYMPHATICS: No lymphadenopathy ?CARDIAC: RRR, no murmurs, rubs, gallops ?RESPIRATORY:  Clear to auscultation without rales, wheezing or rhonchi  ?ABDOMEN: Soft, non-tender, non-distended ?MUSCULOSKELETAL:  No edema; No deformity  ?SKIN: Warm and dry ?NEUROLOGIC:  Alert and oriented x 3 ?PSYCHIATRIC:  Normal affect  ? ?ASSESSMENT:   ? ?1. Paroxysmal  atrial fibrillation (HCC)   ?2. Coronary artery disease involving native coronary artery of native heart without angina pectoris   ?3. Essential hypertension   ?4. Mixed hyperlipidemia   ?5. Left carotid arter

## 2021-07-15 NOTE — Patient Instructions (Signed)
Medication Instructions:  ?STOP Aspirin '81mg'$  daily ?*If you need a refill on your cardiac medications before your next appointment, please call your pharmacy* ? ? ?Lab Work: ?CBC, CMET, Lipids (in [place] office) ?If you have labs (blood work) drawn today and your tests are completely normal, you will receive your results only by: ?MyChart Message (if you have MyChart) OR ?A paper copy in the mail ?If you have any lab test that is abnormal or we need to change your treatment, we will call you to review the results. ? ? ?Testing/Procedures: ?Carotid Ultrasound ?Your physician has requested that you have a carotid duplex. This test is an ultrasound of the carotid arteries in your neck. It looks at blood flow through these arteries that supply the brain with blood. Allow one hour for this exam. There are no restrictions or special instructions. ? ?Follow-Up: ?At [facility], you and your health needs are our priority.  As part of our continuing mission to provide you with exceptional heart care, we have created designated Provider Care Teams.  These Care Teams include your primary Cardiologist (physician) and Advanced Practice Providers (APPs -  Physician Assistants and Nurse Practitioners) who all work together to provide you with the care you need, when you need it. ? ?Your next appointment:   ?1 year(s) ? ?The format for your next appointment:   ?In Person ? ?Provider:   ?Sherren Mocha, MD   ? ?  ?

## 2021-07-21 ENCOUNTER — Ambulatory Visit (INDEPENDENT_AMBULATORY_CARE_PROVIDER_SITE_OTHER): Payer: PPO

## 2021-07-21 DIAGNOSIS — I6522 Occlusion and stenosis of left carotid artery: Secondary | ICD-10-CM | POA: Diagnosis not present

## 2021-07-21 LAB — COMPREHENSIVE METABOLIC PANEL
ALT: 20 IU/L (ref 0–32)
AST: 20 IU/L (ref 0–40)
Albumin/Globulin Ratio: 1.8 (ref 1.2–2.2)
Albumin: 4.3 g/dL (ref 3.7–4.7)
Alkaline Phosphatase: 59 IU/L (ref 44–121)
BUN/Creatinine Ratio: 20 (ref 12–28)
BUN: 20 mg/dL (ref 8–27)
Bilirubin Total: 0.6 mg/dL (ref 0.0–1.2)
CO2: 28 mmol/L (ref 20–29)
Calcium: 10 mg/dL (ref 8.7–10.3)
Chloride: 102 mmol/L (ref 96–106)
Creatinine, Ser: 0.99 mg/dL (ref 0.57–1.00)
Globulin, Total: 2.4 g/dL (ref 1.5–4.5)
Glucose: 110 mg/dL — ABNORMAL HIGH (ref 70–99)
Potassium: 4.5 mmol/L (ref 3.5–5.2)
Sodium: 143 mmol/L (ref 134–144)
Total Protein: 6.7 g/dL (ref 6.0–8.5)
eGFR: 61 mL/min/{1.73_m2} (ref >59)

## 2021-07-21 LAB — CBC
Hematocrit: 38.2 % (ref 34.0–46.6)
Hemoglobin: 12.7 g/dL (ref 11.1–15.9)
MCH: 29.7 pg (ref 26.6–33.0)
MCHC: 33.2 g/dL (ref 31.5–35.7)
MCV: 90 fL (ref 79–97)
Platelets: 213 10*3/uL (ref 150–450)
RBC: 4.27 x10E6/uL (ref 3.77–5.28)
RDW: 13.8 % (ref 11.7–15.4)
WBC: 7.9 10*3/uL (ref 3.4–10.8)

## 2021-07-21 LAB — LIPID PANEL
Chol/HDL Ratio: 3.3 ratio (ref 0.0–4.4)
Cholesterol, Total: 140 mg/dL (ref 100–199)
HDL: 42 mg/dL (ref >39)
LDL Chol Calc (NIH): 69 mg/dL (ref 0–99)
Triglycerides: 171 mg/dL — ABNORMAL HIGH (ref 0–149)
VLDL Cholesterol Cal: 29 mg/dL (ref 5–40)

## 2021-07-22 ENCOUNTER — Telehealth: Payer: Self-pay

## 2021-07-22 DIAGNOSIS — I6522 Occlusion and stenosis of left carotid artery: Secondary | ICD-10-CM

## 2021-07-22 NOTE — Telephone Encounter (Signed)
-----   Message from Sherren Mocha, MD sent at 07/21/2021  9:56 PM EDT ----- ?Ordered carotid US because of a prominent left carotid bruit on exam. [place] confirms severe left carotid stenosis. Please make referral to vascular surgery for consideration of treatment options. thanks ?

## 2021-07-22 NOTE — Telephone Encounter (Signed)
Called and reviewed results with patient. Referral placed at this time to vascular surgery for further treatment. Pt aware someone will call her to schedule. ?

## 2021-07-31 ENCOUNTER — Ambulatory Visit: Payer: PPO | Admitting: Vascular Surgery

## 2021-07-31 ENCOUNTER — Encounter: Payer: Self-pay | Admitting: Vascular Surgery

## 2021-07-31 ENCOUNTER — Other Ambulatory Visit: Payer: Self-pay | Admitting: *Deleted

## 2021-07-31 ENCOUNTER — Ambulatory Visit (HOSPITAL_COMMUNITY)
Admission: RE | Admit: 2021-07-31 | Discharge: 2021-07-31 | Disposition: A | Discharge status: Home / Self Care | Payer: PPO | Source: Ambulatory Visit | Attending: Vascular Surgery | Admitting: Vascular Surgery

## 2021-07-31 VITALS — BP 136/61 | HR 63 | Temp 98.0°F | Resp 20 | Ht 68.0 in | Wt 189.0 lb

## 2021-07-31 DIAGNOSIS — I6523 Occlusion and stenosis of bilateral carotid arteries: Secondary | ICD-10-CM

## 2021-07-31 NOTE — H&P (View-Only) (Signed)
? ?ASSESSMENT & PLAN  ? ?GREATER THAN 80% LEFT CAROTID STENOSIS: This patient has a greater than 80% left carotid stenosis.  She is asymptomatic.  However given the severity of stenosis I have recommended left carotid endarterectomy in order to lower her risk of future stroke. I have reviewed the indications for carotid endarterectomy, that is to lower the risk of future stroke. I have also reviewed the potential complications of surgery, including but not limited to: bleeding, stroke (perioperative risk 1-2%), MI, nerve injury of other unpredictable medical problems. All of the patients questions were answered and they are agreeable to proceed with surgery.  Her surgery is scheduled for 08/19/2021.  We will hold her Eliquis 3 days prior to the procedure.  I have also asked her to begin taking aspirin 81 mg daily for 1 week prior to the procedure.  We will continue this for at least 30 days after surgery. ? ? ?REASON FOR CONSULT:   ? ?Severe left carotid stenosis.  The consult was requested by Dr. Sherren Mocha. ? ?HPI:  ? ?Vanessa [name] is a 71 y.o. female who underwent coronary revascularization in March 2022.  She had a known left carotid stenosis.  On a follow-up study in [place] this had progressed to greater than 80%.  She sent for vascular consultation. ? ?She is right-handed.  She denies any previous history of stroke, TIAs, expressive or receptive aphasia, or amaurosis fugax. ? ?She has a history of atrial fibrillation and is on Eliquis.  She is not on aspirin.  This was stopped recently.  She is on a statin. ? ?She denies any recent chest pain.  She denies significant dyspnea on exertion. ? ?Her risk factors for peripheral arterial disease include hypertension.  She denies any history of diabetes, hypercholesterolemia, family history of premature cardiovascular disease, or smoking history. ? ?Past Medical History:  ?Diagnosis Date  ? Anemia   ? Anxiety   ? AR (allergic rhinitis)   ? Arthritis   ? knees  ?  Atrial fibrillation ([place])   ? Blood transfusion without reported diagnosis   ? Cataract   ? bilateral /removed  ? Coronary artery disease   ? Depressed   ? Dysrhythmia   ? atrial fibrillation off and on  ? GERD (gastroesophageal reflux disease)   ? Glaucoma   ? HTN (hypertension)   ? Hyperlipidemia   ? Overweight(278.02)   ? ? ?Family History  ?Problem Relation Age of Onset  ? Stroke Mother   ? Heart attack Father   ? Colon cancer Neg Hx   ? Colon polyps Neg Hx   ? Esophageal cancer Neg Hx   ? Rectal cancer Neg Hx   ? Stomach cancer Neg Hx   ? ? ?SOCIAL HISTORY: ?Social History  ? ?Tobacco Use  ? Smoking status: Never  ? Smokeless tobacco: Never  ?Substance Use Topics  ? Alcohol use: No  ?  Comment: very rarely  ? ? ?Allergies  ?Allergen Reactions  ? Codeine Nausea Only  ?  nasuea  ? ? ?Current Outpatient Medications  ?Medication Sig Dispense Refill  ? apixaban (ELIQUIS) 5 MG TABS tablet Take 1 tablet (5 mg total) by mouth 2 (two) times daily. 60 tablet 5  ? Ascorbic Acid (VITAMIN C) 1000 MG tablet Take 1,000 mg by mouth daily.    ? Calcium Carbonate-Vitamin D (CALTRATE 600+D PO) Take 600 mg by mouth daily.    ? Cholecalciferol (VITAMIN D3) 50 MCG (2000 UT) TABS Take 2,000 Units  by mouth daily.    ? Cholecalciferol 125 MCG (5000 UT) capsule Take by mouth.    ? clotrimazole-betamethasone (LOTRISONE) cream Apply 1 application topically 2 (two) times daily as needed.    ? co-enzyme Q-10 30 MG capsule Take 30 mg by mouth 3 (three) times daily.    ? diltiazem (CARDIZEM CD) 180 MG 24 hr capsule Take 1 capsule (180 mg total) by mouth daily. 90 capsule 2  ? esomeprazole (NEXIUM) 40 MG capsule Take 1 capsule (40 mg total) by mouth daily. 30 capsule 6  ? meclizine (ANTIVERT) 25 MG tablet Take 1 tablet (25 mg total) by mouth 3 (three) times daily as needed for dizziness. 30 tablet 0  ? Multiple Vitamins-Minerals (ALIVE ONCE DAILY WOMENS 50+) TABS Take 1 tablet by mouth daily.    ? Polyethylene Glycol 400 (BLINK TEARS) 0.25 %  SOLN Place 1 drop into both eyes daily as needed (Dry eyes).    ? rosuvastatin (CRESTOR) 10 MG tablet Take 1 tablet (10 mg total) by mouth daily. 90 tablet 3  ? valsartan-hydrochlorothiazide (DIOVAN HCT) 160-12.5 MG tablet Take 1 tablet by mouth daily. 90 tablet 3  ? Zinc 50 MG CAPS Take 50 mg by mouth daily.     ? ?No current facility-administered medications for this visit.  ? ? ?REVIEW OF SYSTEMS:  ?'[X]'$  denotes positive finding, '[ ]'$  denotes negative finding ?Cardiac  Comments:  ?Chest pain or chest pressure:    ?Shortness of breath upon exertion:    ?Short of breath when lying flat:    ?Irregular heart rhythm: x   ?    ?Vascular    ?Pain in calf, thigh, or hip brought on by ambulation:    ?Pain in feet at night that wakes you up from your sleep:  x Cramps  ?Blood clot in your veins:    ?Leg swelling:     ?    ?Pulmonary    ?Oxygen at home:    ?Productive cough:     ?Wheezing:     ?    ?Neurologic    ?Sudden weakness in arms or legs:     ?Sudden numbness in arms or legs:     ?Sudden onset of difficulty speaking or slurred speech:    ?Temporary loss of vision in one eye:     ?Problems with dizziness:  x   ?    ?Gastrointestinal    ?Blood in stool:     ?Vomited blood:     ?    ?Genitourinary    ?Burning when urinating:     ?Blood in urine:    ?    ?Psychiatric    ?Major depression:     ?    ?Hematologic    ?Bleeding problems:    ?Problems with blood clotting too easily:    ?    ?Skin    ?Rashes or ulcers:    ?    ?Constitutional    ?Fever or chills:    ?- ? ?PHYSICAL EXAM:  ? ?Vitals:  ? 07/31/21 1312 07/31/21 1315  ?BP: 137/62 136/61  ?Pulse: 63   ?Resp: 20   ?Temp: 98 ?F (36.7 ?C)   ?SpO2: 99%   ?Weight: 189 lb (85.7 kg)   ?Height: '5\' 8"'$  (1.727 m)   ? ?Body mass index is 28.74 kg/m?. ?GENERAL: The patient is a well-nourished female, in no acute distress. The vital signs are documented above. ?CARDIAC: There is a regular rate and rhythm.  ?VASCULAR: She has a  left carotid bruit. ?I did look at her left carotid  myself with the [employer] and I did not think the bifurcation was excessively high. ?She has palpable femoral and pedal pulses. ?PULMONARY: There is good air exchange bilaterally without wheezing or rales. ?ABDOMEN: Soft and non-tender with normal pitched bowel sounds.  ?MUSCULOSKELETAL: There are no major deformities. ?NEUROLOGIC: No focal weakness or paresthesias are detected. ?SKIN: There are no ulcers or rashes noted. ?PSYCHIATRIC: The patient has a normal affect. ? ?DATA:   ? ?CAROTID DUPLEX: I reviewed the carotid duplex scan that was done in [place].  This was done on 07/21/2021. ? ?On the right side there was a 40 to 59% carotid stenosis.  The right vertebral artery was patent with antegrade flow. ? ?On the left side there was a greater than 80% stenosis.  The left vertebral artery was patent with antegrade flow.  There was no evidence of subclavian artery disease bilaterally.  There was some stenosis noted in the left vertebral artery. ? ?LIMITED LEFT CAROTID DUPLEX: We did a limited left carotid duplex scan in our office to assess the lesion for surgery.  This confirms a greater than 80% left ICA stenosis in the proximal ICA.  The stenosis is at the bifurcation and the proximal ICA.  It was noted that the bifurcation was slightly high. ? ?Deitra Mayo ?[facility] ?

## 2021-07-31 NOTE — Progress Notes (Signed)
? ?ASSESSMENT & PLAN  ? ?GREATER THAN 80% LEFT CAROTID STENOSIS: This patient has a greater than 80% left carotid stenosis.  She is asymptomatic.  However given the severity of stenosis I have recommended left carotid endarterectomy in order to lower her risk of future stroke. I have reviewed the indications for carotid endarterectomy, that is to lower the risk of future stroke. I have also reviewed the potential complications of surgery, including but not limited to: bleeding, stroke (perioperative risk 1-2%), MI, nerve injury of other unpredictable medical problems. All of the patients questions were answered and they are agreeable to proceed with surgery.  Her surgery is scheduled for 08/19/2021.  We will hold her Eliquis 3 days prior to the procedure.  I have also asked her to begin taking aspirin 81 mg daily for 1 week prior to the procedure.  We will continue this for at least 30 days after surgery. ? ? ?REASON FOR CONSULT:   ? ?Severe left carotid stenosis.  The consult was requested by Dr. Sherren Mocha. ? ?HPI:  ? ?Vanessa [name] is a 71 y.o. female who underwent coronary revascularization in March 2022.  She had a known left carotid stenosis.  On a follow-up study in [place] this had progressed to greater than 80%.  She sent for vascular consultation. ? ?She is right-handed.  She denies any previous history of stroke, TIAs, expressive or receptive aphasia, or amaurosis fugax. ? ?She has a history of atrial fibrillation and is on Eliquis.  She is not on aspirin.  This was stopped recently.  She is on a statin. ? ?She denies any recent chest pain.  She denies significant dyspnea on exertion. ? ?Her risk factors for peripheral arterial disease include hypertension.  She denies any history of diabetes, hypercholesterolemia, family history of premature cardiovascular disease, or smoking history. ? ?Past Medical History:  ?Diagnosis Date  ? Anemia   ? Anxiety   ? AR (allergic rhinitis)   ? Arthritis   ? knees  ?  Atrial fibrillation ([place])   ? Blood transfusion without reported diagnosis   ? Cataract   ? bilateral /removed  ? Coronary artery disease   ? Depressed   ? Dysrhythmia   ? atrial fibrillation off and on  ? GERD (gastroesophageal reflux disease)   ? Glaucoma   ? HTN (hypertension)   ? Hyperlipidemia   ? Overweight(278.02)   ? ? ?Family History  ?Problem Relation Age of Onset  ? Stroke Mother   ? Heart attack Father   ? Colon cancer Neg Hx   ? Colon polyps Neg Hx   ? Esophageal cancer Neg Hx   ? Rectal cancer Neg Hx   ? Stomach cancer Neg Hx   ? ? ?SOCIAL HISTORY: ?Social History  ? ?Tobacco Use  ? Smoking status: Never  ? Smokeless tobacco: Never  ?Substance Use Topics  ? Alcohol use: No  ?  Comment: very rarely  ? ? ?Allergies  ?Allergen Reactions  ? Codeine Nausea Only  ?  nasuea  ? ? ?Current Outpatient Medications  ?Medication Sig Dispense Refill  ? apixaban (ELIQUIS) 5 MG TABS tablet Take 1 tablet (5 mg total) by mouth 2 (two) times daily. 60 tablet 5  ? Ascorbic Acid (VITAMIN C) 1000 MG tablet Take 1,000 mg by mouth daily.    ? Calcium Carbonate-Vitamin D (CALTRATE 600+D PO) Take 600 mg by mouth daily.    ? Cholecalciferol (VITAMIN D3) 50 MCG (2000 UT) TABS Take 2,000 Units  by mouth daily.    ? Cholecalciferol 125 MCG (5000 UT) capsule Take by mouth.    ? clotrimazole-betamethasone (LOTRISONE) cream Apply 1 application topically 2 (two) times daily as needed.    ? co-enzyme Q-10 30 MG capsule Take 30 mg by mouth 3 (three) times daily.    ? diltiazem (CARDIZEM CD) 180 MG 24 hr capsule Take 1 capsule (180 mg total) by mouth daily. 90 capsule 2  ? esomeprazole (NEXIUM) 40 MG capsule Take 1 capsule (40 mg total) by mouth daily. 30 capsule 6  ? meclizine (ANTIVERT) 25 MG tablet Take 1 tablet (25 mg total) by mouth 3 (three) times daily as needed for dizziness. 30 tablet 0  ? Multiple Vitamins-Minerals (ALIVE ONCE DAILY WOMENS 50+) TABS Take 1 tablet by mouth daily.    ? Polyethylene Glycol 400 (BLINK TEARS) 0.25 %  SOLN Place 1 drop into both eyes daily as needed (Dry eyes).    ? rosuvastatin (CRESTOR) 10 MG tablet Take 1 tablet (10 mg total) by mouth daily. 90 tablet 3  ? valsartan-hydrochlorothiazide (DIOVAN HCT) 160-12.5 MG tablet Take 1 tablet by mouth daily. 90 tablet 3  ? Zinc 50 MG CAPS Take 50 mg by mouth daily.     ? ?No current facility-administered medications for this visit.  ? ? ?REVIEW OF SYSTEMS:  ?'[X]'$  denotes positive finding, '[ ]'$  denotes negative finding ?Cardiac  Comments:  ?Chest pain or chest pressure:    ?Shortness of breath upon exertion:    ?Short of breath when lying flat:    ?Irregular heart rhythm: x   ?    ?Vascular    ?Pain in calf, thigh, or hip brought on by ambulation:    ?Pain in feet at night that wakes you up from your sleep:  x Cramps  ?Blood clot in your veins:    ?Leg swelling:     ?    ?Pulmonary    ?Oxygen at home:    ?Productive cough:     ?Wheezing:     ?    ?Neurologic    ?Sudden weakness in arms or legs:     ?Sudden numbness in arms or legs:     ?Sudden onset of difficulty speaking or slurred speech:    ?Temporary loss of vision in one eye:     ?Problems with dizziness:  x   ?    ?Gastrointestinal    ?Blood in stool:     ?Vomited blood:     ?    ?Genitourinary    ?Burning when urinating:     ?Blood in urine:    ?    ?Psychiatric    ?Major depression:     ?    ?Hematologic    ?Bleeding problems:    ?Problems with blood clotting too easily:    ?    ?Skin    ?Rashes or ulcers:    ?    ?Constitutional    ?Fever or chills:    ?- ? ?PHYSICAL EXAM:  ? ?Vitals:  ? 07/31/21 1312 07/31/21 1315  ?BP: 137/62 136/61  ?Pulse: 63   ?Resp: 20   ?Temp: 98 ?F (36.7 ?C)   ?SpO2: 99%   ?Weight: 189 lb (85.7 kg)   ?Height: '5\' 8"'$  (1.727 m)   ? ?Body mass index is 28.74 kg/m?. ?GENERAL: The patient is a well-nourished female, in no acute distress. The vital signs are documented above. ?CARDIAC: There is a regular rate and rhythm.  ?VASCULAR: She has a  left carotid bruit. ?I did look at her left carotid  myself with the SonoSite and I did not think the bifurcation was excessively high. ?She has palpable femoral and pedal pulses. ?PULMONARY: There is good air exchange bilaterally without wheezing or rales. ?ABDOMEN: Soft and non-tender with normal pitched bowel sounds.  ?MUSCULOSKELETAL: There are no major deformities. ?NEUROLOGIC: No focal weakness or paresthesias are detected. ?SKIN: There are no ulcers or rashes noted. ?PSYCHIATRIC: The patient has a normal affect. ? ?DATA:   ? ?CAROTID DUPLEX: I reviewed the carotid duplex scan that was done in [place].  This was done on 07/21/2021. ? ?On the right side there was a 40 to 59% carotid stenosis.  The right vertebral artery was patent with antegrade flow. ? ?On the left side there was a greater than 80% stenosis.  The left vertebral artery was patent with antegrade flow.  There was no evidence of subclavian artery disease bilaterally.  There was some stenosis noted in the left vertebral artery. ? ?LIMITED LEFT CAROTID DUPLEX: We did a limited left carotid duplex scan in our office to assess the lesion for surgery.  This confirms a greater than 80% left ICA stenosis in the proximal ICA.  The stenosis is at the bifurcation and the proximal ICA.  It was noted that the bifurcation was slightly high. ? ?Deitra Mayo ?[facility] ?

## 2021-08-01 ENCOUNTER — Encounter: Payer: Self-pay | Admitting: Vascular Surgery

## 2021-08-01 ENCOUNTER — Encounter: Payer: Self-pay | Admitting: Cardiovascular Disease

## 2021-08-05 ENCOUNTER — Other Ambulatory Visit: Payer: Self-pay

## 2021-08-05 DIAGNOSIS — I6522 Occlusion and stenosis of left carotid artery: Secondary | ICD-10-CM

## 2021-08-13 ENCOUNTER — Encounter: Payer: Self-pay | Admitting: Cardiology

## 2021-08-13 NOTE — Progress Notes (Signed)
PERIOPERATIVE PRESCRIPTION FOR IMPLANTED CARDIAC DEVICE PROGRAMMING ? ?Patient Information: ?Name:  Vanessa [name]  ?DOB:  Oct 28, 1950  ?MRN:  [id number]  ?  ?Planned Procedure:  Left caroltid endarterectomy  ?Surgeon:  Dr. Deitra Mayo  ?Date of Procedure:  08/19/21  ?Cautery will be used.  ?Position during surgery:  supine  ? ?Please send documentation back to:  ?[facility] (Fax # [phone])  ? ?Gleason, Thornell Mule, RN  ?08/13/2021 4:25 PM  ?Device Information: ? ?Clinic EP Physician:  Allegra Lai, MD  ? ?Device Type:  Medtronic Loop Recorder~  Device has been inactive since 10/20/2019. No action needed for procedure.   ? ?Manufacturer and Phone #:  Medtronic: [phone] ? ? ?Per Device Clinic Standing Orders, ?Wanda Plump, RN  ?4:51 PM 08/13/2021  ?

## 2021-08-13 NOTE — Progress Notes (Signed)
Surgical Instructions ? ? ? Your procedure is scheduled on Tuesday May 9th. ? Report to [facility] Main Entrance "A" at 5:30 A.M., then check in with the Admitting office. ? Call this number if you have problems the morning of surgery: ? [phone] ? ? If you have any questions prior to your surgery date call [phone]: Open Monday-Friday 8am-4pm ? ? ? Remember: ? Do not eat or drink after midnight the night before your surgery ? ?  ? Take these medicines the morning of surgery with A SIP OF WATER: ?diltiazem (CARDIZEM CD) 180 MG 24 hr capsule ?esomeprazole (NEXIUM) 40 MG capsule ?rosuvastatin (CRESTOR) 10 MG tablet ? ? ?IF NEEDED  ?Polyethylene Glycol 400 (BLINK TEARS) 0.25 % SOLN ? ? ?Follow your surgeon's instructions on when to stop Eliquis.  If no instructions were given by your surgeon then you will need to call the office to get those instructions.   ? ? ?As of today, STOP taking any Aspirin (unless otherwise instructed by your surgeon) Aleve, Naproxen, Ibuprofen, Motrin, Advil, Goody's, BC's, all herbal medications, fish oil, and all vitamins. ? ?         ?Do not wear jewelry or makeup ?Do not wear lotions, powders, perfumes, or deodorant. ?Do not shave 48 hours prior to surgery.  ?Do not bring valuables to the hospital. ?Do not wear nail polish, gel polish, artificial nails, or any other type of covering on natural nails (fingers and toes) ?If you have artificial nails or gel coating that need to be removed by a nail salon, please have this removed prior to surgery. Artificial nails or gel coating may interfere with anesthesia's ability to adequately monitor your vital signs. ? ?[facility] is not responsible for any belongings or valuables. .  ? ?Do NOT Smoke (Tobacco/Vaping)  24 hours prior to your procedure ? ?If you use a CPAP at night, you may bring your mask for your overnight stay. ?  ?Contacts, glasses, hearing aids, dentures or partials may not be worn into surgery, please bring cases for  these belongings ?  ?For patients admitted to the hospital, discharge time will be determined by your treatment team. ?  ?Patients discharged the day of surgery will not be allowed to drive home, and someone needs to stay with them for 24 hours. ? ? ?SURGICAL WAITING ROOM VISITATION ?Patients having surgery or a procedure in a hospital may have two support people. ?Children under the age of 37 must have an adult with them who is not the patient. ?They may stay in the waiting area during the procedure and may switch out with other visitors. If the patient needs to stay at the hospital during part of their recovery, the visitor guidelines for inpatient rooms apply. ? ?Please refer to the [facility] website for the visitor guidelines for Inpatients (after your surgery is over and you are in a regular room).  ? ? ? ? ? ?Special instructions:   ? ?Oral Hygiene is also important to reduce your risk of infection.  Remember - BRUSH YOUR TEETH THE MORNING OF SURGERY WITH YOUR REGULAR TOOTHPASTE ? ? ?- Preparing For Surgery ? ?Before surgery, you can play an important role. Because skin is not sterile, your skin needs to be as free of germs as possible. You can reduce the number of germs on your skin by washing with CHG (chlorahexidine gluconate) Soap before surgery.  CHG is an antiseptic cleaner which kills germs and bonds with the skin to continue  killing germs even after washing.   ? ? ?Please do not use if you have an allergy to CHG or antibacterial soaps. If your skin becomes reddened/irritated stop using the CHG.  ?Do not shave (including legs and underarms) for at least 48 hours prior to first CHG shower. It is OK to shave your face. ? ?Please follow these instructions carefully. ?  ? ? Shower the NIGHT BEFORE SURGERY and the MORNING OF SURGERY with CHG Soap.  ? If you chose to wash your hair, wash your hair first as usual with your normal shampoo. After you shampoo, rinse your hair and body thoroughly to  remove the shampoo.  Then [employer] and genitals (private parts) with your normal soap and rinse thoroughly to remove soap. ? ?After that Use CHG Soap as you would any other liquid soap. You can apply CHG directly to the skin and wash gently with a scrungie or a clean washcloth.  ? ?Apply the CHG Soap to your body ONLY FROM THE NECK DOWN.  Do not use on open wounds or open sores. Avoid contact with your eyes, ears, mouth and genitals (private parts). Wash Face and genitals (private parts)  with your normal soap.  ? ?Wash thoroughly, paying special attention to the area where your surgery will be performed. ? ?Thoroughly rinse your body with warm water from the neck down. ? ?DO NOT shower/wash with your normal soap after using and rinsing off the CHG Soap. ? ?Pat yourself dry with a CLEAN TOWEL. ? ?Wear CLEAN PAJAMAS to bed the night before surgery ? ?Place CLEAN SHEETS on your bed the night before your surgery ? ?DO NOT SLEEP WITH PETS. ? ? ?Day of Surgery: ? ?Take a shower with CHG soap. ?Wear Clean/Comfortable clothing the morning of surgery ?Do not apply any deodorants/lotions.   ?Remember to brush your teeth WITH YOUR REGULAR TOOTHPASTE. ? ? ? ?If you received a COVID test during your pre-op visit, it is requested that you wear a mask when out in public, stay away from anyone that may not be feeling well, and notify your surgeon if you develop symptoms. If you have been in contact with anyone that has tested positive in the last 10 days, please notify your surgeon. ? ?  ?Please read over the following fact sheets that you were given.  ? ?

## 2021-08-14 ENCOUNTER — Other Ambulatory Visit: Payer: Self-pay

## 2021-08-14 ENCOUNTER — Encounter (HOSPITAL_COMMUNITY)
Admission: RE | Admit: 2021-08-14 | Discharge: 2021-08-14 | Disposition: A | Discharge status: Home / Self Care | Payer: PPO | Source: Ambulatory Visit | Attending: Vascular Surgery | Admitting: Vascular Surgery

## 2021-08-14 ENCOUNTER — Encounter (HOSPITAL_COMMUNITY): Payer: Self-pay

## 2021-08-14 VITALS — BP 139/52 | HR 65 | Temp 98.1°F | Resp 18 | Ht 68.0 in | Wt 189.1 lb

## 2021-08-14 DIAGNOSIS — E785 Hyperlipidemia, unspecified: Secondary | ICD-10-CM | POA: Insufficient documentation

## 2021-08-14 DIAGNOSIS — I480 Paroxysmal atrial fibrillation: Secondary | ICD-10-CM | POA: Diagnosis not present

## 2021-08-14 DIAGNOSIS — I2510 Atherosclerotic heart disease of native coronary artery without angina pectoris: Secondary | ICD-10-CM | POA: Insufficient documentation

## 2021-08-14 DIAGNOSIS — D649 Anemia, unspecified: Secondary | ICD-10-CM | POA: Insufficient documentation

## 2021-08-14 DIAGNOSIS — Z951 Presence of aortocoronary bypass graft: Secondary | ICD-10-CM | POA: Insufficient documentation

## 2021-08-14 DIAGNOSIS — Z01818 Encounter for other preprocedural examination: Secondary | ICD-10-CM

## 2021-08-14 DIAGNOSIS — I779 Disorder of arteries and arterioles, unspecified: Secondary | ICD-10-CM | POA: Insufficient documentation

## 2021-08-14 DIAGNOSIS — I10 Essential (primary) hypertension: Secondary | ICD-10-CM | POA: Insufficient documentation

## 2021-08-14 DIAGNOSIS — H409 Unspecified glaucoma: Secondary | ICD-10-CM | POA: Diagnosis not present

## 2021-08-14 DIAGNOSIS — Z7982 Long term (current) use of aspirin: Secondary | ICD-10-CM | POA: Diagnosis not present

## 2021-08-14 DIAGNOSIS — Z01812 Encounter for preprocedural laboratory examination: Secondary | ICD-10-CM | POA: Insufficient documentation

## 2021-08-14 DIAGNOSIS — Z7901 Long term (current) use of anticoagulants: Secondary | ICD-10-CM | POA: Diagnosis not present

## 2021-08-14 DIAGNOSIS — I6522 Occlusion and stenosis of left carotid artery: Secondary | ICD-10-CM | POA: Diagnosis not present

## 2021-08-14 DIAGNOSIS — K219 Gastro-esophageal reflux disease without esophagitis: Secondary | ICD-10-CM | POA: Diagnosis not present

## 2021-08-14 HISTORY — DX: Disorder of arteries and arterioles, unspecified: I77.9

## 2021-08-14 LAB — COMPREHENSIVE METABOLIC PANEL
ALT: 15 U/L (ref 0–44)
AST: 26 U/L (ref 15–41)
Albumin: 4 g/dL (ref 3.5–5.0)
Alkaline Phosphatase: 51 U/L (ref 38–126)
Anion gap: 10 (ref 5–15)
BUN: 20 mg/dL (ref 8–23)
CO2: 27 mmol/L (ref 22–32)
Calcium: 9.3 mg/dL (ref 8.9–10.3)
Chloride: 104 mmol/L (ref 98–111)
Creatinine, Ser: 1.24 mg/dL — ABNORMAL HIGH (ref 0.44–1.00)
GFR, Estimated: 46 mL/min — ABNORMAL LOW (ref >60)
Glucose, Bld: 103 mg/dL — ABNORMAL HIGH (ref 70–99)
Potassium: 4.3 mmol/L (ref 3.5–5.1)
Sodium: 141 mmol/L (ref 135–145)
Total Bilirubin: 0.9 mg/dL (ref 0.3–1.2)
Total Protein: 6.6 g/dL (ref 6.5–8.1)

## 2021-08-14 LAB — URINALYSIS, ROUTINE W REFLEX MICROSCOPIC
Bilirubin Urine: NEGATIVE
Glucose, UA: NEGATIVE mg/dL
Hgb urine dipstick: NEGATIVE
Ketones, ur: NEGATIVE mg/dL
Nitrite: NEGATIVE
Protein, ur: NEGATIVE mg/dL
Specific Gravity, Urine: 1.009 (ref 1.005–1.030)
pH: 6 (ref 5.0–8.0)

## 2021-08-14 LAB — CBC
HCT: 35.7 % — ABNORMAL LOW (ref 36.0–46.0)
Hemoglobin: 11.9 g/dL — ABNORMAL LOW (ref 12.0–15.0)
MCH: 30.9 pg (ref 26.0–34.0)
MCHC: 33.3 g/dL (ref 30.0–36.0)
MCV: 92.7 fL (ref 80.0–100.0)
Platelets: 182 10*3/uL (ref 150–400)
RBC: 3.85 MIL/uL — ABNORMAL LOW (ref 3.87–5.11)
RDW: 13.6 % (ref 11.5–15.5)
WBC: 10.1 10*3/uL (ref 4.0–10.5)
nRBC: 0 % (ref 0.0–0.2)

## 2021-08-14 LAB — TYPE AND SCREEN
ABO/RH(D): A POS
Antibody Screen: NEGATIVE

## 2021-08-14 LAB — SURGICAL PCR SCREEN
MRSA, PCR: NEGATIVE
Staphylococcus aureus: NEGATIVE

## 2021-08-14 LAB — PROTIME-INR
INR: 1.3 — ABNORMAL HIGH (ref 0.8–1.2)
Prothrombin Time: 16 seconds — ABNORMAL HIGH (ref 11.4–15.2)

## 2021-08-14 LAB — APTT: aPTT: 32 seconds (ref 24–36)

## 2021-08-14 NOTE — Progress Notes (Signed)
PCP - Dr. [name] ?Cardiologist - Dr. Sherren Mocha ? ?PPM/ICD - Denies ? ?Chest x-ray - N/A ?EKG - 11/04/20 ?Stress Test - Denies ?ECHO - 07/10/20 ?Cardiac Cath - 07/01/20 ? ?Sleep Study - Denies ? ?Not diabetic ? ?Blood Thinner Instructions: Stop Eliquis 3 days prior to surgery. LD should be on 08/15/21. ?Aspirin Instructions: Continue ASA per surgeon's note. ? ?ERAS Protcol - No ? ?COVID TEST- N/A ? ? ?Anesthesia review: Yes, cardiac hx ? ?Patient denies shortness of breath, fever, cough and chest pain at PAT appointment ? ? ?All instructions explained to the patient, with a verbal understanding of the material. Patient agrees to go over the instructions while at home for a better understanding. Patient also instructed to self quarantine after being tested for COVID-19. The opportunity to ask questions was provided. ? ? ?

## 2021-08-14 NOTE — Pre-Procedure Instructions (Signed)
Surgical Instructions ? ? ? Your procedure is scheduled on Tuesday May 9th, 2023 at 7:30 AM. ? Report to [facility] Main Entrance "A" at 5:30 A.M., then check in with the Admitting office. ? Call this number if you have problems the morning of surgery: ? [phone] ? ? If you have any questions prior to your surgery date call [phone]: Open Monday-Friday 8am-4pm ? ? ? Remember: ? Do not eat or drink after midnight the night before your surgery ? ?  ? Take these medicines the morning of surgery with A SIP OF WATER: ? ?diltiazem (CARDIZEM CD) 180 MG 24 hr capsule ?esomeprazole (NEXIUM) 40 MG capsule ?rosuvastatin (CRESTOR) 10 MG tablet ?Aspirin ? ?IF NEEDED  ?Polyethylene Glycol 400 (BLINK TEARS) 0.25 % SOLN ? ? ?Per surgeon's instructions, stop Eliquis 3 days prior to surgery. Your last dose will be Friday, 08/15/21. ? ? ?As of today, STOP taking any Aleve, Naproxen, Ibuprofen, Motrin, Advil, Goody's, BC's, all herbal medications, fish oil, and all vitamins. ? ?Do NOT Smoke (Tobacco/Vaping)  24 hours prior to your procedure ? ?If you use a CPAP at night, you may bring your mask for your overnight stay. ?  ?Contacts, glasses, hearing aids, dentures or partials may not be worn into surgery, please bring cases for these belongings ?  ?For patients admitted to the hospital, discharge time will be determined by your treatment team. ?  ?Patients discharged the day of surgery will not be allowed to drive home, and someone needs to stay with them for 24 hours. ? ? ?SURGICAL WAITING ROOM VISITATION ?Patients having surgery or a procedure in a hospital may have two support people. ?Children under the age of 76 must have an adult with them who is not the patient. ?They may stay in the waiting area during the procedure and may switch out with other visitors. If the patient needs to stay at the hospital during part of their recovery, the visitor guidelines for inpatient rooms apply. ? ?Please refer to the [facility] website  for the visitor guidelines for Inpatients (after your surgery is over and you are in a regular room).  ? ? ? ? ? ?Special instructions:   ? ?Oral Hygiene is also important to reduce your risk of infection.  Remember - BRUSH YOUR TEETH THE MORNING OF SURGERY WITH YOUR REGULAR TOOTHPASTE ? ? ?Vanessa [name]- Preparing For Surgery ? ?Before surgery, you can play an important role. Because skin is not sterile, your skin needs to be as free of germs as possible. You can reduce the number of germs on your skin by washing with CHG (chlorahexidine gluconate) Soap before surgery.  CHG is an antiseptic cleaner which kills germs and bonds with the skin to continue killing germs even after washing.   ? ? ?Please do not use if you have an allergy to CHG or antibacterial soaps. If your skin becomes reddened/irritated stop using the CHG.  ?Do not shave (including legs and underarms) for at least 48 hours prior to first CHG shower. It is OK to shave your face. ? ?Please follow these instructions carefully. ?  ? ? Shower the NIGHT BEFORE SURGERY and the MORNING OF SURGERY with CHG Soap.  ? If you chose to wash your hair, wash your hair first as usual with your normal shampoo. After you shampoo, rinse your hair and body thoroughly to remove the shampoo.  Then [employer] and genitals (private parts) with your normal soap and rinse thoroughly to remove soap. ? ?  After that Use CHG Soap as you would any other liquid soap. You can apply CHG directly to the skin and wash gently with a scrungie or a clean washcloth.  ? ?Apply the CHG Soap to your body ONLY FROM THE NECK DOWN.  Do not use on open wounds or open sores. Avoid contact with your eyes, ears, mouth and genitals (private parts). Wash Face and genitals (private parts)  with your normal soap.  ? ?Wash thoroughly, paying special attention to the area where your surgery will be performed. ? ?Thoroughly rinse your body with warm water from the neck down. ? ?DO NOT shower/wash with your  normal soap after using and rinsing off the CHG Soap. ? ?Pat yourself dry with a CLEAN TOWEL. ? ?Wear CLEAN PAJAMAS to bed the night before surgery ? ?Place CLEAN SHEETS on your bed the night before your surgery ? ?DO NOT SLEEP WITH PETS. ? ? ?Day of Surgery: ? ? Take a shower with CHG soap. ?Wear Clean/Comfortable clothing the morning of surgery ?Do not wear jewelry or makeup ?Do not wear lotions, powders, perfumes, or deodorant. ?Do not shave 48 hours prior to surgery.  ?Do not bring valuables to the hospital. ?Do not wear nail polish, gel polish, artificial nails, or any other type of covering on natural nails (fingers and toes) ?If you have artificial nails or gel coating that need to be removed by a nail salon, please have this removed prior to surgery. Artificial nails or gel coating may interfere with anesthesia's ability to adequately monitor your vital signs. ?Remember to brush your teeth WITH YOUR REGULAR TOOTHPASTE. ? ?[facility] is not responsible for any belongings or valuables. ? ? ?If you received a COVID test during your pre-op visit, it is requested that you wear a mask when out in public, stay away from anyone that may not be feeling well, and notify your surgeon if you develop symptoms. If you have been in contact with anyone that has tested positive in the last 10 days, please notify your surgeon. ? ?  ?Please read over the following fact sheets that you were given.  ? ?

## 2021-08-15 ENCOUNTER — Telehealth: Payer: Self-pay

## 2021-08-15 ENCOUNTER — Encounter (HOSPITAL_COMMUNITY): Payer: Self-pay

## 2021-08-15 MED ORDER — SULFAMETHOXAZOLE-TRIMETHOPRIM 800-160 MG PO TABS: 1.0000 | ORAL_TABLET | Freq: Two times a day (BID) | ORAL | 0 refills | Status: AC

## 2021-08-15 NOTE — Anesthesia Preprocedure Evaluation (Addendum)
Anesthesia Evaluation  ?Patient identified by MRN, date of birth, ID band ?Patient awake ? ? ? ?Reviewed: ?Allergy & Precautions, NPO status , Patient's Chart, lab work & pertinent test results ? ?History of Anesthesia Complications ?Negative for: history of anesthetic complications ? ?Airway ?Mallampati: II ? ?TM Distance: <3 FB ?Neck ROM: Full ? ? ? Dental ? ?(+) Dental Advisory Given, Teeth Intact ?  ?Pulmonary ?neg pulmonary ROS,  ?  ?Pulmonary exam normal ? ? ? ? ? ? ? Cardiovascular ?hypertension, Pt. on medications ?+ CAD and + CABG  ?+ dysrhythmias Atrial Fibrillation and Supra Ventricular Tachycardia  ?Rhythm:Regular Rate:Bradycardia ? ? ?'23 Carotid US - 80-99% left ICAS, 40-59% right ICAS ? ?'22 TEE - EF 55-60%, mild LVH, mild MR, trivial PR ? ?  ?Neuro/Psych ?PSYCHIATRIC DISORDERS Anxiety Depression negative neurological ROS ?   ? GI/Hepatic ?Neg liver ROS, GERD  Medicated and Controlled,  ?Endo/Other  ?negative endocrine ROS ? Renal/GU ?negative Renal ROS  ? ?  ?Musculoskeletal ? ?(+) Arthritis ,  ? Abdominal ?  ?Peds ? Hematology ? ?(+) Blood dyscrasia, anemia ,  ?INR 1.3 on 5/4 ?On eliquis, last dose 5/5 ?   ?Anesthesia Other Findings ? ? Reproductive/Obstetrics ? ?  ? ? ? ? ? ? ? ? ? ? ? ? ? ?  ?  ? ? ? ? ? ? ?Anesthesia Physical ?Anesthesia Plan ? ?ASA: 3 ? ?Anesthesia Plan: General  ? ?Post-op Pain Management: Tylenol PO (pre-op)*  ? ?Induction: Intravenous ? ?PONV Risk Score and Plan: 3 and Treatment may vary due to age or medical condition, Ondansetron and Dexamethasone ? ?Airway Management Planned: Oral ETT ? ?Additional Equipment: Arterial line ? ?Intra-op Plan:  ? ?Post-operative Plan: Extubation in OR ? ?Informed Consent: I have reviewed the patients History and Physical, chart, labs and discussed the procedure including the risks, benefits and alternatives for the proposed anesthesia with the patient or authorized representative who has indicated his/her  understanding and acceptance.  ? ? ? ?Dental advisory given ? ?Plan Discussed with: CRNA and Anesthesiologist ? ?Anesthesia Plan Comments:   ? ? ? ? ?Anesthesia Quick Evaluation ? ?

## 2021-08-15 NOTE — Progress Notes (Signed)
Anesthesia Chart Review: ? Case: [id number] Date/Time: 08/19/21 0715  ? Procedure: LEFT CAROTID ENDARTERECTOMY (Left)  ? Anesthesia type: General  ? Pre-op diagnosis: Left carotid artery stenosis  ? Location: [facility] ROOM 11 / [facility]  ? Surgeons: Angelia Mould, MD  ? ?  ? ? ?DISCUSSION: Patient is a 71 year old female scheduled for the above procedure.  She has asymptomatic greater than 80% left carotid stenosis.  She was referred for consideration of left carotid intervention by her cardiologist Dr. Burt Knack. ? ?History includes never smoker, HTN, HLD, CAD (CABG x5 07/10/20: LIMA-LAD, SVG-D1, SVG-Ramus INT-OM1, SVG-dRCA & left atrial appendage clip 35 mm Atricureflex-V), atrial fibrillation/PAF (loop recorder 03/02/16--device inactive since 10/20/19; LAA clip 07/10/20), GERD, anemia, glaucoma (s/p surgery), carotid artery disease. ? ?Per notes by Dr. Scot Dock, he has asked her to hold Eliquis 3 days prior to surgery and start ASA 1 week prior to surgery which he will continue for at least 30 days post-operatively.  ? ?Anesthesia team to evaluate on the day of surgery. ? ? ?VS: BP (!) 139/52   Pulse 65   Temp 36.7 ?C (Oral)   Resp 18   Ht '5\' 8"'$  (1.727 m)   Wt 85.8 kg   SpO2 99%   BMI 28.75 kg/m?  ? ? ?PROVIDERS: ?Myrlene Broker, MD is PCP ?Sherren Mocha, MD is cardiologist. Last visit 07/15/21.  ?Camnitz, Will, MD is EP. Last visit 11/04/20.  ?Modesto Charon, MD is CT surgeon ?Jackquline Denmark, MD is GI ? ? ?LABS: Labs reviewed: Acceptable for surgery. UA showed moderate leukocytes, negative nitrites. Dr. Scot Dock has marked labs as reviewed.  ?(all labs ordered are listed, but only abnormal results are displayed) ? ?Labs Reviewed  ?CBC - Abnormal; Notable for the following components:  ?    Result Value  ? RBC 3.85 (*)   ? Hemoglobin 11.9 (*)   ? HCT 35.7 (*)   ? All other components within normal limits  ?COMPREHENSIVE METABOLIC PANEL - Abnormal; Notable for the following components:  ? Glucose, Bld 103 (*)    ? Creatinine, Ser 1.24 (*)   ? GFR, Estimated 46 (*)   ? All other components within normal limits  ?PROTIME-INR - Abnormal; Notable for the following components:  ? Prothrombin Time 16.0 (*)   ? INR 1.3 (*)   ? All other components within normal limits  ?URINALYSIS, ROUTINE W REFLEX MICROSCOPIC - Abnormal; Notable for the following components:  ? APPearance HAZY (*)   ? Leukocytes,Ua MODERATE (*)   ? Bacteria, UA FEW (*)   ? Non Squamous Epithelial 0-5 (*)   ? All other components within normal limits  ?SURGICAL PCR SCREEN  ?APTT  ?TYPE AND SCREEN  ? ? ?EGD 05/14/21: ?IMPRESSION: ?- Small sliding hiatal hernia. ?- Gastritis. Biopsied. [Unremarkable antral and oxyntic mucosa.  No H. pylori identified] ?- Non-bleeding duodenal diverticulum. ? ? ?EKG: 11/04/2020: ?Normal sinus rhythm ?Cannot rule out anteroseptal infarct, age undetermined ? ? ?CV: ?US Carotid (limited) 07/31/21: ?Summary:  ?- Left Carotid: Velocities in the left ICA are consistent with a 80-99% stenosis.  ?- Vertebrals:  Left vertebral artery demonstrates antegrade flow.  ?- Subclavians: Normal flow hemodynamics were seen in the left subclavian  artery.  ?- Per 07/21/21 carotid US: Right Carotid: Velocities in the right ICA are consistent with a 40-59% stenosis.  ? ? ?Long term event monitor 08/27/20-09/10/20: ?Max 214 bpm 10:41am, 05/28 ?Min 48 bpm 06:17am, 05/24 ?Avg 68 bpm ?Less than 1% supraventricular ectopy ?7.1%  ventricular ectopy ?Predominant underlying rhythm was sinus rhythm ?40 runs of SVT, longest 11.7 seconds ?1 run of ventricular tachycardia, 9 beats ?Triggered events and symptoms associated with sinus rhythm ? ? ?TEE (intra-op CABG) 07/10/20: ?Left ventricle has normal systolic function with an ejection fraction of 55 to 60%.  LV cavity size was normal.  There was concentric mild LVH.  The right ventricle has normal systolic function.  The RV cavity was normal.  There was no increase in RV wall thickness.  Left atrial size was dilated.  No  left atrial/left atrial appendage thrombus was detected.  Right atrial size was normal.  No atrial level shunt detected by color-flow Doppler.  Trivial pericardial effusion.  Mild mitral regurgitation.  Mild tricuspid regurgitation.  There was evidence of aortic plaque in the descending aorta, grade 3, measuring 3 to 5 mm in size. ?- Post-operative IMPRESSIONS: Unchanged findings except for left atrial appendage has been surgically closed. ? ? ?Last cath 07/01/20 was prior to CABG and showed severe 3V CAD, normal LV contraction and filling pressure.  ? ? ?Echo 07/07/19: ?IMPRESSIONS  ? 1. Left ventricular ejection fraction, by estimation, is 60 to 65%. The  ?left ventricle has normal function. The left ventricle has no regional  ?wall motion abnormalities. Left ventricular diastolic parameters are  ?indeterminate. Elevated left ventricular  ?end-diastolic pressure. The average left ventricular global longitudinal  ?strain is -22.3 %.  ? 2. Right ventricular systolic function is normal. The right ventricular  ?size is normal.  ? 3. The mitral valve is normal in structure. Trivial mitral valve  ?regurgitation. No evidence of mitral stenosis.  ? 4. The aortic valve is tricuspid. Aortic valve regurgitation is not  ?visualized. Moderate aortic valve sclerosis/calcification is present,  ?without any evidence of aortic stenosis.  ? 5. The inferior vena cava is normal in size with greater than 50%  ?respiratory variability, suggesting right atrial pressure of 3 mmHg.  ?- Comparison(s): 02/19/16 EF 55-60%.  ? ? ?Past Medical History:  ?Diagnosis Date  ? Anemia   ? Anxiety   ? AR (allergic rhinitis)   ? Arthritis   ? knees  ? Atrial fibrillation ([place])   ? Blood transfusion without reported diagnosis   ? Cataract   ? bilateral /removed  ? Coronary artery disease   ? Depressed   ? Dysrhythmia   ? atrial fibrillation off and on  ? GERD (gastroesophageal reflux disease)   ? Glaucoma   ? HTN (hypertension)   ? Hyperlipidemia   ?  Overweight(278.02)   ? ? ?Past Surgical History:  ?Procedure Laterality Date  ? ABDOMINAL HYSTERECTOMY    ? CARDIAC CATHETERIZATION    ? CATARACT EXTRACTION    ? catherization  02/1999  ? normal  ? CLIPPING OF ATRIAL APPENDAGE N/A 07/10/2020  ? Procedure: CLIPPING OF ATRIAL APPENDAGE;  Surgeon: Melrose Nakayama, MD;  Location: [facility];  Service: Open Heart Surgery;  Laterality: N/A;  ? COLONOSCOPY    ? CORONARY ARTERY BYPASS GRAFT N/A 07/10/2020  ? Procedure: CORONARY ARTERY BYPASS GRAFTING (CABG)x5. LEFT INTERNAL MAMMARY ARTERY. RIGHT SAPHENOUS VEIN HARVESTING. LEFT ATRIAL APPENDAGE CLIPPING.;  Surgeon: Melrose Nakayama, MD;  Location: [facility];  Service: Open Heart Surgery;  Laterality: N/A;  ? cyrotherapy    ? EP IMPLANTABLE DEVICE N/A 03/02/2016  ? Procedure: Loop Recorder Insertion;  Surgeon: Will [name], MD;  Location: [facility];  Service: Cardiovascular;  Laterality: N/A;  ? EYE SURGERY    ? x2  ?  GLAUCOMA SURGERY    ? bilateral  ? KNEE ARTHROSCOPY    ? left knee  ? LEFT HEART CATH AND CORONARY ANGIOGRAPHY N/A 07/01/2020  ? Procedure: LEFT HEART CATH AND CORONARY ANGIOGRAPHY;  Surgeon: Nelva Bush, MD;  Location: [facility];  Service: Cardiovascular;  Laterality: N/A;  ? ROTATOR CUFF REPAIR    ? right shoulder  ? TEE WITHOUT CARDIOVERSION N/A 07/10/2020  ? Procedure: TRANSESOPHAGEAL ECHOCARDIOGRAM (TEE);  Surgeon: Melrose Nakayama, MD;  Location: [facility];  Service: Open Heart Surgery;  Laterality: N/A;  ? TONSILLECTOMY    ? removed as a tennager  ? UPPER GASTROINTESTINAL ENDOSCOPY    ? WISDOM TOOTH EXTRACTION    ? ? ?MEDICATIONS: ? apixaban (ELIQUIS) 5 MG TABS tablet  ? Ascorbic Acid (VITAMIN C) 1000 MG tablet  ? Calcium Carbonate-Vitamin D (CALTRATE 600+D PO)  ? Cholecalciferol (VITAMIN D3) 50 MCG (2000 UT) TABS  ? clotrimazole-betamethasone (LOTRISONE) cream  ? Coenzyme Q10 100 MG capsule  ? diltiazem (CARDIZEM CD) 180 MG 24 hr capsule  ? esomeprazole (NEXIUM) 40 MG  capsule  ? Multiple Vitamins-Minerals (ALIVE ONCE DAILY WOMENS 50+) TABS  ? Polyethylene Glycol 400 (BLINK TEARS) 0.25 % SOLN  ? rosuvastatin (CRESTOR) 10 MG tablet  ? valsartan-hydrochlorothiazide (DIOVAN HCT) 160-12

## 2021-08-15 NOTE — Telephone Encounter (Signed)
Spoke with patient regarding abnormal pre-op U/A results indicating a UTI and need to send Rx for Bactrim DS take 1 tablet by mouth twice daily x 5 days to local pharmacy. Patient verbalized understanding.  ?

## 2021-08-19 ENCOUNTER — Encounter (HOSPITAL_COMMUNITY): Payer: Self-pay | Admitting: Vascular Surgery

## 2021-08-19 ENCOUNTER — Inpatient Hospital Stay (HOSPITAL_COMMUNITY): Payer: PPO | Admitting: Vascular Surgery

## 2021-08-19 ENCOUNTER — Inpatient Hospital Stay (HOSPITAL_COMMUNITY)
Admission: RE | Admit: 2021-08-19 | Discharge: 2021-08-20 | DRG: 039 | Disposition: A | Discharge status: Home / Self Care | Payer: PPO | Attending: Vascular Surgery | Admitting: Vascular Surgery

## 2021-08-19 ENCOUNTER — Other Ambulatory Visit: Payer: Self-pay

## 2021-08-19 ENCOUNTER — Encounter (HOSPITAL_COMMUNITY)
Admission: RE | Disposition: A | Discharge status: Home / Self Care | Payer: Self-pay | Source: Home / Self Care | Attending: Vascular Surgery

## 2021-08-19 ENCOUNTER — Inpatient Hospital Stay (HOSPITAL_COMMUNITY): Payer: PPO | Admitting: Anesthesiology

## 2021-08-19 DIAGNOSIS — I6522 Occlusion and stenosis of left carotid artery: Secondary | ICD-10-CM

## 2021-08-19 DIAGNOSIS — Z8249 Family history of ischemic heart disease and other diseases of the circulatory system: Secondary | ICD-10-CM

## 2021-08-19 DIAGNOSIS — I4891 Unspecified atrial fibrillation: Secondary | ICD-10-CM | POA: Diagnosis present

## 2021-08-19 DIAGNOSIS — E785 Hyperlipidemia, unspecified: Secondary | ICD-10-CM | POA: Diagnosis present

## 2021-08-19 DIAGNOSIS — Z7901 Long term (current) use of anticoagulants: Secondary | ICD-10-CM | POA: Diagnosis not present

## 2021-08-19 DIAGNOSIS — K219 Gastro-esophageal reflux disease without esophagitis: Secondary | ICD-10-CM | POA: Diagnosis present

## 2021-08-19 DIAGNOSIS — F418 Other specified anxiety disorders: Secondary | ICD-10-CM

## 2021-08-19 DIAGNOSIS — J309 Allergic rhinitis, unspecified: Secondary | ICD-10-CM | POA: Diagnosis present

## 2021-08-19 DIAGNOSIS — I10 Essential (primary) hypertension: Secondary | ICD-10-CM

## 2021-08-19 DIAGNOSIS — Z885 Allergy status to narcotic agent status: Secondary | ICD-10-CM | POA: Diagnosis not present

## 2021-08-19 DIAGNOSIS — I6523 Occlusion and stenosis of bilateral carotid arteries: Principal | ICD-10-CM | POA: Diagnosis present

## 2021-08-19 DIAGNOSIS — Z79899 Other long term (current) drug therapy: Secondary | ICD-10-CM

## 2021-08-19 DIAGNOSIS — Z951 Presence of aortocoronary bypass graft: Secondary | ICD-10-CM | POA: Diagnosis not present

## 2021-08-19 DIAGNOSIS — I2510 Atherosclerotic heart disease of native coronary artery without angina pectoris: Secondary | ICD-10-CM | POA: Diagnosis present

## 2021-08-19 DIAGNOSIS — F419 Anxiety disorder, unspecified: Secondary | ICD-10-CM | POA: Diagnosis present

## 2021-08-19 DIAGNOSIS — H409 Unspecified glaucoma: Secondary | ICD-10-CM | POA: Diagnosis present

## 2021-08-19 DIAGNOSIS — E782 Mixed hyperlipidemia: Secondary | ICD-10-CM | POA: Diagnosis present

## 2021-08-19 DIAGNOSIS — I6529 Occlusion and stenosis of unspecified carotid artery: Principal | ICD-10-CM | POA: Diagnosis present

## 2021-08-19 HISTORY — PX: ENDARTERECTOMY: SHX5162

## 2021-08-19 SURGERY — ENDARTERECTOMY, CAROTID
Anesthesia: General | Site: Neck | Laterality: Left

## 2021-08-19 MED ORDER — STERILE WATER FOR IRRIGATION IR SOLN
Status: DC | PRN
  Administered 2021-08-19: 1000 mL

## 2021-08-19 MED ORDER — SODIUM CHLORIDE 0.9 % IV SOLN: INTRAVENOUS | Status: DC

## 2021-08-19 MED ORDER — POTASSIUM CHLORIDE CRYS ER 20 MEQ PO TBCR: 20.0000 meq | EXTENDED_RELEASE_TABLET | Freq: Every day | ORAL | Status: DC | PRN

## 2021-08-19 MED ORDER — PROPOFOL 10 MG/ML IV BOLUS
INTRAVENOUS | Status: AC
  Filled 2021-08-19: qty 20

## 2021-08-19 MED ORDER — CEFAZOLIN SODIUM-DEXTROSE 2-4 GM/100ML-% IV SOLN
2.0000 g | Freq: Three times a day (TID) | INTRAVENOUS | Status: AC
  Administered 2021-08-19 (×2): 2 g via INTRAVENOUS
  Filled 2021-08-19 (×2): qty 100

## 2021-08-19 MED ORDER — OXYCODONE-ACETAMINOPHEN 5-325 MG PO TABS: 1.0000 | ORAL_TABLET | ORAL | Status: DC | PRN

## 2021-08-19 MED ORDER — SODIUM CHLORIDE 0.9 % IV SOLN
0.1500 ug/kg/min | INTRAVENOUS | Status: AC
  Administered 2021-08-19: .15 ug/kg/min via INTRAVENOUS
  Filled 2021-08-19: qty 2000

## 2021-08-19 MED ORDER — CHLORHEXIDINE GLUCONATE 0.12 % MT SOLN
OROMUCOSAL | Status: AC
  Administered 2021-08-19: 15 mL via OROMUCOSAL
  Filled 2021-08-19: qty 15

## 2021-08-19 MED ORDER — MAGNESIUM SULFATE 2 GM/50ML IV SOLN: 2.0000 g | Freq: Every day | INTRAVENOUS | Status: DC | PRN

## 2021-08-19 MED ORDER — ONDANSETRON HCL 4 MG/2ML IJ SOLN
INTRAMUSCULAR | Status: AC
  Filled 2021-08-19: qty 2

## 2021-08-19 MED ORDER — ACETAMINOPHEN 650 MG RE SUPP: 325.0000 mg | RECTAL | Status: DC | PRN

## 2021-08-19 MED ORDER — LIDOCAINE 2% (20 MG/ML) 5 ML SYRINGE
INTRAMUSCULAR | Status: DC | PRN
  Administered 2021-08-19: 40 mg via INTRAVENOUS

## 2021-08-19 MED ORDER — AMISULPRIDE (ANTIEMETIC) 5 MG/2ML IV SOLN: 10.0000 mg | Freq: Once | INTRAVENOUS | Status: DC | PRN

## 2021-08-19 MED ORDER — CHLORHEXIDINE GLUCONATE CLOTH 2 % EX PADS: 6.0000 | MEDICATED_PAD | Freq: Once | CUTANEOUS | Status: DC

## 2021-08-19 MED ORDER — HEPARIN SODIUM (PORCINE) 1000 UNIT/ML IJ SOLN
INTRAMUSCULAR | Status: AC
  Filled 2021-08-19: qty 10

## 2021-08-19 MED ORDER — LABETALOL HCL 5 MG/ML IV SOLN: 10.0000 mg | INTRAVENOUS | Status: DC | PRN

## 2021-08-19 MED ORDER — PHENYLEPHRINE 80 MCG/ML (10ML) SYRINGE FOR IV PUSH (FOR BLOOD PRESSURE SUPPORT)
PREFILLED_SYRINGE | INTRAVENOUS | Status: DC | PRN
  Administered 2021-08-19 (×2): 80 ug via INTRAVENOUS
  Administered 2021-08-19: 40 ug via INTRAVENOUS

## 2021-08-19 MED ORDER — ACETAMINOPHEN 500 MG PO TABS
ORAL_TABLET | ORAL | Status: AC
  Administered 2021-08-19: 1000 mg via ORAL
  Filled 2021-08-19: qty 2

## 2021-08-19 MED ORDER — PROPOFOL 10 MG/ML IV BOLUS
INTRAVENOUS | Status: DC | PRN
  Administered 2021-08-19: 100 mg via INTRAVENOUS

## 2021-08-19 MED ORDER — HEPARIN SODIUM (PORCINE) 1000 UNIT/ML IJ SOLN
INTRAMUSCULAR | Status: DC | PRN
  Administered 2021-08-19: 2000 [IU] via INTRAVENOUS
  Administered 2021-08-19: 8500 [IU] via INTRAVENOUS

## 2021-08-19 MED ORDER — ROCURONIUM BROMIDE 10 MG/ML (PF) SYRINGE
PREFILLED_SYRINGE | INTRAVENOUS | Status: DC | PRN
  Administered 2021-08-19 (×2): 20 mg via INTRAVENOUS
  Administered 2021-08-19: 60 mg via INTRAVENOUS

## 2021-08-19 MED ORDER — LIDOCAINE 2% (20 MG/ML) 5 ML SYRINGE
INTRAMUSCULAR | Status: AC
  Filled 2021-08-19: qty 5

## 2021-08-19 MED ORDER — SODIUM CHLORIDE 0.9 % IV SOLN: 500.0000 mL | Freq: Once | INTRAVENOUS | Status: DC | PRN

## 2021-08-19 MED ORDER — FENTANYL CITRATE (PF) 100 MCG/2ML IJ SOLN: 25.0000 ug | INTRAMUSCULAR | Status: DC | PRN

## 2021-08-19 MED ORDER — HYDROCHLOROTHIAZIDE 12.5 MG PO TABS
12.5000 mg | ORAL_TABLET | Freq: Every day | ORAL | Status: DC
  Administered 2021-08-20: 12.5 mg via ORAL
  Filled 2021-08-19: qty 1

## 2021-08-19 MED ORDER — CEFAZOLIN SODIUM-DEXTROSE 2-4 GM/100ML-% IV SOLN
2.0000 g | INTRAVENOUS | Status: AC
  Administered 2021-08-19: 2 g via INTRAVENOUS

## 2021-08-19 MED ORDER — ASPIRIN EC 81 MG PO TBEC
81.0000 mg | DELAYED_RELEASE_TABLET | Freq: Every day | ORAL | Status: DC
  Administered 2021-08-20: 81 mg via ORAL
  Filled 2021-08-19 (×2): qty 1

## 2021-08-19 MED ORDER — EPHEDRINE 5 MG/ML INJ
INTRAVENOUS | Status: AC
  Filled 2021-08-19: qty 5

## 2021-08-19 MED ORDER — DEXAMETHASONE SODIUM PHOSPHATE 10 MG/ML IJ SOLN
INTRAMUSCULAR | Status: AC
  Filled 2021-08-19: qty 1

## 2021-08-19 MED ORDER — ONDANSETRON HCL 4 MG/2ML IJ SOLN: 4.0000 mg | Freq: Four times a day (QID) | INTRAMUSCULAR | Status: DC | PRN

## 2021-08-19 MED ORDER — VALSARTAN-HYDROCHLOROTHIAZIDE 160-12.5 MG PO TABS: 1.0000 | ORAL_TABLET | Freq: Every day | ORAL | Status: DC

## 2021-08-19 MED ORDER — FENTANYL CITRATE (PF) 250 MCG/5ML IJ SOLN
INTRAMUSCULAR | Status: AC
  Filled 2021-08-19: qty 5

## 2021-08-19 MED ORDER — ROSUVASTATIN CALCIUM 5 MG PO TABS
10.0000 mg | ORAL_TABLET | Freq: Every day | ORAL | Status: DC
  Administered 2021-08-20: 10 mg via ORAL
  Filled 2021-08-19 (×2): qty 2

## 2021-08-19 MED ORDER — IRBESARTAN 150 MG PO TABS
150.0000 mg | ORAL_TABLET | Freq: Every day | ORAL | Status: DC
Start: 2021-08-19 — End: 2021-08-20
  Administered 2021-08-20: 150 mg via ORAL
  Filled 2021-08-19: qty 1

## 2021-08-19 MED ORDER — PANTOPRAZOLE SODIUM 40 MG PO TBEC
40.0000 mg | DELAYED_RELEASE_TABLET | Freq: Every day | ORAL | Status: DC
Start: 2021-08-19 — End: 2021-08-20
  Administered 2021-08-20: 40 mg via ORAL
  Filled 2021-08-19 (×2): qty 1

## 2021-08-19 MED ORDER — HEPARIN 6000 UNIT IRRIGATION SOLUTION
Status: AC
  Filled 2021-08-19: qty 500

## 2021-08-19 MED ORDER — ACETAMINOPHEN 500 MG PO TABS: 1000.0000 mg | ORAL_TABLET | Freq: Once | ORAL | Status: AC

## 2021-08-19 MED ORDER — PHENOL 1.4 % MT LIQD: 1.0000 | OROMUCOSAL | Status: DC | PRN

## 2021-08-19 MED ORDER — PROTAMINE SULFATE 10 MG/ML IV SOLN
INTRAVENOUS | Status: AC
  Filled 2021-08-19: qty 5

## 2021-08-19 MED ORDER — PHENYLEPHRINE HCL-NACL 20-0.9 MG/250ML-% IV SOLN
INTRAVENOUS | Status: DC | PRN
  Administered 2021-08-19: 50 ug/min via INTRAVENOUS

## 2021-08-19 MED ORDER — HEPARIN 6000 UNIT IRRIGATION SOLUTION
Status: DC | PRN
  Administered 2021-08-19: 1

## 2021-08-19 MED ORDER — PROTAMINE SULFATE 10 MG/ML IV SOLN
INTRAVENOUS | Status: DC | PRN
  Administered 2021-08-19: 40 mg via INTRAVENOUS

## 2021-08-19 MED ORDER — OXYCODONE HCL 5 MG/5ML PO SOLN: 5.0000 mg | Freq: Once | ORAL | Status: DC | PRN

## 2021-08-19 MED ORDER — GUAIFENESIN-DM 100-10 MG/5ML PO SYRP: 15.0000 mL | ORAL_SOLUTION | ORAL | Status: DC | PRN

## 2021-08-19 MED ORDER — DEXAMETHASONE SODIUM PHOSPHATE 10 MG/ML IJ SOLN
INTRAMUSCULAR | Status: DC | PRN
  Administered 2021-08-19: 8 mg via INTRAVENOUS

## 2021-08-19 MED ORDER — ROCURONIUM BROMIDE 10 MG/ML (PF) SYRINGE
PREFILLED_SYRINGE | INTRAVENOUS | Status: AC
  Filled 2021-08-19: qty 10

## 2021-08-19 MED ORDER — ORAL CARE MOUTH RINSE: 15.0000 mL | Freq: Once | OROMUCOSAL | Status: AC

## 2021-08-19 MED ORDER — CEFAZOLIN SODIUM-DEXTROSE 2-4 GM/100ML-% IV SOLN
INTRAVENOUS | Status: AC
  Filled 2021-08-19: qty 100

## 2021-08-19 MED ORDER — LIDOCAINE HCL (PF) 1 % IJ SOLN
INTRAMUSCULAR | Status: AC
  Filled 2021-08-19: qty 5

## 2021-08-19 MED ORDER — GLYCOPYRROLATE 0.2 MG/ML IJ SOLN
INTRAMUSCULAR | Status: DC | PRN
  Administered 2021-08-19: .2 mg via INTRAVENOUS

## 2021-08-19 MED ORDER — PHENYLEPHRINE 80 MCG/ML (10ML) SYRINGE FOR IV PUSH (FOR BLOOD PRESSURE SUPPORT)
PREFILLED_SYRINGE | INTRAVENOUS | Status: AC
  Filled 2021-08-19: qty 10

## 2021-08-19 MED ORDER — DEXTRAN 40 IN SALINE 10-0.9 % IV SOLN
INTRAVENOUS | Status: AC | PRN
  Administered 2021-08-19: 500 mL

## 2021-08-19 MED ORDER — 0.9 % SODIUM CHLORIDE (POUR BTL) OPTIME
TOPICAL | Status: DC | PRN
  Administered 2021-08-19: 2000 mL

## 2021-08-19 MED ORDER — ACETAMINOPHEN 325 MG PO TABS
325.0000 mg | ORAL_TABLET | ORAL | Status: DC | PRN
  Administered 2021-08-19 – 2021-08-20 (×3): 650 mg via ORAL
  Filled 2021-08-19 (×3): qty 2

## 2021-08-19 MED ORDER — OXYCODONE HCL 5 MG PO TABS: 5.0000 mg | ORAL_TABLET | Freq: Once | ORAL | Status: DC | PRN

## 2021-08-19 MED ORDER — ONDANSETRON HCL 4 MG/2ML IJ SOLN
INTRAMUSCULAR | Status: DC | PRN
  Administered 2021-08-19: 4 mg via INTRAVENOUS

## 2021-08-19 MED ORDER — GLYCOPYRROLATE PF 0.2 MG/ML IJ SOSY
PREFILLED_SYRINGE | INTRAMUSCULAR | Status: AC
  Filled 2021-08-19: qty 1

## 2021-08-19 MED ORDER — CHLORHEXIDINE GLUCONATE 0.12 % MT SOLN: 15.0000 mL | Freq: Once | OROMUCOSAL | Status: AC

## 2021-08-19 MED ORDER — ALUM & MAG HYDROXIDE-SIMETH 200-200-20 MG/5ML PO SUSP: 15.0000 mL | ORAL | Status: DC | PRN

## 2021-08-19 MED ORDER — FENTANYL CITRATE (PF) 250 MCG/5ML IJ SOLN
INTRAMUSCULAR | Status: DC | PRN
Start: 2021-08-19 — End: 2021-08-19
  Administered 2021-08-19 (×2): 50 ug via INTRAVENOUS

## 2021-08-19 MED ORDER — HYDRALAZINE HCL 20 MG/ML IJ SOLN: 5.0000 mg | INTRAMUSCULAR | Status: DC | PRN

## 2021-08-19 MED ORDER — METOPROLOL TARTRATE 5 MG/5ML IV SOLN: 2.0000 mg | INTRAVENOUS | Status: DC | PRN

## 2021-08-19 MED ORDER — EPHEDRINE SULFATE (PRESSORS) 50 MG/ML IJ SOLN
INTRAMUSCULAR | Status: DC | PRN
  Administered 2021-08-19 (×5): 5 mg via INTRAVENOUS

## 2021-08-19 MED ORDER — MORPHINE SULFATE (PF) 2 MG/ML IV SOLN: 2.0000 mg | INTRAVENOUS | Status: DC | PRN

## 2021-08-19 MED ORDER — LIDOCAINE-EPINEPHRINE (PF) 1 %-1:200000 IJ SOLN
INTRAMUSCULAR | Status: DC | PRN
  Administered 2021-08-19: 30 mL

## 2021-08-19 MED ORDER — LIDOCAINE-EPINEPHRINE (PF) 1 %-1:200000 IJ SOLN
INTRAMUSCULAR | Status: AC
  Filled 2021-08-19: qty 30

## 2021-08-19 MED ORDER — LACTATED RINGERS IV SOLN: INTRAVENOUS | Status: DC

## 2021-08-19 MED ORDER — DILTIAZEM HCL ER COATED BEADS 180 MG PO CP24
180.0000 mg | ORAL_CAPSULE | Freq: Every day | ORAL | Status: DC
  Administered 2021-08-20: 180 mg via ORAL
  Filled 2021-08-19 (×2): qty 1

## 2021-08-19 MED ORDER — LACTATED RINGERS IV SOLN: INTRAVENOUS | Status: DC | PRN

## 2021-08-19 MED ORDER — DOCUSATE SODIUM 100 MG PO CAPS
100.0000 mg | ORAL_CAPSULE | Freq: Every day | ORAL | Status: DC
  Administered 2021-08-20: 100 mg via ORAL
  Filled 2021-08-19: qty 1

## 2021-08-19 SURGICAL SUPPLY — 52 items
BAG COUNTER SPONGE SURGICOUNT (BAG) ×2 IMPLANT
BAG DECANTER FOR FLEXI CONT (MISCELLANEOUS) ×2 IMPLANT
CANISTER SUCT 3000ML PPV (MISCELLANEOUS) ×2 IMPLANT
CANNULA VESSEL 3MM 2 BLNT TIP (CANNULA) ×6 IMPLANT
CATH ROBINSON RED A/P 18FR (CATHETERS) ×2 IMPLANT
CLIP VESOCCLUDE MED 24/CT (CLIP) ×2 IMPLANT
CLIP VESOCCLUDE SM WIDE 24/CT (CLIP) ×2 IMPLANT
DERMABOND ADVANCED (GAUZE/BANDAGES/DRESSINGS) ×1
DERMABOND ADVANCED .7 DNX12 (GAUZE/BANDAGES/DRESSINGS) ×1 IMPLANT
DRAIN CHANNEL 15F RND FF W/TCR (WOUND CARE) IMPLANT
ELECT REM PT RETURN 9FT ADLT (ELECTROSURGICAL) ×2
ELECTRODE REM PT RTRN 9FT ADLT (ELECTROSURGICAL) ×1 IMPLANT
EVACUATOR SILICONE 100CC (DRAIN) IMPLANT
GLOVE BIO SURGEON STRL SZ 6 (GLOVE) ×1 IMPLANT
GLOVE BIO SURGEON STRL SZ7.5 (GLOVE) ×3 IMPLANT
GLOVE BIOGEL PI IND STRL 6 (GLOVE) IMPLANT
GLOVE BIOGEL PI IND STRL 6.5 (GLOVE) IMPLANT
GLOVE BIOGEL PI IND STRL 8 (GLOVE) ×1 IMPLANT
GLOVE BIOGEL PI INDICATOR 6 (GLOVE) ×1
GLOVE BIOGEL PI INDICATOR 6.5 (GLOVE) ×1
GLOVE BIOGEL PI INDICATOR 8 (GLOVE) ×1
GLOVE SURG POLY ORTHO LF SZ7.5 (GLOVE) IMPLANT
GLOVE SURG UNDER LTX SZ8 (GLOVE) ×2 IMPLANT
GOWN STRL REUS W/ TWL LRG LVL3 (GOWN DISPOSABLE) ×3 IMPLANT
GOWN STRL REUS W/ TWL XL LVL3 (GOWN DISPOSABLE) IMPLANT
GOWN STRL REUS W/TWL LRG LVL3 (GOWN DISPOSABLE) ×8
GOWN STRL REUS W/TWL XL LVL3 (GOWN DISPOSABLE) ×2
GRAFT VASC PATCH XENOSURE 1X14 (Vascular Products) ×1 IMPLANT
KIT BASIN OR (CUSTOM PROCEDURE TRAY) ×2 IMPLANT
KIT SHUNT ARGYLE CAROTID ART 6 (VASCULAR PRODUCTS) ×1 IMPLANT
KIT TURNOVER KIT B (KITS) ×2 IMPLANT
NDL HYPO 25GX1X1/2 BEV (NEEDLE) ×1 IMPLANT
NEEDLE HYPO 25GX1X1/2 BEV (NEEDLE) ×2 IMPLANT
NS IRRIG 1000ML POUR BTL (IV SOLUTION) ×4 IMPLANT
PACK CAROTID (CUSTOM PROCEDURE TRAY) ×2 IMPLANT
PAD ARMBOARD 7.5X6 YLW CONV (MISCELLANEOUS) ×4 IMPLANT
POSITIONER HEAD DONUT 9IN (MISCELLANEOUS) ×2 IMPLANT
SET WALTER ACTIVATION W/DRAPE (SET/KITS/TRAYS/PACK) IMPLANT
SHUNT CAROTID BYPASS 10 (VASCULAR PRODUCTS) IMPLANT
SHUNT CAROTID BYPASS 12 (VASCULAR PRODUCTS) IMPLANT
SPONGE SURGIFOAM ABS GEL 100 (HEMOSTASIS) IMPLANT
SUT MNCRL AB 4-0 PS2 18 (SUTURE) ×2 IMPLANT
SUT PROLENE 6 0 BV (SUTURE) ×6 IMPLANT
SUT SILK 2 0 PERMA HAND 18 BK (SUTURE) IMPLANT
SUT SILK 3 0 (SUTURE) ×2
SUT SILK 3-0 18XBRD TIE 12 (SUTURE) IMPLANT
SUT VIC AB 3-0 SH 27 (SUTURE) ×2
SUT VIC AB 3-0 SH 27X BRD (SUTURE) ×1 IMPLANT
SYR 20ML LL LF (SYRINGE) ×2 IMPLANT
SYR CONTROL 10ML LL (SYRINGE) ×2 IMPLANT
TOWEL GREEN STERILE (TOWEL DISPOSABLE) ×2 IMPLANT
WATER STERILE IRR 1000ML POUR (IV SOLUTION) ×2 IMPLANT

## 2021-08-19 NOTE — Plan of Care (Signed)

## 2021-08-19 NOTE — Anesthesia Procedure Notes (Signed)
Procedure Name: Intubation ?Date/Time: 08/19/2021 7:40 AM ?Performed by: Inda Coke, CRNA ?Pre-anesthesia Checklist: Patient identified, Emergency Drugs available, Suction available and Patient being monitored ?Patient Re-evaluated:Patient Re-evaluated prior to induction ?Oxygen Delivery Method: Circle System Utilized ?Preoxygenation: Pre-oxygenation with 100% oxygen ?Induction Type: IV induction ?Ventilation: Mask ventilation without difficulty and Oral airway inserted - appropriate to patient size ?Laryngoscope Size: Mac and 3 ?Grade View: Grade II ?Tube type: Oral ?Tube size: 7.5 mm ?Number of attempts: 1 ?Airway Equipment and Method: Stylet and Oral airway ?Placement Confirmation: ETT inserted through vocal cords under direct vision, positive ETCO2 and breath sounds checked- equal and bilateral ?Secured at: 21 cm ?Tube secured with: Tape ?Dental Injury: Teeth and Oropharynx as per pre-operative assessment  ? ? ? ? ?

## 2021-08-19 NOTE — Progress Notes (Signed)
Pt tolerated food well ?NIHSS negative ?Pt ambulated to the bathroom and back to void ? ?Will continue to monitor and update ?

## 2021-08-19 NOTE — Anesthesia Postprocedure Evaluation (Signed)
Anesthesia Post Note ? ?Patient: Vanessa [name] ? ?Procedure(s) Performed: LEFT CAROTID ENDARTERECTOMY WITH 1CM BY 14CM XENOSURE ANGIOPLASTY PATCH (Left: Neck) ? ?  ? ?Patient location during evaluation: PACU ?Anesthesia Type: General ?Level of consciousness: awake and alert ?Pain management: pain level controlled ?Vital Signs Assessment: post-procedure vital signs reviewed and stable ?Respiratory status: spontaneous breathing, nonlabored ventilation, respiratory function stable and patient connected to nasal cannula oxygen ?Cardiovascular status: blood pressure returned to baseline and stable ?Postop Assessment: no apparent nausea or vomiting ?Anesthetic complications: no ? ? ?No notable events documented. ? ?Last Vitals:  ?Vitals:  ? 08/19/21 1035 08/19/21 1050  ?BP: (!) 113/43 (!) 111/47  ?Pulse: (!) 59 62  ?Resp: 14 12  ?Temp:    ?SpO2: 93% 93%  ?  ?Last Pain:  ?Vitals:  ? 08/19/21 1035  ?TempSrc:   ?PainSc: 0-No pain  ? ? ?  ?  ?  ?  ?  ?  ? ?Audry Pili ? ? ? ? ?

## 2021-08-19 NOTE — Progress Notes (Signed)
? ?  VASCULAR SURGERY POSTOP:  ? ?Doing well postop.  Her blood pressure is a little bit soft so were holding her blood pressure medicines. ? ?Resume Eliquis tomorrow.  She should also stay on 81 mg of aspirin. ? ?Anticipate discharge tomorrow. ? ?SUBJECTIVE:  ? ?No specific complaints. ? ?PHYSICAL EXAM:  ? ?Vitals:  ? 08/19/21 1348 08/19/21 1400 08/19/21 1415 08/19/21 1430  ?BP: (!) 118/49 (!) 121/54 (!) 114/48 (!) 115/51  ?Pulse: 63 60 60 (!) 59  ?Resp: '16 15 20 15  '$ ?Temp: 97.7 ?F (36.5 ?C)     ?TempSrc: Oral     ?SpO2: 90% 90% 91% 94%  ?Weight:      ?Height:      ? ?Some mild marginal mandibular nerve dysfunction.  This is not unexpected given the high bifurcation and the need for significant distal retraction. ? ?LABS:  ? ?Lab Results  ?Component Value Date  ? WBC 10.1 08/14/2021  ? HGB 11.9 (L) 08/14/2021  ? HCT 35.7 (L) 08/14/2021  ? MCV 92.7 08/14/2021  ? PLT 182 08/14/2021  ? ?PROBLEM LIST:   ? ?Principal Problem: ?  Carotid artery stenosis ? ? ?CURRENT MEDS:  ? ? aspirin EC  81 mg Oral Daily  ? diltiazem  180 mg Oral Daily  ? [START ON 08/20/2021] docusate sodium  100 mg Oral Daily  ? hydrochlorothiazide  12.5 mg Oral Daily  ? irbesartan  150 mg Oral Daily  ? pantoprazole  40 mg Oral Daily  ? rosuvastatin  10 mg Oral Daily  ? ? ?Deitra Mayo ?Office: [phone] ?08/19/2021 ? ?

## 2021-08-19 NOTE — Anesthesia Procedure Notes (Signed)
Arterial Line Insertion ?Start/End5/12/2021 7:00 AM, 08/19/2021 7:05 AM ?Performed by: Inda Coke, CRNA, CRNA ? Patient location: Pre-op. ?Preanesthetic checklist: patient identified, IV checked, site marked, risks and benefits discussed, surgical consent, monitors and equipment checked, pre-op evaluation, timeout performed and anesthesia consent ?Lidocaine 1% used for infiltration ?Right, radial was placed ?Catheter size: 20 G ?Hand hygiene performed  and maximum sterile barriers used  ?Allen's test indicative of satisfactory collateral circulation ?Attempts: 1 ?Procedure performed without using ultrasound guided technique. ?Following insertion, dressing applied and Biopatch. ?Post procedure assessment: normal ? ?Patient tolerated the procedure well with no immediate complications. ? ? ?

## 2021-08-19 NOTE — Op Note (Signed)
? ? ?NAME: Vanessa [name]    MRN: [id number] ?DOB: 1950-06-18    DATE OF OPERATION: 08/19/2021 ? ?PREOP DIAGNOSIS:   ? ?Asymptomatic greater than 80% left carotid stenosis ? ?POSTOP DIAGNOSIS:   ? ?Same ? ?PROCEDURE:  ?  ?Left carotid endarterectomy with bovine pericardial patch angioplasty ? ?SURGEON: Judeth Cornfield. Scot Dock, MD ? ?ASSIST: Arlee Muslim, PA ? ?ANESTHESIA: General ? ?EBL: Minimal ? ?INDICATIONS:  ? ? Vanessa [name] is a 71 y.o. female who was followed with a left carotid stenosis which progressed to greater than 80%.  Left carotid endarterectomy was recommended in order to lower her risk of future stroke.  Of note, her preoperative duplex on 07/21/2021 showed a 40 to 59% right carotid stenosis. ? ?FINDINGS:  ? ?90% hemorrhagic plaque in the left carotid bifurcation.  There was a long tongue of plaque which extended quite low into the common carotid artery and required a fairly long endarterectomy and patch. ? ?TECHNIQUE:  ? ?The patient was taken to the operating room after an arterial line was placed by anesthesia.  The patient received a general anesthetic.  The left neck was prepped and draped in usual sterile fashion.  A longitudinal incision was made along the anterior border of the sternocleidomastoid and the dissection carried down to the common carotid artery which was dissected free and controlled with Rummel tourniquet.  The patient was heparinized.  ACT was monitored throughout the procedure.  The facial vein was divided between 2-0 silk ties.  I then followed the internal carotid artery above the plaque where it was controlled with a vessel loop.  The external carotid artery was controlled with a vessel loop.  There was a large branch at the bifurcation which I suspect was a pharyngeal branch which was controlled with a red vessel loop. ? ?Clamps were then placed on the internal and the common and the external carotid artery.  A longitudinal arteriotomy was made into the common carotid artery  I could not get through the plaque so had to make an arteriotomy above the plaque and then connected to.  This allowed me to place a 8 shunt proximally as the artery was fairly small.  The shunt was then placed into the common carotid artery and secured with Rummel tourniquet.  Flow was just reestablished to the shunt and checked with a Doppler and there was good flow. ? ?An endarterectomy plane was established proximally.  Had to go very low on the common carotid artery get below the plaque.  Eversion endarterectomy was performed of the external carotid artery.  Distally there was a nice tapering the plaque in the internal carotid artery and no tacking sutures were required.  The artery was irrigated with copious amounts of heparin and dextran and all loose debris removed.  A long bovine pericardial patch was then sewn using continuous 6-0 Prolene suture.  Prior to completing the patch closure the shunt was removed.  The artery was backbled and flushed appropriately and the anastomosis completed.  Flow was reestablished first to the external carotid artery and into the internal carotid artery.  There is an excellent Doppler signal with good diastolic flow at the completion.  The heparin was partially reversed with protamine. ? ?Hemostasis was obtained in the wound and the wound was closed with a deep layer of 3-0 Vicryl.  The platysma was closed with running 3-0 Vicryl.  The skin was closed with 4-0 Monocryl.  Dermabond was applied.  The patient tolerated the procedure  well was transferred to the recovery room in stable condition.  All needle and sponge counts were correct. ? ?Given the complexity of the case,  the assistant was necessary in order to expedient the procedure and safely perform the technical aspects of the operation.  The assistant provided traction and countertraction to assist with exposure of the common carotid artery, external carotid artery, and internal carotid artery.  They also assisted with  suture ligatures and dividing the facial vein and multiple small venous branches tethering the hypoglossal nerve. The assistant also played a critical role in placing the shunt safely.  In addition they were necessary to provide adequate traction and countertraction to perform a precise endarterectomy and precise closure.  These skills, especially following the Prolene suture for the anastomosis, could not have been adequately performed by a scrub tech assistant.  ? ? ?Deitra Mayo, MD, FACS ?[facility] ? ?DATE OF DICTATION:   08/19/2021 ? ?

## 2021-08-19 NOTE — Interval H&P Note (Signed)
History and Physical Interval Note: ? ?08/19/2021 ?7:10 AM ? ?Vanessa [name]  has presented today for surgery, with the diagnosis of Left carotid artery stenosis.  The various methods of treatment have been discussed with the patient and family. After consideration of risks, benefits and other options for treatment, the patient has consented to  Procedure(s): ?LEFT CAROTID ENDARTERECTOMY (Left) as a surgical intervention.  The patient's history has been reviewed, patient examined, no change in status, stable for surgery.  I have reviewed the patient's chart and labs.  Questions were answered to the patient's satisfaction.   ? ? ?Deitra Mayo ? ? ?

## 2021-08-19 NOTE — Transfer of Care (Signed)
Immediate Anesthesia Transfer of Care Note ? ?Patient: Vanessa [name] ? ?Procedure(s) Performed: LEFT CAROTID ENDARTERECTOMY WITH 1CM BY 14CM XENOSURE ANGIOPLASTY PATCH (Left: Neck) ? ?Patient Location: PACU ? ?Anesthesia Type:General ? ?Level of Consciousness: awake, alert  and oriented ? ?Airway & Oxygen Therapy: Patient Spontanous Breathing and Patient connected to nasal cannula oxygen ? ?Post-op Assessment: Report given to RN, Post -op Vital signs reviewed and stable, Patient moving all extremities X 4 and Patient able to stick tongue midline ? ?Post vital signs: Reviewed and stable ? ?Last Vitals:  ?Vitals Value Taken Time  ?BP 127/43 08/19/21 1004  ?Temp    ?Pulse 61 08/19/21 1009  ?Resp 17 08/19/21 1009  ?SpO2 94 % 08/19/21 1009  ?Vitals shown include unvalidated device data. ? ?Last Pain:  ?Vitals:  ? 08/19/21 0557  ?TempSrc: Oral  ?PainSc:   ?   ? ?  ? ?Complications: No notable events documented. ?

## 2021-08-19 NOTE — Progress Notes (Signed)
Pt voided again ?Pt hemodynamically stable ?NIHSS negative ? ?Pt BP soft and BP meds held, MD made aware and alright with it ? ?Will continue to monitor ?

## 2021-08-20 ENCOUNTER — Encounter (HOSPITAL_COMMUNITY): Payer: Self-pay | Admitting: Vascular Surgery

## 2021-08-20 LAB — CBC
HCT: 28.5 % — ABNORMAL LOW (ref 36.0–46.0)
Hemoglobin: 9.4 g/dL — ABNORMAL LOW (ref 12.0–15.0)
MCH: 30.5 pg (ref 26.0–34.0)
MCHC: 33 g/dL (ref 30.0–36.0)
MCV: 92.5 fL (ref 80.0–100.0)
Platelets: 140 10*3/uL — ABNORMAL LOW (ref 150–400)
RBC: 3.08 MIL/uL — ABNORMAL LOW (ref 3.87–5.11)
RDW: 14.1 % (ref 11.5–15.5)
WBC: 6.6 10*3/uL (ref 4.0–10.5)
nRBC: 0 % (ref 0.0–0.2)

## 2021-08-20 LAB — BASIC METABOLIC PANEL
Anion gap: 3 — ABNORMAL LOW (ref 5–15)
BUN: 22 mg/dL (ref 8–23)
CO2: 26 mmol/L (ref 22–32)
Calcium: 8.9 mg/dL (ref 8.9–10.3)
Chloride: 109 mmol/L (ref 98–111)
Creatinine, Ser: 1.29 mg/dL — ABNORMAL HIGH (ref 0.44–1.00)
GFR, Estimated: 44 mL/min — ABNORMAL LOW (ref >60)
Glucose, Bld: 166 mg/dL — ABNORMAL HIGH (ref 70–99)
Potassium: 4.8 mmol/L (ref 3.5–5.1)
Sodium: 138 mmol/L (ref 135–145)

## 2021-08-20 MED ORDER — HYDROCODONE-ACETAMINOPHEN 5-325 MG PO TABS: 1.0000 | ORAL_TABLET | Freq: Four times a day (QID) | ORAL | 0 refills | Status: DC | PRN

## 2021-08-20 NOTE — Progress Notes (Signed)
? ?  VASCULAR SURGERY ASSESSMENT & PLAN:  ? ?POD 1 LEFT CEA: Patient is doing well with no complaints.  She is ambulating and eating without difficulty.  Neuro is intact. ? ?VASCULAR QUALITY INITIATIVE: She is on aspirin and is on a statin. ? ?A-FIB: She will resume her Eliquis tonight. ? ?Okay for discharge today. ? ?SUBJECTIVE:  ? ?No complaints this morning. ? ?PHYSICAL EXAM:  ? ?Vitals:  ? 08/20/21 0000 08/20/21 0049 08/20/21 0400 08/20/21 0449  ?BP: (!) 125/53 (!) 125/53 (!) 114/52 (!) 114/52  ?Pulse: (!) 58 (!) 58 69 69  ?Resp: '15 15 20 20  '$ ?Temp:  98 ?F (36.7 ?C) 97.9 ?F (36.6 ?C) 97.9 ?F (36.6 ?C)  ?TempSrc:   Oral   ?SpO2: 95% 95% 96% 96%  ?Weight:      ?Height:      ? ?NEURO: Intact ?Her incision looks fine. ? ?LABS:  ? ?Lab Results  ?Component Value Date  ? WBC 6.6 08/20/2021  ? HGB 9.4 (L) 08/20/2021  ? HCT 28.5 (L) 08/20/2021  ? MCV 92.5 08/20/2021  ? PLT 140 (L) 08/20/2021  ? ?Lab Results  ?Component Value Date  ? CREATININE 1.29 (H) 08/20/2021  ? ?Lab Results  ?Component Value Date  ? INR 1.3 (H) 08/14/2021  ? ? ?PROBLEM LIST:   ? ?Principal Problem: ?  Carotid artery stenosis ? ? ?CURRENT MEDS:  ? ? aspirin EC  81 mg Oral Daily  ? diltiazem  180 mg Oral Daily  ? docusate sodium  100 mg Oral Daily  ? hydrochlorothiazide  12.5 mg Oral Daily  ? irbesartan  150 mg Oral Daily  ? pantoprazole  40 mg Oral Daily  ? rosuvastatin  10 mg Oral Daily  ? ? ?Vanessa [name] ?Office: [phone] ?08/20/2021 ? ?

## 2021-08-20 NOTE — Discharge Summary (Signed)
?Discharge Summary ? ? ? ? ?Vanessa [name] ?12-14-1950 71 y.o. female ? ?[id number] ? ?Admission Date: ?08/19/2021 ? ?Discharge Date: ?08/20/21 ? ?Physician: ?Dr. Scot Dock ? ?Admission Diagnosis: ?Carotid artery stenosis [I65.29] ? ?Discharge Day services:   ? See progress note 5/10 ? ?Hospital Course:  ?The patient was admitted to the hospital and taken to the operating room on 08/19/2021 and underwent left carotid endarterectomy by Dr. Scot Dock.  The pt tolerated the procedure well and was transported to the PACU in good condition.  POD #1 patient's neuro exam remained at baseline.  She was ready for discharge home.  She was prescribed 1 to 2 days of narcotic pain medication for continued postoperative pain control.  She will follow-up in the office in about 2 to 3 weeks.  She was discharged home in stable condition. ? ? ? ?Recent Labs  ?  08/20/21 ?0244  ?NA 138  ?K 4.8  ?CL 109  ?CO2 26  ?GLUCOSE 166*  ?BUN 22  ?CALCIUM 8.9  ? ?Recent Labs  ?  08/20/21 ?0244  ?WBC 6.6  ?HGB 9.4*  ?HCT 28.5*  ?PLT 140*  ? ?No results for input(s): INR in the last 72 hours. ? ? ? ? ?Discharge Diagnosis:  ?Carotid artery stenosis [I65.29] ? ?Secondary Diagnosis: ?Patient Active Problem List  ? Diagnosis Date Noted  ? Carotid artery stenosis 08/19/2021  ? 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 CAD (coronary artery disease) 07/04/2020  ? Abnormal cardiac CT angiography 07/01/2020  ? Pain of right thigh 02/04/2018  ? Low back pain 02/04/2018  ? Pain of joint of left ankle and foot 01/19/2018  ? Ankle pain 01/19/2018  ? Foot pain 01/19/2018  ? Family history of premature CAD 08/05/2017  ? Atrial fibrillation ([place]) 07/19/2017  ? GERD without esophagitis 07/19/2017  ? Mixed dyslipidemia 07/19/2017  ? SVT (supraventricular tachycardia) ([place]) 11/09/2016  ? Left knee pain 11/01/2014  ? Primary osteoarthritis of left knee 11/01/2014  ? Chest pain 12/03/2011  ? HTN (hypertension) 12/03/2011  ? Hyperlipidemia 12/03/2011  ? ?Past Medical History:  ?Diagnosis Date   ? Anemia   ? Anxiety   ? AR (allergic rhinitis)   ? Arthritis   ? knees  ? Atrial fibrillation ([place])   ? Blood transfusion without reported diagnosis   ? Carotid artery disease ([place])   ? Cataract   ? bilateral /removed  ? Coronary artery disease   ? Depressed   ? Dysrhythmia   ? atrial fibrillation off and on  ? GERD (gastroesophageal reflux disease)   ? Glaucoma   ? HTN (hypertension)   ? Hyperlipidemia   ? Overweight(278.02)   ? ? ?Allergies as of 08/20/2021   ? ?   Reactions  ? Codeine Nausea Only  ? ?  ? ?  ?Medication List  ?  ? ?TAKE these medications   ? ?Alive Once Daily Womens 50+ Tabs ?Take 1 tablet by mouth daily. ?  ?apixaban 5 MG Tabs tablet ?Commonly known as: Eliquis ?Take 1 tablet (5 mg total) by mouth 2 (two) times daily. ?  ?aspirin EC 81 MG tablet ?Take 81 mg by mouth daily. Swallow whole. ?  ?Blink Tears 0.25 % Soln ?Generic drug: Polyethylene Glycol 400 ?Place 1 drop into both eyes daily as needed (Dry eyes). ?  ?CALTRATE 600+D PO ?Take 600 mg by mouth daily. ?  ?clotrimazole-betamethasone cream ?Commonly known as: LOTRISONE ?Apply 1 application. topically 2 (two) times daily as needed (irritation). ?  ?Coenzyme Q10 100  MG capsule ?Take 100 mg by mouth daily. ?  ?diltiazem 180 MG 24 hr capsule ?Commonly known as: CARDIZEM CD ?Take 1 capsule (180 mg total) by mouth daily. ?  ?esomeprazole 40 MG capsule ?Commonly known as: NexIUM ?Take 1 capsule (40 mg total) by mouth daily. ?  ?HYDROcodone-acetaminophen 5-325 MG tablet ?Commonly known as: Norco ?Take 1 tablet by mouth every 6 (six) hours as needed for moderate pain. ?  ?rosuvastatin 10 MG tablet ?Commonly known as: CRESTOR ?Take 1 tablet (10 mg total) by mouth daily. ?  ?sulfamethoxazole-trimethoprim 800-160 MG tablet ?Commonly known as: BACTRIM DS ?Take 1 tablet by mouth 2 (two) times daily for 5 days. ?  ?valsartan-hydrochlorothiazide 160-12.5 MG tablet ?Commonly known as: Diovan HCT ?Take 1 tablet by mouth daily. ?  ?vitamin C 1000 MG  tablet ?Take 1,000 mg by mouth daily. ?  ?Vitamin D3 50 MCG (2000 UT) Tabs ?Take 2,000 Units by mouth daily. ?  ?Zinc 50 MG Caps ?Take 50 mg by mouth daily. ?  ? ?  ? ? ? ?Discharge Instructions: ? ? ?[facility] ?Discharge Instructions Carotid Endarterectomy (CEA) ? ?Please refer to the following instructions for your post-procedure care. Your surgeon or physician assistant will discuss any changes with you. ? ?Activity ? ?You are encouraged to walk as much as you can. You can slowly return to normal activities but must avoid strenuous activity and heavy lifting until your doctor tell you it's OK. Avoid activities such as vacuuming or swinging a golf club. You can drive after one week if you are comfortable and you are no longer taking prescription pain medications. It is normal to feel tired for serval weeks after your surgery. It is also normal to have difficulty with sleep habits, eating, and bowel movements after surgery. These will go away with time. ? ?Bathing/Showering ? ?You may shower after you come home. Do not soak in a bathtub, hot tub, or swim until the incision heals completely. ? ?Incision Care ? ?Shower every day. Clean your incision with mild soap and water. Pat the area dry with a clean towel. You do not need a bandage unless otherwise instructed. Do not apply any ointments or creams to your incision. You may have skin glue on your incision. Do not peel it off. It will come off on its own in about one week. Your incision may feel thickened and raised for several weeks after your surgery. This is normal and the skin will soften over time. For Men Only: It's OK to shave around the incision but do not shave the incision itself for 2 weeks. It is common to have numbness under your chin that could last for several months. ? ?Diet ? ?Resume your normal diet. There are no special food restrictions following this procedure. A low fat/low cholesterol diet is recommended for  all patients with vascular disease. In order to heal from your surgery, it is CRITICAL to get adequate nutrition. Your body requires vitamins, minerals, and protein. Vegetables are the best source of vitamins and minerals. Vegetables also provide the perfect balance of protein. Processed food has little nutritional value, so try to avoid this. ? ?Medications ? ?Resume taking all of your medications unless your doctor or physician assistant tells you not to.  If your incision is causing pain, you may take over-the- counter pain relievers such as acetaminophen (Tylenol). If you were prescribed a stronger pain medication, please be aware these medications can cause nausea and constipation.  Prevent nausea by taking the medication with a snack or meal. Avoid constipation by drinking plenty of fluids and eating foods with a high amount of fiber, such as fruits, vegetables, and grains. Do not take Tylenol if you are taking prescription pain medications. ? ?Follow Up ? ?Our office will schedule a follow up appointment 2-3 weeks following discharge. ? ?Please call us immediately for any of the following conditions ? ?Increased pain, redness, drainage (pus) from your incision site. ?Fever of 101 degrees or higher. ?If you should develop stroke (slurred speech, difficulty swallowing, weakness on one side of your body, loss of vision) you should call 911 and go to the nearest emergency room. ? ?Reduce your risk of vascular disease: ? ?Stop smoking. If you would like help call [facility] at 1-800-QUIT-NOW [phone]) or [place] at [phone]. ?Manage your cholesterol ?Maintain a desired weight ?Control your diabetes ?Keep your blood pressure down ? ?If you have any questions, please call the office at [phone]. ? ?Disposition: home ? ?Patient's condition: is Good ? ?Follow up: ?1. [facility] in 2-3 weeks. ? ? ?Dagoberto Ligas, PA-C ?[facility] ?[phone] ? ? ?--- For [facility] use --- ?   ?Modified Rankin score at D/C (0-6): 0 ? ?IV medication needed for:  ?1. Hypertension: No ?2. Hypotension: No ? ?Post-op Complications: No ? ?1. Post-op CVA or TIA: No ? ?If yes: Event classification (right eye, le

## 2021-08-20 NOTE — Discharge Instructions (Signed)
   [facility] [place]  Discharge Instructions   Carotid Endarterectomy (CEA)  Please refer to the following instructions for your post-procedure care. Your surgeon or physician assistant will discuss any changes with you.  Activity  You are encouraged to walk as much as you can. You can slowly return to normal activities but must avoid strenuous activity and heavy lifting until your doctor tell you it's OK. Avoid activities such as vacuuming or swinging a golf club. You can drive after one week if you are comfortable and you are no longer taking prescription pain medications. It is normal to feel tired for serval weeks after your surgery. It is also normal to have difficulty with sleep habits, eating, and bowel movements after surgery. These will go away with time.  Bathing/Showering  You may shower after you come home. Do not soak in a bathtub, hot tub, or swim until the incision heals completely.  Incision Care  Shower every day. Clean your incision with mild soap and water. Pat the area dry with a clean towel. You do not need a bandage unless otherwise instructed. Do not apply any ointments or creams to your incision. You may have skin glue on your incision. Do not peel it off. It will come off on its own in about one week. Your incision may feel thickened and raised for several weeks after your surgery. This is normal and the skin will soften over time. For Men Only: It's OK to shave around the incision but do not shave the incision itself for 2 weeks. It is common to have numbness under your chin that could last for several months.  Diet  Resume your normal diet. There are no special food restrictions following this procedure. A low fat/low cholesterol diet is recommended for all patients with vascular disease. In order to heal from your surgery, it is CRITICAL to get adequate nutrition. Your body requires vitamins, minerals, and protein. Vegetables are the best  source of vitamins and minerals. Vegetables also provide the perfect balance of protein. Processed food has little nutritional value, so try to avoid this.        Medications  Resume taking all of your medications unless your doctor or physician assistant tells you not to. If your incision is causing pain, you may take over-the- counter pain relievers such as acetaminophen (Tylenol). If you were prescribed a stronger pain medication, please be aware these medications can cause nausea and constipation. Prevent nausea by taking the medication with a snack or meal. Avoid constipation by drinking plenty of fluids and eating foods with a high amount of fiber, such as fruits, vegetables, and grains. Do not take Tylenol if you are taking prescription pain medications.  Follow Up  Our office will schedule a follow up appointment 2-3 weeks following discharge.  Please call us immediately for any of the following conditions  Increased pain, redness, drainage (pus) from your incision site. Fever of 101 degrees or higher. If you should develop stroke (slurred speech, difficulty swallowing, weakness on one side of your body, loss of vision) you should call 911 and go to the nearest emergency room.  Reduce your risk of vascular disease:  Stop smoking. If you would like help call [facility] at 1-800-QUIT-NOW (1-800-784-8669) or [place] at [phone]. Manage your cholesterol Maintain a desired weight Control your diabetes Keep your blood pressure down  If you have any questions, please call the office at [phone].   

## 2021-08-20 NOTE — TOC Transition Note (Signed)
Transition of Care (TOC) - CM/SW Discharge Note ?Marvetta Gibbons Therapist, sports, BSN ?Transitions of Care ?Unit 4E- RN Case Manager ?See Treatment Team for direct phone #  ? ? ?Patient Details  ?Name: Vanessa [name] ?MRN: [id number] ?Date of Birth: Aug 12, 1950 ? ?Transition of Care (TOC) CM/SW Contact:  ?Dahlia Client, Romeo Rabon, RN ?Phone Number: ?08/20/2021, 10:25 AM ? ? ?Clinical Narrative:    ?Pt stable for transition home today s/p left CEA. Transition of Care Department [facility]) has reviewed patient and no TOC needs have been identified at this time.  Family to transport home ? ?Final next level of care: Home/Self Care ?Barriers to Discharge: No Barriers Identified ? ? ?Patient Goals and CMS Choice ?  ?  ?Choice offered to / list presented to : NA ? ?Discharge Placement ?  ?           ?  ? Home ?  ?  ? ?Discharge Plan and Services ?In-house Referral: NA ?Discharge Planning Services: NA ?Post Acute Care Choice: NA          ?DME Arranged: N/A ?DME Agency: NA ?  ?  ?  ?HH Arranged: NA ?[place] Agency: NA ?  ?  ?  ? ?Social Determinants of Health (SDOH) Interventions ?  ? ? ?Readmission Risk Interventions ? ?  08/20/2021  ? 10:25 AM 07/16/2020  ? 12:02 PM  ?Readmission Risk Prevention Plan  ?Post Dischage Appt Complete Complete  ?Medication Screening Complete Complete  ?Transportation Screening Complete Complete  ? ? ? ? ? ?

## 2021-08-21 LAB — POCT ACTIVATED CLOTTING TIME: Activated Clotting Time: 263 s

## 2021-09-02 ENCOUNTER — Encounter: Payer: PPO | Admitting: Vascular Surgery

## 2021-09-11 ENCOUNTER — Encounter: Payer: Self-pay | Admitting: Vascular Surgery

## 2021-09-11 ENCOUNTER — Ambulatory Visit (INDEPENDENT_AMBULATORY_CARE_PROVIDER_SITE_OTHER): Payer: PPO | Admitting: Vascular Surgery

## 2021-09-11 VITALS — BP 152/74 | HR 55 | Temp 98.3°F | Ht 68.0 in | Wt 189.0 lb

## 2021-09-11 DIAGNOSIS — I6523 Occlusion and stenosis of bilateral carotid arteries: Secondary | ICD-10-CM

## 2021-09-11 NOTE — Progress Notes (Signed)
Patient name: Vanessa [name] MRN: [id number] DOB: 29-Nov-1950 Sex: female  REASON FOR VISIT:   Follow-up after left carotid endarterectomy  HPI:   Vanessa [name] is a pleasant 71 y.o. female who I been following with a left carotid stenosis which progressed to greater than 80%.  Left carotid endarterectomy was recommended and she underwent left carotid endarterectomy with bovine pericardial patch angioplasty on 08/19/2021.  Of note she had a 40 to 59% right carotid stenosis.  She did well postoperatively and was discharged on postoperative day #1.  Of note it was a very long plaque that extended quite low.  She was started back on her Eliquis for A-fib.  She  Since I saw her last, she denies any focal weakness or paresthesias.  Her slight paresthesias on the left lower lip are improving.  She had some slight marginal mandibular nerve dysfunction postop.  Current Outpatient Medications  Medication Sig Dispense Refill   apixaban (ELIQUIS) 5 MG TABS tablet Take 1 tablet (5 mg total) by mouth 2 (two) times daily. 60 tablet 5   Ascorbic Acid (VITAMIN C) 1000 MG tablet Take 1,000 mg by mouth daily.     aspirin EC 81 MG tablet Take 81 mg by mouth daily. Swallow whole.     Calcium Carbonate-Vitamin D (CALTRATE 600+D PO) Take 600 mg by mouth daily.     Cholecalciferol (VITAMIN D3) 50 MCG (2000 UT) TABS Take 2,000 Units by mouth daily.     clotrimazole-betamethasone (LOTRISONE) cream Apply 1 application. topically 2 (two) times daily as needed (irritation).     Coenzyme Q10 100 MG capsule Take 100 mg by mouth daily.     diltiazem (CARDIZEM CD) 180 MG 24 hr capsule Take 1 capsule (180 mg total) by mouth daily. 90 capsule 2   esomeprazole (NEXIUM) 40 MG capsule Take 1 capsule (40 mg total) by mouth daily. 30 capsule 6   Multiple Vitamins-Minerals (ALIVE ONCE DAILY WOMENS 50+) TABS Take 1 tablet by mouth daily.     Polyethylene Glycol 400 (BLINK TEARS) 0.25 % SOLN Place 1 drop into both eyes daily as  needed (Dry eyes).     rosuvastatin (CRESTOR) 10 MG tablet Take 1 tablet (10 mg total) by mouth daily. 90 tablet 3   valsartan-hydrochlorothiazide (DIOVAN HCT) 160-12.5 MG tablet Take 1 tablet by mouth daily. 90 tablet 3   Zinc 50 MG CAPS Take 50 mg by mouth daily.      HYDROcodone-acetaminophen (NORCO) 5-325 MG tablet Take 1 tablet by mouth every 6 (six) hours as needed for moderate pain. (Patient not taking: Reported on 09/11/2021) 15 tablet 0   No current facility-administered medications for this visit.    REVIEW OF SYSTEMS:  '[X]'$  denotes positive finding, '[ ]'$  denotes negative finding Vascular    Leg swelling    Cardiac    Chest pain or chest pressure:    Shortness of breath upon exertion:    Short of breath when lying flat:    Irregular heart rhythm:    Constitutional    Fever or chills:     PHYSICAL EXAM:   Vitals:   09/11/21 0838 09/11/21 0840  BP: (!) 147/71 (!) 152/74  Pulse: (!) 55   Temp: 98.3 F (36.8 C)   SpO2: 98%   Weight: 189 lb (85.7 kg)   Height: '5\' 8"'$  (1.727 m)     GENERAL: The patient is a well-nourished female, in no acute distress. The vital signs are documented above. CARDIOVASCULAR: There is  a regular rate and rhythm. PULMONARY: There is good air exchange bilaterally without wheezing or rales. VASCULAR: I do not detect carotid bruits. NEURO: She has no focal weakness or paresthesias.  DATA:   No new data  MEDICAL ISSUES:   S/P LEFT CAROTID ENDARTERECTOMY: Patient is doing well status post left carotid endarterectomy.  She has a 40 to 59% right carotid stenosis.  She is on aspirin and a statin.  I ordered a follow-up carotid duplex scan in 9 months and she will be seen on the PA schedule at that time.  If the right carotid stenosis is stable she can be followed on a yearly basis unless it progresses.  Deitra Mayo [facility] [place] [phone]

## 2021-09-17 ENCOUNTER — Other Ambulatory Visit: Payer: Self-pay | Admitting: *Deleted

## 2021-09-17 DIAGNOSIS — I6523 Occlusion and stenosis of bilateral carotid arteries: Secondary | ICD-10-CM

## 2021-10-24 ENCOUNTER — Other Ambulatory Visit: Payer: Self-pay

## 2021-10-24 MED ORDER — DILTIAZEM HCL ER COATED BEADS 180 MG PO CP24: 180.0000 mg | ORAL_CAPSULE | Freq: Every day | ORAL | 2 refills | Status: DC

## 2021-12-20 ENCOUNTER — Other Ambulatory Visit: Payer: Self-pay | Admitting: Cardiology

## 2021-12-20 DIAGNOSIS — I480 Paroxysmal atrial fibrillation: Secondary | ICD-10-CM

## 2021-12-22 NOTE — Telephone Encounter (Signed)
Prescription refill request for Eliquis received. Indication:Afib Last office visit:4/23 Scr:1.2 Age: 71 Weight:85.7 kg  Prescription refilled

## 2021-12-29 ENCOUNTER — Other Ambulatory Visit: Payer: Self-pay | Admitting: *Deleted

## 2021-12-29 MED ORDER — VALSARTAN-HYDROCHLOROTHIAZIDE 160-12.5 MG PO TABS: 1.0000 | ORAL_TABLET | Freq: Every day | ORAL | 2 refills | Status: DC

## 2022-01-02 ENCOUNTER — Telehealth: Payer: Self-pay | Admitting: Gastroenterology

## 2022-01-02 NOTE — Telephone Encounter (Signed)
Pt stated that she recently had her Gallbladder  removed in August  and at the end of August she started to develop severe abdominal pain. Pt had an xray done by her PCP and a CT scan done by her Surgeon and was treated for Diverticulitis: Pt stated that she was treated with two antibiotics and is feeling much better now: Pt requesting follow up visit: Pt was scheduled with Tye Savoy NP on 02/03/2022 at 9:15: Pt made aware: Pt verbalized understanding with all questions answered.

## 2022-01-02 NOTE — Telephone Encounter (Signed)
Patient called requesting to speak with a nurse regarding Diverticulitis symptoms

## 2022-01-05 ENCOUNTER — Other Ambulatory Visit: Payer: Self-pay

## 2022-01-05 MED ORDER — ROSUVASTATIN CALCIUM 10 MG PO TABS: 10.0000 mg | ORAL_TABLET | Freq: Every day | ORAL | 0 refills | Status: DC

## 2022-02-03 ENCOUNTER — Encounter: Payer: Self-pay | Admitting: Nurse Practitioner

## 2022-02-03 ENCOUNTER — Ambulatory Visit (INDEPENDENT_AMBULATORY_CARE_PROVIDER_SITE_OTHER): Payer: PPO | Admitting: Nurse Practitioner

## 2022-02-03 VITALS — BP 124/66 | HR 63 | Ht 68.0 in | Wt 180.0 lb

## 2022-02-03 DIAGNOSIS — K219 Gastro-esophageal reflux disease without esophagitis: Secondary | ICD-10-CM | POA: Diagnosis not present

## 2022-02-03 DIAGNOSIS — K5732 Diverticulitis of large intestine without perforation or abscess without bleeding: Secondary | ICD-10-CM | POA: Diagnosis not present

## 2022-02-03 MED ORDER — FAMOTIDINE 40 MG PO TABS: 40.0000 mg | ORAL_TABLET | Freq: Every day | ORAL | 5 refills | Status: DC

## 2022-02-03 NOTE — Patient Instructions (Addendum)
_______________________________________________________  If you are age 73 or older, your body mass index should be between 23-30. Your Body mass index is 27.37 kg/m. If this is out of the aforementioned range listed, please consider follow up with your Primary Care Provider.  If you are age 58 or younger, your body mass index should be between 19-25. Your Body mass index is 27.37 kg/m. If this is out of the aformentioned range listed, please consider follow up with your Primary Care Provider.   ________________________________________________________  The [place] [facility] providers would like to encourage you to use [facility] to communicate with providers for non-urgent requests or questions.  Due to long hold times on the telephone, sending your provider a message by [facility] may be a faster and more efficient way to get a response.  Please allow 48 business hours for a response.  Please remember that this is for non-urgent requests.  _______________________________________________________    GERD Acid Reflux  Below are some measures you can take to possibly improve acid reflux symptoms . We may have discussed some of these today in the office. Not everything on this list may apply to you    --Avoid late meals / bedtime snacks.   --Avoid trigger foods ( foods which you know tend to aggravate you reflux symptoms). Some common trigger foods include spicy foods, fatty foods, acidic foods, chocolate and caffeine.  --Elevate the head of bed 6-8 inches on blocks or bricks. If not able to elevate the head of the bed consider purchasing a wedge pillow to sleep on.    --Weight reduction / maintain a healthy BMI ( body mass index) may be help with reflux symptoms  --Sometimes with the above mentioned "lifestyle changes" patients are able to reduce the amount of GERD medications they take. Our goal is to have you on the lowest effective dose of medication  Will stop Nexium. Try Pepcid 40 mg every morning. If  you get recurrent symptoms then may need to go back to taking Nexium. Otherwise, in a few months if doing well you can try decreasing to 20 mg daily ( over the counter).    It was a pleasure to see you today!  Thank you for trusting me with your gastrointestinal care!

## 2022-02-03 NOTE — Progress Notes (Signed)
Chief Complaint:   follow up on diverticulitis (CT scan with [facility])   [place]   # 71 yo female here for follow up after being treated for a first episode of diverticulitis ( CT done at [facility] in late August). She pulled up CT scan on her phone Results reviewed, there was uncomplicated sigmoid diverticulitis, interval cholecystectomy with a 4.5 x2.5 x2.6 cm post surgical fluid collection.  Treated with diet changes and antibiotics. Feeling better now. Known sigmoid diverticulosis on otherwise unremarkable colonoscopy in May 2020.  Need to liberalize diet. Start slowly reintroducing fiber back into diet.  Contact our office for any recurrent symptoms.   # Chronic GERD. She is concerned about possible long term side effects of Nexium Discussed anti-reflux measures such as avoidance of late meals / bedtime snacks, HOB elevation (or use of wedge pillow), weight reduction ( if applicable)  / maintaining a healthy BMI ( body mass index),  and avoidance of trigger foods and caffeine.  Will stop Nexium. Try Pepcid 40 mg every morning. If gets recurrent symptoms then may need to go back to taking Nexium. Otherwise, in a few months if doing well can try decreasing to 20 mg daily ( over the counter).   HPI  Vanessa [name] is a 72 year old female with a past medical history significant for paroxysmal atrial fibrillation on Eliquis CAD status post CABG 2022,  pacemaker,  hypertension,  HLD anxiety/depression,  diverticulosis, gastritis, hepatic steatosis , cholecystectomy  Vanessa [name] was last seen by Dr. Lyndel Safe in the office December 2022 for evaluation of GERD, CT scan showing gastric wall thickening, and RLQ pain felt to be musculoskeletal.  She subsequently underwent an EGD which showed a gastritis and a small hiatal hernia.    Interval History:  Vanessa [name] called the office last month .  She had a cholecystectomy in August and a day or two later she developed LLQ pain. Actually thinking back  she had some lower abdominal pain going into the surgery. CT scan ordered by her Surgeon showed uncomplicated sigmoid  diverticulitis.  She was treated with 7 days of antibiotics and clear liquids. It took a while but she is feeling better. She is still eating a limited diet. Not eating much fiber and not eating beans.  Having about two BMs . She was having some minor rectal bleeding and perianal irritation several weeks ago, symptoms have resolved.  She has a history of hemorrhoids  CTAP with IV 8/31 at [facility] Interval cholecystectomy with post surgical changes with low density fluid collection measuring 4.5 x2.5 x2.6 cm.  Sigmoid wall thickening with moderate surrounding soft tissue stranding.  No associated fluid collection.    Previous GI Evaluation   May 2020 colonoscopy for rectal bleeding -A few small mouth diverticula in sigmoid and ascending colon.  Nonbleeding internal hemorrhoids.  Exam otherwise normal.  Terminal ileum appeared normal.  Repeat colonoscopy in 10 years  Feb 2023 EGD - Small sliding hiatal hernia. - Gastritis. Biopsied. - Non-bleeding duodenal diverticulum  Surgical [P], fundus, gastric antrum, and gastric body - UNREMARKABLE ANTRAL AND OXYNTIC MUCOSA. - NO HELICOBACTER PYLORI IDENTIFIED.   Past Medical History:  Diagnosis Date   Anemia    Anxiety    AR (allergic rhinitis)    Arthritis    knees   Atrial fibrillation (HCC)    Blood transfusion without reported diagnosis    Carotid artery disease ([place])    Cataract    bilateral /removed   Coronary artery  disease    Depressed    Dysrhythmia    atrial fibrillation off and on   GERD (gastroesophageal reflux disease)    Glaucoma    HTN (hypertension)    Hyperlipidemia    Overweight(278.02)     Past Surgical History:  Procedure Laterality Date   ABDOMINAL HYSTERECTOMY     CARDIAC CATHETERIZATION     CATARACT EXTRACTION     catherization  02/1999   normal   CLIPPING OF ATRIAL APPENDAGE N/A  07/10/2020   Procedure: CLIPPING OF ATRIAL APPENDAGE;  Surgeon: Melrose Nakayama, MD;  Location: [place];  Service: Open Heart Surgery;  Laterality: N/A;   COLONOSCOPY     CORONARY ARTERY BYPASS GRAFT N/A 07/10/2020   Procedure: CORONARY ARTERY BYPASS GRAFTING (CABG)x5. LEFT INTERNAL MAMMARY ARTERY. RIGHT SAPHENOUS VEIN HARVESTING. LEFT ATRIAL APPENDAGE CLIPPING.;  Surgeon: Melrose Nakayama, MD;  Location: [place];  Service: Open Heart Surgery;  Laterality: N/A;   cyrotherapy     ENDARTERECTOMY Left 08/19/2021   Procedure: LEFT CAROTID ENDARTERECTOMY WITH 1CM BY 14CM XENOSURE ANGIOPLASTY PATCH;  Surgeon: Angelia Mould, MD;  Location: [place];  Service: Vascular;  Laterality: Left;   EP IMPLANTABLE DEVICE N/A 03/02/2016   Procedure: Loop Recorder Insertion;  Surgeon: Will [name], MD;  Location: [place] [facility];  Service: Cardiovascular;  Laterality: N/A;   EYE SURGERY     x2   GLAUCOMA SURGERY     bilateral   KNEE ARTHROSCOPY     left knee   LEFT HEART CATH AND CORONARY ANGIOGRAPHY N/A 07/01/2020   Procedure: LEFT HEART CATH AND CORONARY ANGIOGRAPHY;  Surgeon: Nelva Bush, MD;  Location: [place] [facility];  Service: Cardiovascular;  Laterality: N/A;   ROTATOR CUFF REPAIR     right shoulder   TEE WITHOUT CARDIOVERSION N/A 07/10/2020   Procedure: TRANSESOPHAGEAL ECHOCARDIOGRAM (TEE);  Surgeon: Melrose Nakayama, MD;  Location: [place];  Service: Open Heart Surgery;  Laterality: N/A;   TONSILLECTOMY     removed as a tennager   UPPER GASTROINTESTINAL ENDOSCOPY     WISDOM TOOTH EXTRACTION      Current Medications, Allergies, Family History and Social History were reviewed in [employer] record.     Current Outpatient Medications  Medication Sig Dispense Refill   Ascorbic Acid (VITAMIN C) 1000 MG tablet Take 1,000 mg by mouth daily.     aspirin EC 81 MG tablet Take 81 mg by mouth daily. Swallow whole.     Calcium Carbonate-Vitamin D  (CALTRATE 600+D PO) Take 600 mg by mouth daily.     Cholecalciferol (VITAMIN D3) 50 MCG (2000 UT) TABS Take 2,000 Units by mouth daily.     clotrimazole-betamethasone (LOTRISONE) cream Apply 1 application. topically 2 (two) times daily as needed (irritation).     Coenzyme Q10 100 MG capsule Take 100 mg by mouth daily.     diltiazem (CARDIZEM CD) 180 MG 24 hr capsule Take 1 capsule (180 mg total) by mouth daily. 90 capsule 2   ELIQUIS 5 MG TABS tablet TAKE 1 TABLET(5 MG) BY MOUTH TWICE DAILY 60 tablet 5   esomeprazole (NEXIUM) 40 MG capsule Take 1 capsule (40 mg total) by mouth daily. 30 capsule 6   HYDROcodone-acetaminophen (NORCO) 5-325 MG tablet Take 1 tablet by mouth every 6 (six) hours as needed for moderate pain. (Patient not taking: Reported on 09/11/2021) 15 tablet 0   Multiple Vitamins-Minerals (ALIVE ONCE DAILY WOMENS 50+) TABS Take 1 tablet by mouth  daily.     Polyethylene Glycol 400 (BLINK TEARS) 0.25 % SOLN Place 1 drop into both eyes daily as needed (Dry eyes).     rosuvastatin (CRESTOR) 10 MG tablet Take 1 tablet (10 mg total) by mouth daily. 30 tablet 0   valsartan-hydrochlorothiazide (DIOVAN HCT) 160-12.5 MG tablet Take 1 tablet by mouth daily. 90 tablet 2   Zinc 50 MG CAPS Take 50 mg by mouth daily.      No current facility-administered medications for this visit.    Review of Systems: No chest pain. No shortness of breath. No urinary complaints.    Physical Exam  Wt Readings from Last 3 Encounters:  09/11/21 189 lb (85.7 kg)  08/19/21 189 lb 1.6 oz (85.8 kg)  08/14/21 189 lb 1.6 oz (85.8 kg)    BP 124/66   Pulse 63   Ht '5\' 8"'$  (1.727 m)   Wt 180 lb (81.6 kg)   SpO2 99%   BMI 27.37 kg/m  Constitutional:  Generally well appearing female in no acute distress. Psychiatric: Pleasant. Normal mood and affect. Behavior is normal. EENT: Pupils normal.  Conjunctivae are normal. No scleral icterus. Neck supple.  Cardiovascular: Normal rate, regular rhythm. No  edema Pulmonary/chest: Effort normal and breath sounds normal. No wheezing, rales or rhonchi. Abdominal: Soft, nondistended, nontender. Bowel sounds active throughout. There are no masses palpable. No hepatomegaly. Neurological: Alert and oriented to person place and time. Skin: Skin is warm and dry. No rashes noted.  Vanessa Savoy, NP  02/03/2022, 8:21 AM

## 2022-02-15 NOTE — Progress Notes (Signed)
Agree with assessment/plan.  Raj Veasna Santibanez, MD [facility] [phone]  

## 2022-02-16 ENCOUNTER — Telehealth: Payer: Self-pay | Admitting: *Deleted

## 2022-02-16 MED ORDER — ROSUVASTATIN CALCIUM 10 MG PO TABS: 10.0000 mg | ORAL_TABLET | Freq: Every day | ORAL | 1 refills | Status: DC

## 2022-02-16 NOTE — Telephone Encounter (Signed)
Rx refill sent to pharmacy. 

## 2022-07-09 ENCOUNTER — Ambulatory Visit: Payer: PPO | Admitting: Physician Assistant

## 2022-07-09 ENCOUNTER — Ambulatory Visit (HOSPITAL_COMMUNITY)
Admission: RE | Admit: 2022-07-09 | Discharge: 2022-07-09 | Disposition: A | Discharge status: Home / Self Care | Payer: PPO | Source: Ambulatory Visit | Attending: Vascular Surgery | Admitting: Vascular Surgery

## 2022-07-09 VITALS — BP 144/68 | HR 53 | Temp 97.8°F | Wt 182.0 lb

## 2022-07-09 DIAGNOSIS — I6523 Occlusion and stenosis of bilateral carotid arteries: Secondary | ICD-10-CM

## 2022-07-09 NOTE — Progress Notes (Signed)
Office Note     CC:  follow up Requesting Provider:  Myrlene Broker, MD  HPI: Vanessa [name] is a 72 y.o. (1950-11-18) female who presents for surveillance of carotid artery stenosis.  She underwent left carotid endarterectomy by Dr. Scot Dock on 08/19/2021 due to asymptomatic high-grade stenosis.  She denies any neurological events since last office visit.  She also denies any current slurring speech, changes in vision, or one-sided weakness.  She states she has occasional dizziness with exertion and at rest ever since she had a CABG 2 years ago.  She is unsure of the etiology.  She is on Eliquis for atrial fibrillation.  She is also on an aspirin and statin daily.   Past Medical History:  Diagnosis Date   Anemia    Anxiety    AR (allergic rhinitis)    Arthritis    knees   Atrial fibrillation (HCC)    Blood transfusion without reported diagnosis    Carotid artery disease ([place])    Cataract    bilateral /removed   Coronary artery disease    Depressed    Dysrhythmia    atrial fibrillation off and on   GERD (gastroesophageal reflux disease)    Glaucoma    HTN (hypertension)    Hyperlipidemia    Overweight(278.02)     Past Surgical History:  Procedure Laterality Date   ABDOMINAL HYSTERECTOMY     CARDIAC CATHETERIZATION     CATARACT EXTRACTION     catherization  02/1999   normal   CLIPPING OF ATRIAL APPENDAGE N/A 07/10/2020   Procedure: CLIPPING OF ATRIAL APPENDAGE;  Surgeon: Melrose Nakayama, MD;  Location: [place];  Service: Open Heart Surgery;  Laterality: N/A;   COLONOSCOPY     CORONARY ARTERY BYPASS GRAFT N/A 07/10/2020   Procedure: CORONARY ARTERY BYPASS GRAFTING (CABG)x5. LEFT INTERNAL MAMMARY ARTERY. RIGHT SAPHENOUS VEIN HARVESTING. LEFT ATRIAL APPENDAGE CLIPPING.;  Surgeon: Melrose Nakayama, MD;  Location: [place];  Service: Open Heart Surgery;  Laterality: N/A;   cyrotherapy     ENDARTERECTOMY Left 08/19/2021   Procedure: LEFT CAROTID ENDARTERECTOMY WITH 1CM  BY 14CM XENOSURE ANGIOPLASTY PATCH;  Surgeon: Angelia Mould, MD;  Location: [place];  Service: Vascular;  Laterality: Left;   EP IMPLANTABLE DEVICE N/A 03/02/2016   Procedure: Loop Recorder Insertion;  Surgeon: Will [name], MD;  Location: [place] [facility];  Service: Cardiovascular;  Laterality: N/A;   EYE SURGERY     x2   GLAUCOMA SURGERY     bilateral   KNEE ARTHROSCOPY     left knee   LEFT HEART CATH AND CORONARY ANGIOGRAPHY N/A 07/01/2020   Procedure: LEFT HEART CATH AND CORONARY ANGIOGRAPHY;  Surgeon: Nelva Bush, MD;  Location: [place] [facility];  Service: Cardiovascular;  Laterality: N/A;   ROTATOR CUFF REPAIR     right shoulder   TEE WITHOUT CARDIOVERSION N/A 07/10/2020   Procedure: TRANSESOPHAGEAL ECHOCARDIOGRAM (TEE);  Surgeon: Melrose Nakayama, MD;  Location: [place];  Service: Open Heart Surgery;  Laterality: N/A;   TONSILLECTOMY     removed as a tennager   UPPER GASTROINTESTINAL ENDOSCOPY     WISDOM TOOTH EXTRACTION      Social History   Socioeconomic History   Marital status: Married    Spouse name: Not on file   Number of children: Not on file   Years of education: Not on file   Highest education level: Not on file  Occupational History   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 Helps take care of her mother.    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   Social Determinants of Health   Financial Resource Strain: Not on file  Food Insecurity: Not on file  Transportation Needs: Not on file  Physical Activity: Not on file  Stress: Not on file  Social Connections: Not on file  Intimate Partner Violence:  Not on file    Family History  Problem Relation Age of Onset   Stroke Mother    Heart attack Father    Colon cancer Neg Hx    Colon polyps Neg Hx    Esophageal cancer Neg Hx    Rectal cancer Neg Hx    Stomach cancer Neg Hx     Current Outpatient Medications  Medication Sig Dispense Refill   Ascorbic Acid (VITAMIN C) 1000 MG tablet Take 1,000 mg by mouth daily.     aspirin EC 81 MG tablet Take 81 mg by mouth daily. Swallow whole.     Calcium Carbonate-Vitamin D (CALTRATE 600+D PO) Take 600 mg by mouth daily.     Cholecalciferol (VITAMIN D3) 50 MCG (2000 UT) TABS Take 2,000 Units by mouth daily.     clotrimazole-betamethasone (LOTRISONE) cream Apply 1 application. topically 2 (two) times daily as needed (irritation).     Coenzyme Q10 100 MG capsule Take 100 mg by mouth daily.     diltiazem (CARDIZEM CD) 180 MG 24 hr capsule Take 1 capsule (180 mg total) by mouth daily. 90 capsule 2   ELIQUIS 5 MG TABS tablet TAKE 1 TABLET(5 MG) BY MOUTH TWICE DAILY 60 tablet 5   famotidine (PEPCID) 40 MG tablet Take 1 tablet (40 mg total) by mouth daily. 30 tablet 5   Multiple Vitamins-Minerals (ALIVE ONCE DAILY WOMENS 50+) TABS Take 1 tablet by mouth daily.     Polyethylene Glycol 400 (BLINK TEARS) 0.25 % SOLN Place 1 drop into both eyes daily as needed (Dry eyes).     Probiotic Product (PROBIOTIC DAILY PO) Take by mouth. Taking over the counter Floragen     rosuvastatin (CRESTOR) 10 MG tablet Take 1 tablet (10 mg total) by mouth daily. 90 tablet 1   valsartan-hydrochlorothiazide (DIOVAN HCT) 160-12.5 MG tablet Take 1 tablet by mouth daily. 90 tablet 2   Zinc 50 MG CAPS Take 50 mg by mouth daily.      No current facility-administered medications for this visit.    Allergies  Allergen Reactions   Codeine Nausea Only     REVIEW OF SYSTEMS:   [X]  denotes positive finding, [ ]  denotes negative finding Cardiac  Comments:  Chest pain or chest pressure:    Shortness of breath upon exertion:     Short of breath when lying flat:    Irregular heart rhythm:        Vascular    Pain in calf, thigh, or hip brought on by ambulation:    Pain in feet at night that wakes you up from your sleep:     Blood clot in your veins:  Leg swelling:         Pulmonary    Oxygen at home:    Productive cough:     Wheezing:         Neurologic    Sudden weakness in arms or legs:     Sudden numbness in arms or legs:     Sudden onset of difficulty speaking or slurred speech:    Temporary loss of vision in one eye:     Problems with dizziness:         Gastrointestinal    Blood in stool:     Vomited blood:         Genitourinary    Burning when urinating:     Blood in urine:        Psychiatric    Major depression:         Hematologic    Bleeding problems:    Problems with blood clotting too easily:        Skin    Rashes or ulcers:        Constitutional    Fever or chills:      PHYSICAL EXAMINATION:  Vitals:   07/09/22 0953 07/09/22 0954  BP: (!) 151/67 (!) 144/68  Pulse: (!) 53   Temp: 97.8 F (36.6 C)   TempSrc: Temporal   SpO2: 99%   Weight: 182 lb (82.6 kg)     General:  WDWN in NAD; vital signs documented above Gait: Not observed HENT: WNL, normocephalic Pulmonary: normal non-labored breathing , without Rales, rhonchi,  wheezing Cardiac: regular HR Abdomen: soft, NT, no masses Skin: without rashes Vascular Exam/Pulses: Symmetrical radial pulses Extremities: without ischemic changes, without Gangrene , without cellulitis; without open wounds;  Musculoskeletal: no muscle wasting or atrophy  Neurologic: A&O X 3; CN grossly intact Psychiatric:  The pt has normal affect.   Non-Invasive Vascular Imaging:   Right ICA 1 to 39% stenosis Left carotid endarterectomy site widely patent Antegrade right vertebral Antegrade left vertebral but with stenosis   ASSESSMENT/PLAN:: 72 y.o. female here for follow up for carotid artery surveillance   -Subjectively, patient  has been without any neurological symptoms since last office visit.  Left lower lip weakness has improved since surgery.  She continues to have dizziness of unknown etiology.  She has antegrade vertebral arteries.  Blood pressure is within 15 mmHg from right to left arm.  Left carotid endarterectomy site widely patent.  Right ICA with only 1 to 39% stenosis.  We will repeat carotid duplex in 1 year.  She will continue her aspirin and statin daily.  She knows to call/return office sooner with any questions or concerns.   Dagoberto Ligas, PA-C [facility] [phone]  Clinic MD:   Scot Dock

## 2022-07-20 ENCOUNTER — Other Ambulatory Visit: Payer: Self-pay | Admitting: Cardiology

## 2022-07-20 DIAGNOSIS — I480 Paroxysmal atrial fibrillation: Secondary | ICD-10-CM

## 2022-07-20 NOTE — Telephone Encounter (Signed)
Prescription refill request for Eliquis received. Indication:afib  Last office visit:[place], 07/15/2021 Scr:1.20, 03/06/2022 Age: 72 yo  Weight: 82.6 kg    Pt is scheduled to see Dr. Excell Seltzer on 08/10/2022

## 2022-08-05 ENCOUNTER — Telehealth: Payer: Self-pay | Admitting: Cardiovascular Disease

## 2022-08-05 ENCOUNTER — Other Ambulatory Visit: Payer: Self-pay

## 2022-08-05 IMAGING — CR DG CHEST 2V
2 series · 2 of 2 positions shown · non-contrast
Comparison: 10/28/2016

CLINICAL DATA: Preadmit CABG

EXAM:
CHEST - 2 VIEW

[w chest pa]
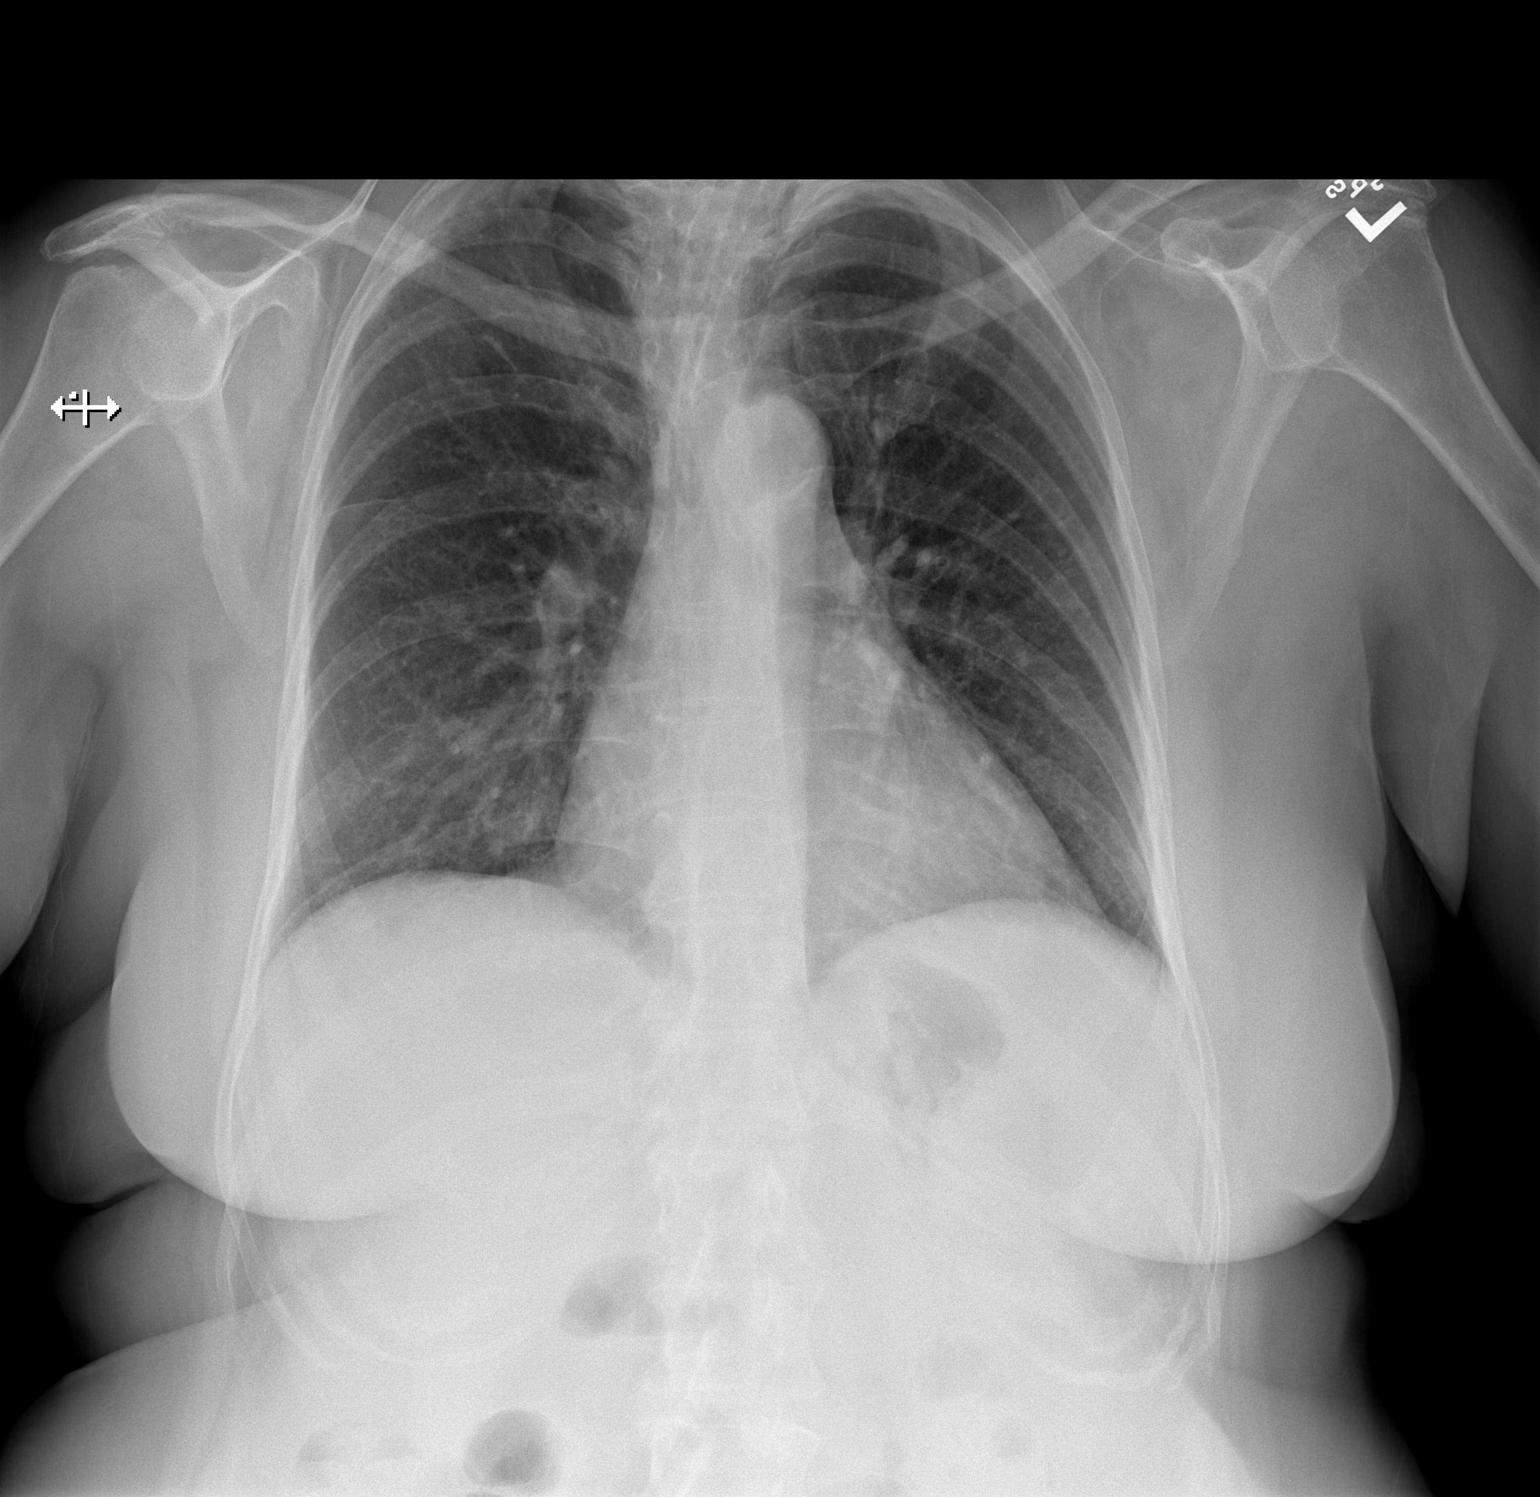

[w chest lat]
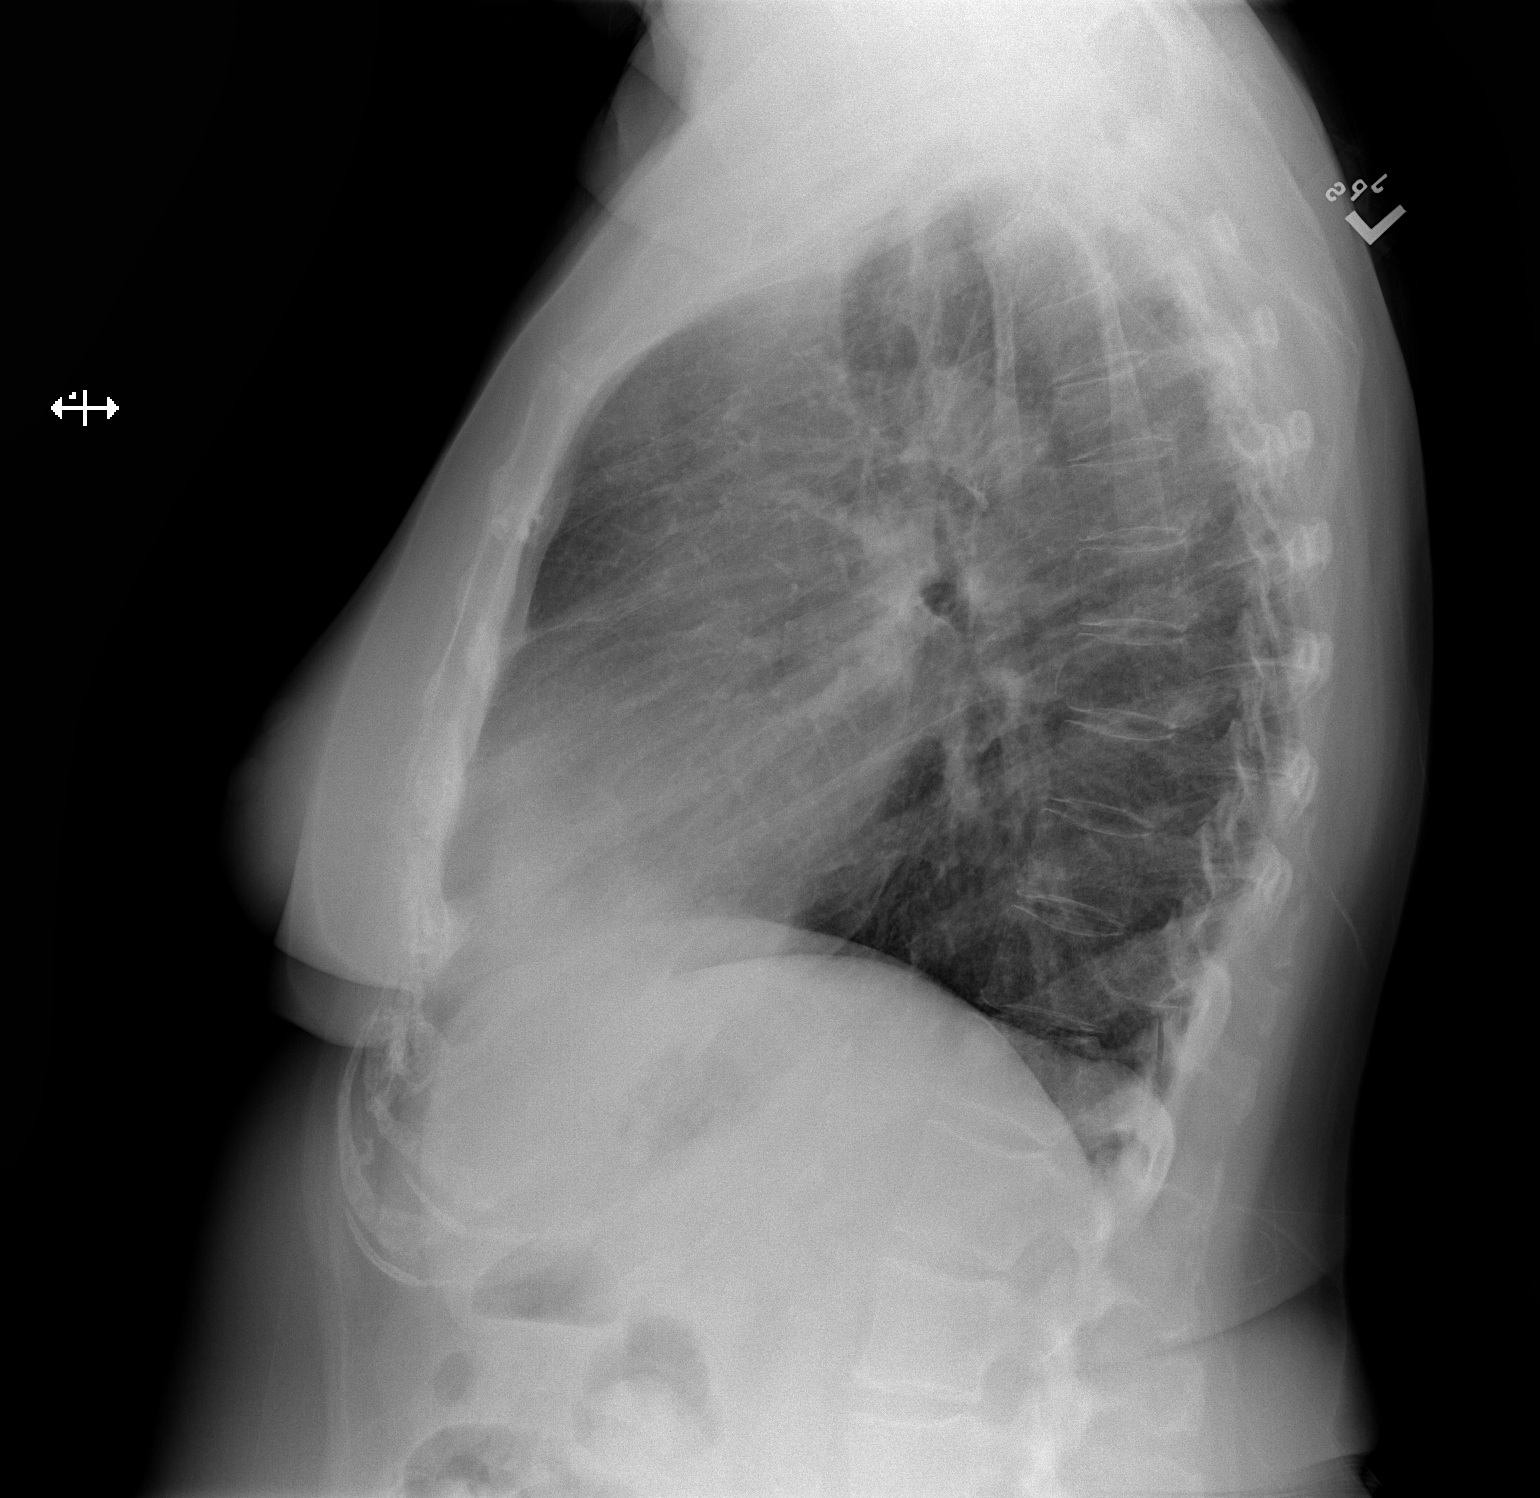

[2 of 2 positions shown; findings below may reference images not displayed]

FINDINGS: No focal opacity or pleural effusion. Stable cardiomediastinal
silhouette with aortic atherosclerosis. No pneumothorax
IMPRESSION: No active cardiopulmonary disease.

## 2022-08-05 MED ORDER — VALSARTAN-HYDROCHLOROTHIAZIDE 160-12.5 MG PO TABS
1.0000 | ORAL_TABLET | Freq: Every day | ORAL | 0 refills | Status: DC
Start: 2022-08-05 — End: 2022-08-10

## 2022-08-05 NOTE — Telephone Encounter (Signed)
*  STAT* If patient is at the pharmacy, call can be transferred to refill team.   1. Which medications need to be refilled? (please list name of each medication and dose if known) valsartan-hydrochlorothiazide (DIOVAN HCT) 160-12.5 MG tablet   2. Which pharmacy/location (including street and city if local pharmacy) is medication to be sent to?  [facility] - [place], [place] - [address]    3. Do they need a 30 day or 90 day supply? 90   Patient is out of medication and has appt on 4/29

## 2022-08-05 NOTE — Telephone Encounter (Signed)
Pt's medication was sent to pt's pharmacy as requested. Confirmation received.  °

## 2022-08-10 ENCOUNTER — Ambulatory Visit: Payer: PPO | Attending: Cardiovascular Disease | Admitting: Cardiovascular Disease

## 2022-08-10 ENCOUNTER — Encounter: Payer: Self-pay | Admitting: Cardiovascular Disease

## 2022-08-10 VITALS — BP 134/70 | HR 49 | Ht 68.0 in | Wt 181.8 lb

## 2022-08-10 DIAGNOSIS — I6523 Occlusion and stenosis of bilateral carotid arteries: Secondary | ICD-10-CM

## 2022-08-10 DIAGNOSIS — I480 Paroxysmal atrial fibrillation: Secondary | ICD-10-CM

## 2022-08-10 DIAGNOSIS — I2510 Atherosclerotic heart disease of native coronary artery without angina pectoris: Secondary | ICD-10-CM | POA: Diagnosis not present

## 2022-08-10 DIAGNOSIS — E782 Mixed hyperlipidemia: Secondary | ICD-10-CM

## 2022-08-10 DIAGNOSIS — I10 Essential (primary) hypertension: Secondary | ICD-10-CM | POA: Diagnosis not present

## 2022-08-10 MED ORDER — APIXABAN 5 MG PO TABS: ORAL_TABLET | ORAL | 3 refills | Status: DC

## 2022-08-10 MED ORDER — ROSUVASTATIN CALCIUM 20 MG PO TABS: 20.0000 mg | ORAL_TABLET | Freq: Every day | ORAL | 3 refills | Status: DC

## 2022-08-10 MED ORDER — DILTIAZEM HCL ER 120 MG PO CP12: 120.0000 mg | ORAL_CAPSULE | Freq: Every day | ORAL | 3 refills | Status: DC

## 2022-08-10 MED ORDER — VALSARTAN-HYDROCHLOROTHIAZIDE 160-12.5 MG PO TABS: 1.0000 | ORAL_TABLET | Freq: Every day | ORAL | 3 refills | Status: DC

## 2022-08-10 NOTE — Progress Notes (Signed)
Cardiology Office Note:    Date:  08/10/2022   ID:  Vanessa [name], DOB 23-Jan-1951, MRN [id number]  PCP:  Hadley Pen, MD   [facility] Providers Cardiologist:  Tonny Bollman, MD Electrophysiologist:  Will Jorja Loa, MD     Referring MD: Hadley Pen, MD   Chief Complaint  Patient presents with   Coronary Artery Disease    History of Present Illness:    Vanessa [name] is a 72 y.o. female with a hx of  paroxysmal atrial fibrillation, coronary artery disease, hypertension, and mixed hyperlipidemia.  In March 2022 she had undergone a coronary CTA demonstrating extensive CAD.  She ultimately underwent cardiac catheterization confirming severe multivessel coronary artery disease and she ultimately was treated with multivessel CABG and left atrial appendage clip placement July 10, 2020.  The patient's postoperative course was uncomplicated.  She then underwent left carotid endarterectomy in May 2023 by Dr Edilia Bo for treatment of severe asymptomatic carotid stenosis.   The patient is here alone today.  She has been doing pretty well but has noted some slow heart rates on her home heart monitor.  I was able to review her tracings today and they all demonstrate sinus rhythm, even though occasionally it identifies the rhythm is atrial fibrillation.  She has not had any heart palpitations.  She complains of generalized fatigue.  She has an occasional squeezing sensation in her chest that goes back towards the back.  No orthopnea, PND, or leg edema.  Past Medical History:  Diagnosis Date   Anemia    Anxiety    AR (allergic rhinitis)    Arthritis    knees   Atrial fibrillation (HCC)    Blood transfusion without reported diagnosis    Carotid artery disease (HCC)    Cataract    bilateral /removed   Coronary artery disease    Depressed    Dysrhythmia    atrial fibrillation off and on   GERD (gastroesophageal reflux disease)    Glaucoma    HTN (hypertension)     Hyperlipidemia    Overweight(278.02)     Past Surgical History:  Procedure Laterality Date   ABDOMINAL HYSTERECTOMY     CARDIAC CATHETERIZATION     CATARACT EXTRACTION     catherization  02/1999   normal   CLIPPING OF ATRIAL APPENDAGE N/A 07/10/2020   Procedure: CLIPPING OF ATRIAL APPENDAGE;  Surgeon: Loreli Slot, MD;  Location: [facility] OR;  Service: Open Heart Surgery;  Laterality: N/A;   COLONOSCOPY     CORONARY ARTERY BYPASS GRAFT N/A 07/10/2020   Procedure: CORONARY ARTERY BYPASS GRAFTING (CABG)x5. LEFT INTERNAL MAMMARY ARTERY. RIGHT SAPHENOUS VEIN HARVESTING. LEFT ATRIAL APPENDAGE CLIPPING.;  Surgeon: Loreli Slot, MD;  Location: [facility] OR;  Service: Open Heart Surgery;  Laterality: N/A;   cyrotherapy     ENDARTERECTOMY Left 08/19/2021   Procedure: LEFT CAROTID ENDARTERECTOMY WITH 1CM BY 14CM XENOSURE ANGIOPLASTY PATCH;  Surgeon: Chuck Hint, MD;  Location: [facility] OR;  Service: Vascular;  Laterality: Left;   EP IMPLANTABLE DEVICE N/A 03/02/2016   Procedure: Loop Recorder Insertion;  Surgeon: Will Jorja Loa, MD;  Location: [facility] INVASIVE CV LAB;  Service: Cardiovascular;  Laterality: N/A;   EYE SURGERY     x2   GLAUCOMA SURGERY     bilateral   KNEE ARTHROSCOPY     left knee   LEFT HEART CATH AND CORONARY ANGIOGRAPHY N/A 07/01/2020   Procedure: LEFT HEART CATH AND CORONARY ANGIOGRAPHY;  Surgeon:  End, Cristal Deer, MD;  Location: [facility] INVASIVE CV LAB;  Service: Cardiovascular;  Laterality: N/A;   ROTATOR CUFF REPAIR     right shoulder   TEE WITHOUT CARDIOVERSION N/A 07/10/2020   Procedure: TRANSESOPHAGEAL ECHOCARDIOGRAM (TEE);  Surgeon: Loreli Slot, MD;  Location: [facility] OR;  Service: Open Heart Surgery;  Laterality: N/A;   TONSILLECTOMY     removed as a tennager   UPPER GASTROINTESTINAL ENDOSCOPY     WISDOM TOOTH EXTRACTION      Current Medications: Current Meds  Medication Sig   Ascorbic Acid (VITAMIN C) 1000 MG tablet Take 1,000 mg by mouth daily.    Calcium Carbonate-Vitamin D (CALTRATE 600+D PO) Take 600 mg by mouth daily.   Cholecalciferol (VITAMIN D3) 50 MCG (2000 UT) TABS Take 2,000 Units by mouth daily.   clotrimazole-betamethasone (LOTRISONE) cream Apply 1 application. topically 2 (two) times daily as needed (irritation).   Coenzyme Q10 100 MG capsule Take 100 mg by mouth daily.   diltiazem (CARDIZEM SR) 120 MG 12 hr capsule Take 1 capsule (120 mg total) by mouth daily.   famotidine (PEPCID) 40 MG tablet Take 1 tablet (40 mg total) by mouth daily.   Multiple Vitamins-Minerals (ALIVE ONCE DAILY WOMENS 50+) TABS Take 1 tablet by mouth daily.   Polyethylene Glycol 400 (BLINK TEARS) 0.25 % SOLN Place 1 drop into both eyes daily as needed (Dry eyes).   Probiotic Product (PROBIOTIC DAILY PO) Take by mouth. Taking over the counter Floragen   rosuvastatin (CRESTOR) 20 MG tablet Take 1 tablet (20 mg total) by mouth daily.   Zinc 50 MG CAPS Take 50 mg by mouth daily.    [DISCONTINUED] apixaban (ELIQUIS) 5 MG TABS tablet TAKE 1 TABLET(5 MG) BY MOUTH TWICE DAILY   [DISCONTINUED] aspirin EC 81 MG tablet Take 81 mg by mouth daily. Swallow whole.   [DISCONTINUED] diltiazem (CARDIZEM CD) 180 MG 24 hr capsule Take 1 capsule (180 mg total) by mouth daily.   [DISCONTINUED] rosuvastatin (CRESTOR) 10 MG tablet Take 1 tablet (10 mg total) by mouth daily.   [DISCONTINUED] valsartan-hydrochlorothiazide (DIOVAN HCT) 160-12.5 MG tablet Take 1 tablet by mouth daily.     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 Helps take care of her mother.    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   Social Determinants of Health   Financial Resource Strain: Not on file  Food Insecurity: Not on file  Transportation Needs: Not on file  Physical Activity: Not on file  Stress: Not on file  Social Connections: Not on file     Family History: The patient's family history includes Heart attack in her father; Stroke in her mother. There is no history of Colon cancer, Colon polyps, Esophageal cancer, Rectal cancer, or Stomach cancer.  ROS:   Please see the history of present illness.    All other systems reviewed and are negative.  EKGs/Labs/Other Studies Reviewed:    The following studies were reviewed today: Cardiac Studies & Procedures   CARDIAC CATHETERIZATION  CARDIAC CATHETERIZATION 07/01/2020  Narrative Conclusions: 1. Severe three-vessel coronary artery disease, including 80-90% proximal/mid LAD stenosis, 80% proximal D1 lesion, 95% proximal ramus stenosis, 70% large OM1 stenosis, and 95% disease involving small, codominant RCA. 2. Normal left ventricular contraction and filling pressure.  Recommendations: 1. Outpatient cardiac surgery consultation for CABG.  Add isosorbide mononitrate 15 mg daily for antianginal therapy. 2. Aggressive secondary prevention of coronary artery disease; consider escalation of statin therapy as tolerated. 3. If no evidence of bleeding or vascular injury at catheterization site, apixaban 5 mg twice daily should be restarted tomorrow morning.  Add aspirin 81 mg daily pending surgical consultation.  Yvonne Kendall, MD [facility]  Findings Coronary Findings Diagnostic  Dominance: Co-dominant  Left Main Vessel is large. Vessel is angiographically normal.  Left Anterior Descending Prox LAD to Mid LAD lesion is 85%  stenosed.  First Diagonal Branch Vessel is large in size. 1st Diag lesion is 80% stenosed.  Ramus Intermedius Vessel is moderate in size. Ramus lesion is 95% stenosed.  Left Circumflex Vessel is large. Mid Cx lesion is 40% stenosed.  First Obtuse Marginal Branch Vessel is large in size. 1st Mrg lesion is 70% stenosed.  Second Obtuse Marginal Branch Vessel is moderate in size.  First Left Posterolateral Branch Vessel is small in size.  Second Left Posterolateral Branch Vessel is small in size.  Right Coronary Artery Vessel is small. Prox RCA lesion is 95% stenosed.  Right Posterior Descending Artery Vessel is small in size.  Intervention  No interventions have been documented.     ECHOCARDIOGRAM  ECHOCARDIOGRAM COMPLETE 07/07/2019  Narrative ECHOCARDIOGRAM REPORT    Patient Name:   Vanessa [name]  Date of Exam: 07/07/2019 Medical Rec #:  [id number]     Height:       68.0 in Accession #:    [id number]    Weight:       194.8 lb Date of Birth:  12-23-1950    BSA:          2.021 m Patient Age:    68 years      BP:           140/60 mmHg Patient Gender: F             HR:           64 bpm. Exam Location:  [address]  Procedure: 3D Echo, 2D Echo, Cardiac Doppler, Color Doppler and Strain Analysis  Indications:    R06.00 Dyspnea on exertion  History:        Patient has prior history of Echocardiogram examinations, most recent 02/19/2016. Arrythmias:SVT and Atrial Fibrillation; Risk Factors:Hypertension, Dyslipidemia and Family History of Coronary Artery Disease.  Sonographer:    Garald Braver, RDCS Referring Phys: [id number] Manson Passey  IMPRESSIONS   1. Left ventricular ejection fraction, by estimation, is 60 to 65%. The left ventricle has normal function. The left ventricle has no regional wall motion abnormalities. Left ventricular diastolic parameters are indeterminate. Elevated left ventricular end-diastolic pressure. The average left ventricular  global longitudinal strain is -22.3 %. 2. Right ventricular systolic function is normal. The right ventricular size is normal. 3. The mitral valve is normal in structure. Trivial mitral valve regurgitation. No evidence of mitral stenosis. 4. The  aortic valve is tricuspid. Aortic valve regurgitation is not visualized. Moderate aortic valve sclerosis/calcification is present, without any evidence of aortic stenosis. 5. The inferior vena cava is normal in size with greater than 50% respiratory variability, suggesting right atrial pressure of 3 mmHg.  Comparison(s): 02/19/16 EF 55-60%.  FINDINGS Left Ventricle: Left ventricular ejection fraction, by estimation, is 60 to 65%. The left ventricle has normal function. The left ventricle has no regional wall motion abnormalities. The average left ventricular global longitudinal strain is -22.3 %. The left ventricular internal cavity size was normal in size. There is no left ventricular hypertrophy. Left ventricular diastolic parameters are indeterminate. Elevated left ventricular end-diastolic pressure.  Right Ventricle: The right ventricular size is normal. No increase in right ventricular wall thickness. Right ventricular systolic function is normal.  Left Atrium: Left atrial size was normal in size.  Right Atrium: Right atrial size was normal in size.  Pericardium: There is no evidence of pericardial effusion.  Mitral Valve: The mitral valve is normal in structure. Normal mobility of the mitral valve leaflets. Mild mitral annular calcification. Trivial mitral valve regurgitation. No evidence of mitral valve stenosis.  Tricuspid Valve: The tricuspid valve is normal in structure. Tricuspid valve regurgitation is not demonstrated. No evidence of tricuspid stenosis.  Aortic Valve: The aortic valve is tricuspid. Aortic valve regurgitation is not visualized. Moderate aortic valve sclerosis/calcification is present, without any evidence of aortic  stenosis.  Pulmonic Valve: The pulmonic valve was normal in structure. Pulmonic valve regurgitation is not visualized. No evidence of pulmonic stenosis.  Aorta: The aortic root is normal in size and structure.  Venous: The inferior vena cava is normal in size with greater than 50% respiratory variability, suggesting right atrial pressure of 3 mmHg.  IAS/Shunts: No atrial level shunt detected by color flow Doppler.   LEFT VENTRICLE PLAX 2D LVIDd:         5.00 cm  Diastology LVIDs:         3.20 cm  LV e' lateral:   6.96 cm/s LV PW:         1.00 cm  LV E/e' lateral: 15.2 LV IVS:        0.90 cm  LV e' medial:    8.38 cm/s LVOT diam:     2.00 cm  LV E/e' medial:  12.6 LV SV:         88 LV SV Index:   44       2D Longitudinal Strain LVOT Area:     3.14 cm 2D Strain GLS (A2C):   -22.8 % 2D Strain GLS (A3C):   -21.5 % 2D Strain GLS (A4C):   -22.7 % 2D Strain GLS Avg:     -22.3 %  3D Volume EF: 3D EF:        61 % LV EDV:       117 ml LV ESV:       46 ml LV SV:        71 ml  RIGHT VENTRICLE RV Basal diam:  2.90 cm RV S prime:     10.00 cm/s TAPSE (M-mode): 2.5 cm  LEFT ATRIUM             Index       RIGHT ATRIUM           Index LA diam:        3.90 cm 1.93 cm/m  RA Pressure: 3.00 mmHg LA Vol (A2C):   73.4 ml 36.32 ml/m RA Area:  10.40 cm LA Vol (A4C):   54.9 ml 27.16 ml/m RA Volume:   19.00 ml  9.40 ml/m LA Biplane Vol: 64.0 ml 31.67 ml/m AORTIC VALVE LVOT Vmax:   126.00 cm/s LVOT Vmean:  83.500 cm/s LVOT VTI:    0.281 m  AORTA Ao Root diam: 3.10 cm Ao Asc diam:  3.30 cm  MITRAL VALVE                TRICUSPID VALVE Estimated RAP:  3.00 mmHg  MV E velocity: 106.00 cm/s  SHUNTS MV A velocity: 77.10 cm/s   Systemic VTI:  0.28 m MV E/A ratio:  1.37         Systemic Diam: 2.00 cm  Armanda Magic MD Electronically signed by Armanda Magic MD Signature Date/Time: 07/07/2019/8:15:20 AM    Final   TEE  ECHO INTRAOPERATIVE TEE  07/10/2020  Narrative *INTRAOPERATIVE TRANSESOPHAGEAL REPORT *    Patient Name:   Vanessa [name]   Date of Exam: 07/10/2020 Medical Rec #:  [id number]      Height:       68.0 in Accession #:    [id number]     Weight:       181.0 lb Date of Birth:  07/28/1950     BSA:          1.96 m Patient Age:    69 years       BP:           144/48 mmHg Patient Gender: F              HR:           51 bpm. Exam Location:  Anesthesiology  Transesophogeal exam was perform intraoperatively during surgical procedure. Patient was closely monitored under general anesthesia during the entirety of examination.  Indications:     CAD Native Vessel Sonographer:     Thurman Coyer RDCS (AE) Performing Phys: [id number] HENDRICKSON Diagnosing Phys: Lewie Loron MD  Complications: No known complications during this procedure. POST-OP IMPRESSIONS - Left Ventricle: The left ventricle is unchanged from pre-bypass. - Right Ventricle: The right ventricle appears unchanged from pre-bypass. - Aorta: The aorta appears unchanged from pre-bypass. - Left Atrium: The left atrium appears unchanged from pre-bypass. - Left Atrial Appendage: Has been surgically closed. - Aortic Valve: The aortic valve appears unchanged from pre-bypass. - Mitral Valve: The mitral valve appears unchanged from pre-bypass. - Tricuspid Valve: The tricuspid valve appears unchanged from pre-bypass. - Pulmonic Valve: The pulmonic valve appears unchanged from pre-bypass. - Interatrial Septum: The interatrial septum appears unchanged from pre-bypass. - Interventricular Septum: The interventricular septum appears unchanged from pre-bypass. - Pericardium: The pericardium appears unchanged from pre-bypass.  PRE-OP FINDINGS Left Ventricle: The left ventricle has normal systolic function, with an ejection fraction of 55-60%. The cavity size was normal. There is concentric left ventricular hypertrophy. There is mild concentric left ventricular  hypertrophy.   Right Ventricle: The right ventricle has normal systolic function. The cavity was normal. There is no increase in right ventricular wall thickness.  Left Atrium: Left atrial size was dilated. No left atrial/left atrial appendage thrombus was detected.  Right Atrium: Right atrial size was normal in size.  Interatrial Septum: No atrial level shunt detected by color flow Doppler. There is no evidence of a patent foramen ovale.  Pericardium: Trivial pericardial effusion is present.  Mitral Valve: The mitral valve is normal in structure. Mitral valve regurgitation is mild by color flow Doppler. There is no evidence  of mitral valve vegetation.  Tricuspid Valve: The tricuspid valve was normal in structure. Tricuspid valve regurgitation is mild by color flow Doppler. There is no evidence of tricuspid valve vegetation.  Aortic Valve: The aortic valve is tricuspid Aortic valve regurgitation was not visualized by color flow Doppler. There is no stenosis of the aortic valve. There is no evidence of aortic valve vegetation.   Pulmonic Valve: The pulmonic valve was normal in structure. Pulmonic valve regurgitation is trivial by color flow Doppler.   Aorta: There is evidence of plaque in the descending aorta; Grade III, measuring 3-35mm in size.  Shunts: There is no evidence of an atrial septal defect.  +-------------+------------++ AORTIC VALVE              +-------------+------------++ AV Vmax:     150.50 cm/s  +-------------+------------++ AV Vmean:    102.300 cm/s +-------------+------------++ AV VTI:      0.441 m      +-------------+------------++ AV Peak Grad:9.1 mmHg     +-------------+------------++ AV Mean Grad:5.0 mmHg     +-------------+------------++   Lewie Loron MD Electronically signed by Lewie Loron MD Signature Date/Time: 07/10/2020/3:53:43 PM    Final   MONITORS  LONG TERM MONITOR (3-14 DAYS)  09/18/2020  Narrative Patch Wear Time:  13 days and 22 hours (2022-05-17T07:44:31-0400 to 2022-05-31T05:59:16-0400)  Max 214 bpm 10:41am, 05/28 Min 48 bpm 06:17am, 05/24 Avg 68 bpm Less than 1% supraventricular ectopy 7.1% ventricular ectopy Predominant underlying rhythm was sinus rhythm 40 runs of SVT, longest 11.7 seconds 1 run of ventricular tachycardia, 9 beats Triggered events and symptoms associated with sinus rhythm  Will Camnitz, MD   CT SCANS  CT CORONARY MORPH W/CTA COR W/SCORE 06/23/2020  Addendum 06/23/2020 10:52 PM ADDENDUM REPORT: 06/23/2020 22:50  EXAM: Cardiac/Coronary  CT  TECHNIQUE: The patient was scanned on a [employer].  FINDINGS: A 120 kV prospective scan was triggered in the descending thoracic aorta at 111 HU's. Axial non-contrast 3 mm slices were carried out through the heart. The data set was analyzed on a dedicated work station and scored using the Agatson method. Gantry rotation speed was 250 msecs and collimation was .6 mm. No beta blockade and 0.8 mg of sl NTG was given. The 3D data set was reconstructed in 5% intervals of the 67-82 % of the R-R cycle. Diastolic phases were analyzed on a dedicated work station using MPR, MIP and VRT modes. The patient received 80 cc of contrast.  Aorta:  Normal size.  Scattered calcifications.  No dissection.  Aortic Valve:  Trileaflet.  No calcifications.  Coronary Arteries:  Normal coronary origin.  Right dominance.  RCA is a large dominant artery that gives rise to PDA and PLVB. 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 There is motion artifact in the RCA which limits accurate assessment. There is at least mild calcified plaque in the proximal RCA with associated stenosis of 25-49%. Distal to this lesion there is an ill defined area that may represent non calcified plaque but cannot accurately quantitate stenosis.  Left main is a large artery that gives  rise to LAD, Ramus and LCX arteries. There is minimal calcified plaque in the ostial LM with associated stenosis of 0-24%.  LAD is a large vessel that gives rise to a large D1. The LAD is diffusely diseased throughout the proximal and mid portions. There is mild calcified plaque in the proximal LAD with associated stenosis of 25-49%. The calcified plaque  extends into the proximal D1 with associated stenosis of 25-49%. There is severe non calcified plaque in the proximal portion of D1 with associated stenosis of 70-99%. There is moderate calcified plaque in the mid LAD with associated stenosis of 50-69%.  The Ramus is a moderate sized vessel. There is a non calcified plaque in the proximal ramus that may be severe with associated stenosis of 70-99% but this is not well visualized. There is mild calcified plaque in the mid to distal ramus with associated stenosis of 25-49%.  LCX is a non-dominant artery that gives rise to one large OM1 branch. There is mild calcified plaque in the proximal OM1 and mild non calcified plaque with associated stenosis of 25-49%. There is mixed plaque in the mid OM1 that is difficult to define but may be as much as 50-69% stenosis or more. There is moderate non calcified plaque in the mid LCx after the takeoff of OM1 with associated stenosis of 50-69%.  Other findings:  Normal pulmonary vein drainage into the left atrium.  Normal let atrial appendage without a thrombus.  Normal size of the pulmonary artery.  IMPRESSION: 1. Coronary calcium score of 339. This was 87th percentile for age and sex matched control.  2.  Normal coronary origin with right dominance.  3.  Moderate atherosclerosis.  CAD RADS 3.  4. Consider symptom-guided anti-ischemic and preventive pharmacotherapy as well as risk factor modification per guideline-directed care.  5.  Consider cardiac catheterization.  6.  This study has been submitted for FFR analysis.  Armanda Magic   Electronically Signed By: Armanda Magic On: 06/23/2020 22:50  Narrative EXAM: OVER-READ INTERPRETATION  CT CHEST  The following report is an over-read performed by radiologist Dr. Trudie Reed of [facility] on 06/21/2020. This over-read does not include interpretation of cardiac or coronary anatomy or pathology. The coronary calcium score/coronary CTA interpretation by the cardiologist is attached.  COMPARISON:  None.  FINDINGS: Atherosclerotic calcifications in the thoracic aorta. Within the visualized portions of the thorax there are no suspicious appearing pulmonary nodules or masses, there is no acute consolidative airspace disease, no pleural effusions, no pneumothorax and no lymphadenopathy. Visualized portions of the upper abdomen demonstrate an incompletely imaged low-attenuation lesion in the left lobe of the liver, not characterized on today's examination, but statistically likely to represent a cyst. There are no aggressive appearing lytic or blastic lesions noted in the visualized portions of the skeleton.  IMPRESSION: 1.  Aortic Atherosclerosis (ICD10-I70.0).  Electronically Signed: By: Trudie Reed M.D. On: 06/21/2020 09:12   CT SCANS  CT CARDIAC SCORING (SELF PAY ONLY) 10/13/2017  Addendum 10/13/2017  5:22 PM ADDENDUM REPORT: 10/13/2017 17:19  EXAM: OVER-READ INTERPRETATION  CT CHEST  The following report is an over-read performed by radiologist Dr. Noe Gens [facility] on 10/13/2017. This over-read does not include interpretation of cardiac or coronary anatomy or pathology. The coronary calcium score interpretation by the cardiologist is attached.  COMPARISON:  None.  FINDINGS: Scattered aortic calcifications. Aorta is normal caliber. Heart is normal size. No adenopathy in the lower mediastinum or hila. Visualized lungs clear. No effusions.  Imaging into the upper abdomen shows no acute findings.  Chest wall soft tissues are unremarkable. No acute bony abnormality.  IMPRESSION: No acute or significant extracardiac abnormality.   Electronically Signed By: Charlett Nose M.D. On: 10/13/2017 17:19  Narrative CLINICAL DATA:  Risk stratification  EXAM: Coronary Calcium Score  TECHNIQUE: The patient was scanned on a Siem[employer]  Force scanner. Axial non-contrast 3 mm slices were carried out through the heart. The data set was analyzed on a dedicated work station and scored using the Agatson method.  MEDICATIONS: None.  FINDINGS: Non-cardiac: See separate report from [facility].  Ascending Aorta: Normal size, trivial calcifications in the ascending and moderate calcifications in the descending aorta.  Pericardium: Normal.  Coronary arteries: Normal origin.  IMPRESSION: Coronary calcium score of 192. This was 9 percentile for age and sex matched control.  Electronically Signed: By: Tobias Alexander On: 10/13/2017 16:02           EKG:  EKG is ordered today.  The ekg ordered today demonstrates marked sinus bradycardia 49 bpm, nonspecific T wave abnormality.  Recent Labs: 08/14/2021: ALT 15 08/20/2021: BUN 22; Creatinine, Ser 1.29; Hemoglobin 9.4; Platelets 140; Potassium 4.8; Sodium 138  Recent Lipid Panel    Component Value Date/Time   CHOL 140 07/21/2021 1013   TRIG 171 (H) 07/21/2021 1013   HDL 42 07/21/2021 1013   CHOLHDL 3.3 07/21/2021 1013   LDLCALC 69 07/21/2021 1013     Risk Assessment/Calculations:    CHA2DS2-VASc Score = 4   This indicates a 4.8% annual risk of stroke. The patient's score is based upon: CHF History: 0 HTN History: 1 Diabetes History: 0 Stroke History: 0 Vascular Disease History: 1 Age Score: 1 Gender Score: 1               Physical Exam:    VS:  BP 134/70   Pulse (!) 49   Ht 5\' 8"  (1.727 m)   Wt 181 lb 12.8 oz (82.5 kg)   SpO2 97%   BMI 27.64 kg/m     Wt Readings from Last 3 Encounters:  08/10/22 181  lb 12.8 oz (82.5 kg)  07/09/22 182 lb (82.6 kg)  02/03/22 180 lb (81.6 kg)     GEN:  Well nourished, well developed in no acute distress HEENT: Normal NECK: No JVD; No carotid bruits LYMPHATICS: No lymphadenopathy CARDIAC: RRR, no murmurs, rubs, gallops RESPIRATORY:  Clear to auscultation without rales, wheezing or rhonchi  ABDOMEN: Soft, non-tender, non-distended MUSCULOSKELETAL:  No edema; No deformity  SKIN: Warm and dry NEUROLOGIC:  Alert and oriented x 3 PSYCHIATRIC:  Normal affect   ASSESSMENT:    1. Bilateral carotid artery stenosis   2. Paroxysmal atrial fibrillation (HCC)   3. Coronary artery disease involving native coronary artery of native heart without angina pectoris   4. Essential hypertension   5. Mixed hyperlipidemia    PLAN:    In order of problems listed above:  Stable after left carotid endarterectomy.  Carotid duplex surveillance per vascular surgery.  Patient on appropriate medical therapy.  Will discontinue aspirin since she is on long-term oral anticoagulation with apixaban. Maintaining sinus rhythm.  With sinus bradycardia, will reduce diltiazem to 120 mg daily.  If she continues to have issues with bradycardia and/or fatigue, could consider change from diltiazem to amlodipine in the future. Stable without symptoms of angina on calcium channel blocker.  Stop aspirin as above.  Will intensify her statin therapy see below. Blood pressure controlled with current management on valsartan, hydrochlorothiazide, and diltiazem. LDL cholesterol just above 70.  With her extensive vascular disease and recent CABG and left carotid endarterectomy in the past few years, seems appropriate to intensify her lipid-lowering and aim for a goal LDL less than 55 mg/dL.  Will increase rosuvastatin to 20 mg daily, follow-up lipids and LFTs in about 6 months,  and consider addition of Zetia if she remains above goal.           Medication Adjustments/Labs and Tests  Ordered: Current medicines are reviewed at length with the patient today.  Concerns regarding medicines are outlined above.  Orders Placed This Encounter  Procedures   Hepatic function panel   Lipid panel   EKG 12-Lead   Meds ordered this encounter  Medications   valsartan-hydrochlorothiazide (DIOVAN HCT) 160-12.5 MG tablet    Sig: Take 1 tablet by mouth daily.    Dispense:  90 tablet    Refill:  3   diltiazem (CARDIZEM SR) 120 MG 12 hr capsule    Sig: Take 1 capsule (120 mg total) by mouth daily.    Dispense:  90 capsule    Refill:  3    Dose DECREASE   rosuvastatin (CRESTOR) 20 MG tablet    Sig: Take 1 tablet (20 mg total) by mouth daily.    Dispense:  90 tablet    Refill:  3   apixaban (ELIQUIS) 5 MG TABS tablet    Sig: TAKE 1 TABLET(5 MG) BY MOUTH TWICE DAILY    Dispense:  180 tablet    Refill:  3    Patient Instructions  Medication Instructions:  START Diltiazem CD 120mg  daily START Crestor/Rosuvastatin 20mg  daily STOP Aspirin *If you need a refill on your cardiac medications before your next appointment, please call your pharmacy*   Lab Work: Lipids, liver in 6 months (prior to appt) If you have labs (blood work) drawn today and your tests are completely normal, you will receive your results only by: MyChart Message (if you have MyChart) OR A paper copy in the mail If you have any lab test that is abnormal or we need to change your treatment, we will call you to review the results.   Testing/Procedures: NONE   Follow-Up: At [facility], you and your health needs are our priority.  As part of our continuing mission to provide you with exceptional heart care, we have created designated Provider Care Teams.  These Care Teams include your primary Cardiologist (physician) and Advanced Practice Providers (APPs -  Physician Assistants and Nurse Practitioners) who all work together to provide you with the care you need, when you need it.  We recommend  signing up for the patient portal called "MyChart".  Sign up information is provided on this After Visit Summary.  MyChart is used to connect with patients for Virtual Visits (Telemedicine).  Patients are able to view lab/test results, encounter notes, upcoming appointments, etc.  Non-urgent messages can be sent to your provider as well.   To learn more about what you can do with MyChart, go to [email or web address].    Your next appointment:   6 month(s)  Provider:   Jari Favre, PA-C, Robin Searing, NP, Eligha Bridegroom, NP, or Tereso Newcomer, PA-C     Then, Tonny Bollman, MD will plan to see you again in 1 year(s).       Signed, Tonny Bollman, MD  08/10/2022 12:58 PM    [facility]

## 2022-08-10 NOTE — Patient Instructions (Addendum)
Medication Instructions:  START Diltiazem CD 120mg  daily START Crestor/Rosuvastatin 20mg  daily STOP Aspirin *If you need a refill on your cardiac medications before your next appointment, please call your pharmacy*   Lab Work: Lipids, liver in 6 months (prior to appt) If you have labs (blood work) drawn today and your tests are completely normal, you will receive your results only by: MyChart Message (if you have MyChart) OR A paper copy in the mail If you have any lab test that is abnormal or we need to change your treatment, we will call you to review the results.   Testing/Procedures: NONE   Follow-Up: At [facility], you and your health needs are our priority.  As part of our continuing mission to provide you with exceptional heart care, we have created designated Provider Care Teams.  These Care Teams include your primary Cardiologist (physician) and Advanced Practice Providers (APPs -  Physician Assistants and Nurse Practitioners) who all work together to provide you with the care you need, when you need it.  We recommend signing up for the patient portal called "MyChart".  Sign up information is provided on this After Visit Summary.  MyChart is used to connect with patients for Virtual Visits (Telemedicine).  Patients are able to view lab/test results, encounter notes, upcoming appointments, etc.  Non-urgent messages can be sent to your provider as well.   To learn more about what you can do with MyChart, go to [email or web address].    Your next appointment:   6 month(s)  Provider:   Jari Favre, PA-C, Robin Searing, NP, Eligha Bridegroom, NP, or Tereso Newcomer, PA-C     Then, Tonny Bollman, MD will plan to see you again in 1 year(s).

## 2022-09-01 ENCOUNTER — Other Ambulatory Visit: Payer: Self-pay | Admitting: Nurse Practitioner

## 2022-12-24 ENCOUNTER — Telehealth: Payer: Self-pay | Admitting: Cardiovascular Disease

## 2022-12-24 NOTE — Telephone Encounter (Signed)
Pt reports being in Afib since Tuesday. HRs jumping from 70-80s up to 140-150s. She is not SOB, but is "getting exhausted from this tachycardia/afib". She denies CP, dizziness/light headedness. BP at home avg 120-140s/70s. She recently underwent 2 surgeries. Recent ERCP on 9/4.  Prednisone was prescribed afterwards and she took her last does yesterday. She understands the surgeries and/or Prednisone could have precipitated this afib. She is asking if she should increase her Diltiazem back to 180 (states she still has some on hand at home). Aware will forward to MD for review/advisement. Aware the med change, if advised to do, may be temporary.  Pt agreeable.

## 2022-12-24 NOTE — Telephone Encounter (Signed)
Patient c/o Palpitations:  STAT if patient reporting lightheadedness, shortness of breath, or chest pain  How long have you had palpitations/irregular HR/ Afib? Are you having the symptoms now? She has been in Afib and Tachycardia since little something in her throat feels like shortness of breath  Are you currently experiencing lightheadedness, SOB or CP? something in her throat feels like shortness of breath   Do you have a history of afib (atrial fibrillation) or irregular heart rhythm?   Have you checked your BP or HR? (document readings if available): ok  Are you experiencing any other symptoms? Heart Rate goes up to 140 and 150, goes down to 80

## 2022-12-24 NOTE — Telephone Encounter (Signed)
Discussed w/ DOD, Dr. Anne Fu. Pt advised to increase Diltiazem to 180 mg for the next 2-3 weeks, then return to current dosing of 120 mg daily. If does not work, or has issues again after reducing dose in several weeks - advised to call office back to discuss. Patient verbalized understanding and agreeable to plan.

## 2023-02-03 LAB — HEPATIC FUNCTION PANEL
ALT: 17 [IU]/L (ref 0–32)
AST: 23 [IU]/L (ref 0–40)
Albumin: 4.2 g/dL (ref 3.8–4.8)
Alkaline Phosphatase: 49 [IU]/L (ref 44–121)
Bilirubin Total: 0.7 mg/dL (ref 0.0–1.2)
Bilirubin, Direct: 0.22 mg/dL (ref 0.00–0.40)
Total Protein: 6.2 g/dL (ref 6.0–8.5)

## 2023-02-03 LAB — LIPID PANEL
Chol/HDL Ratio: 2.4 {ratio} (ref 0.0–4.4)
Cholesterol, Total: 136 mg/dL (ref 100–199)
HDL: 57 mg/dL (ref >39)
LDL Chol Calc (NIH): 62 mg/dL (ref 0–99)
Triglycerides: 89 mg/dL (ref 0–149)
VLDL Cholesterol Cal: 17 mg/dL (ref 5–40)

## 2023-02-08 NOTE — Progress Notes (Unsigned)
Cardiology Office Note:    Date:  02/09/2023  ID:  Scarlette Slice, DOB 03/19/1951, MRN [id number] PCP: Hadley Pen, MD  [facility] Providers Cardiologist:  Tonny Bollman, MD Electrophysiologist:  Regan Lemming, MD       Patient Profile:      Paroxysmal atrial fibrillation  TTE 07/07/2019: EF 60-65, no RWMA, GLS -22.3, normal RVSF, trivial MR, AV sclerosis, RAP 3 IntraOp TEE 07/10/2020: EF 55-60 Supraventricular Tachycardia  Monitor 09/17/2020: 40 runs SVT, 1 run NSVT Coronary artery disease  LHC 07/01/2020: Proximal LAD 80-90, proximal D1 80, proximal RI 90 OM1 70, RCA 95 S/p CABG, LAA clipping in 06/2020 Carotid artery disease S/p L CEA in 08/2021 Carotid US 07/09/2022: R ICA 1-39, L CEA patent Bradycardia  Hypertension  Hyperlipidemia          History of Present Illness:  Discussed the use of AI scribe software for clinical note transcription with the patient, who gave verbal consent to proceed.  Vanessa [name] is a 72 y.o. female who returns for follow-up of CAD, A-fib.  She was last seen by Dr. Excell Seltzer April 2024.  Diltiazem dose was reduced at that visit secondary to bradycardia. She is here alone. Her atrial fibrillation was recently exacerbated by a course of prednisone. She called our office and was instructed to take Diltiazem 180 mg daily for 1-2 weeks. The patient reports that her heart rate has since returned to normal, and she has resumed her usual diltiazem dosage. The patient also reports occasional left lower chest discomfort, which does not sound cardiac in nature. The discomfort is not associated with exertion, shortness of breath, or fatigue. She has not had syncope, orthopnea, leg edema. She was admitted to [facility] in 10/2022 with gallstone pancreatitis. she had retained stones in her bile duct. She underwent ERCP and stent placement.      Review of Systems  Gastrointestinal:  Negative for hematochezia and melena.  Genitourinary:  Negative for  hematuria.  See HPI     Studies Reviewed:   EKG Interpretation Date/Time:  Tuesday February 09 2023 11:41:02 EDT Ventricular Rate:  63 PR Interval:  86 QRS Duration:  108 QT Interval:  424 QTC Calculation: 433 R Axis:   -37  Text Interpretation: Sinus rhythm with short PR Left axis deviation Incomplete right bundle branch block Non-specific ST-t changes No significant change since last tracing Confirmed by Tereso Newcomer [phone]) on 02/09/2023 11:49:24 AM   Results   LABS Total cholesterol: 136 mg/dL (19/14/7829) HDL: 57 mg/dL (56/21/3086) LDL: 62 mg/dL (57/84/6962) Triglycerides: 89 mg/dL (95/28/4132) ALT: 17 U/L (02/02/2023) Potassium: 4.9 mmol/L (12/03/2022) Creatinine: 0.88 mg/dL (44/04/270) Hemoglobin: 11.7 g/dL (53/66/4403)      Risk Assessment/Calculations:    CHA2DS2-VASc Score = 4   This indicates a 4.8% annual risk of stroke. The patient's score is based upon: CHF History: 0 HTN History: 1 Diabetes History: 0 Stroke History: 0 Vascular Disease History: 1 Age Score: 1 Gender Score: 1    HYPERTENSION CONTROL Vitals:   02/09/23 1134 02/09/23 1223  BP: 138/60 (!) 156/64    The patient's blood pressure is elevated above target today.  In order to address the patient's elevated BP: Blood pressure will be monitored at home to determine if medication changes need to be made.          Physical Exam:   VS:  BP (!) 156/64   Pulse 63   Ht 5\' 8"  (1.727 m)   Wt 182 lb  3.2 oz (82.6 kg)   SpO2 93%   BMI 27.70 kg/m    Wt Readings from Last 3 Encounters:  02/09/23 182 lb 3.2 oz (82.6 kg)  08/10/22 181 lb 12.8 oz (82.5 kg)  07/09/22 182 lb (82.6 kg)    Constitutional:      Appearance: Healthy appearance. Not in distress.  Neck:     Vascular: No JVR. JVD normal.  Pulmonary:     Breath sounds: Normal breath sounds. No wheezing. No rales.  Cardiovascular:     Normal rate. Regular rhythm.     Murmurs: There is no murmur.  Edema:    Peripheral edema absent.   Abdominal:     Palpations: Abdomen is soft.      Assessment and Plan:   Assessment & Plan Coronary artery disease involving native coronary artery of native heart without angina pectoris Status post CABG in March 2022. She has occasional non-cardiac left lower chest discomfort. She has not associated symptoms. -Continue Crestor 20mg  daily. -Consider stress testing if more frequent chest discomfort or associated symptoms occur (risks and benefits of Lexiscan Myoview discussed today). Bilateral carotid artery stenosis Status post left CEA in May 2023. Patent left CEA on carotid ultrasound in March 2024. -Continue Crestor 20mg  daily. -F/u with Vascular Surgery as planned Paroxysmal atrial fibrillation (HCC) Recent episode of atrial fibrillation after being placed on prednisone by orthopedics. She is currently in sinus rhythm. -Continue Diltiazem CD 120mg  daily and Eliquis 5mg  twice daily. -Consider antiarrhythmic therapy or referral to electrophysiology if more frequent episodes of AFib occur. Essential hypertension Blood pressure above target. It is usually better controlled. -Continue Diltiazem 120mg  daily and Diovan/HCTZ 160/12.5mg  daily. -Monitor blood pressure for 1 week and send readings for review. If blood pressure remains above target, consider increasing dose of valsartan or adding hydralazine. Mixed hyperlipidemia Recent LDL above goal at 62. She was off her Rosuvastatin for a while with the pancreatitis and then was on 1/2 dose for a while. -Continue Crestor 20mg  daily. -Repeat lipids and LFTs in 3 months. If LDL remains above 55, consider increasing rosuvastatin to 40mg  daily.       Dispo:  Return in about 6 months (around 08/10/2023) for Routine Follow Up, w/ Dr. Excell Seltzer.  Signed, Tereso Newcomer, PA-C

## 2023-02-09 ENCOUNTER — Ambulatory Visit: Payer: PPO | Attending: Physician Assistant | Admitting: Physician Assistant

## 2023-02-09 ENCOUNTER — Other Ambulatory Visit: Payer: Self-pay | Admitting: *Deleted

## 2023-02-09 ENCOUNTER — Encounter: Payer: Self-pay | Admitting: Physician Assistant

## 2023-02-09 VITALS — BP 156/64 | HR 63 | Ht 68.0 in | Wt 182.2 lb

## 2023-02-09 DIAGNOSIS — E782 Mixed hyperlipidemia: Secondary | ICD-10-CM

## 2023-02-09 DIAGNOSIS — I2510 Atherosclerotic heart disease of native coronary artery without angina pectoris: Secondary | ICD-10-CM

## 2023-02-09 DIAGNOSIS — I6523 Occlusion and stenosis of bilateral carotid arteries: Secondary | ICD-10-CM

## 2023-02-09 DIAGNOSIS — I10 Essential (primary) hypertension: Secondary | ICD-10-CM

## 2023-02-09 DIAGNOSIS — I480 Paroxysmal atrial fibrillation: Secondary | ICD-10-CM

## 2023-02-09 MED ORDER — APIXABAN 5 MG PO TABS: ORAL_TABLET | ORAL | 3 refills | Status: DC

## 2023-02-09 MED ORDER — VALSARTAN-HYDROCHLOROTHIAZIDE 160-12.5 MG PO TABS: 1.0000 | ORAL_TABLET | Freq: Every day | ORAL | 3 refills | Status: DC

## 2023-02-09 MED ORDER — ROSUVASTATIN CALCIUM 20 MG PO TABS: 20.0000 mg | ORAL_TABLET | Freq: Every day | ORAL | 3 refills | Status: DC

## 2023-02-09 MED ORDER — DILTIAZEM HCL ER 120 MG PO CP12: 120.0000 mg | ORAL_CAPSULE | Freq: Every day | ORAL | 3 refills | Status: DC

## 2023-02-09 NOTE — Assessment & Plan Note (Signed)
Status post CABG in March 2022. She has occasional non-cardiac left lower chest discomfort. She has not associated symptoms. -Continue Crestor 20mg  daily. -Consider stress testing if more frequent chest discomfort or associated symptoms occur (risks and benefits of Lexiscan Myoview discussed today).

## 2023-02-09 NOTE — Patient Instructions (Signed)
Medication Instructions:  Your physician recommends that you continue on your current medications as directed. Please refer to the Current Medication list given to you today.  *If you need a refill on your cardiac medications before your next appointment, please call your pharmacy*   Lab Work: 3 MONTHS:  GO TO THE [place] OFFICE FOR FASTING LABS  If you have labs (blood work) drawn today and your tests are completely normal, you will receive your results only by: MyChart Message (if you have MyChart) OR A paper copy in the mail If you have any lab test that is abnormal or we need to change your treatment, we will call you to review the results.   Testing/Procedures: None ordered   Follow-Up: At [facility], you and your health needs are our priority.  As part of our continuing mission to provide you with exceptional heart care, we have created designated Provider Care Teams.  These Care Teams include your primary Cardiologist (physician) and Advanced Practice Providers (APPs -  Physician Assistants and Nurse Practitioners) who all work together to provide you with the care you need, when you need it.  We recommend signing up for the patient portal called "MyChart".  Sign up information is provided on this After Visit Summary.  MyChart is used to connect with patients for Virtual Visits (Telemedicine).  Patients are able to view lab/test results, encounter notes, upcoming appointments, etc.  Non-urgent messages can be sent to your provider as well.   To learn more about what you can do with MyChart, go to [email or web address].    Your next appointment:   6 month(s)  Provider:   Tonny Bollman, MD     Other Instructions  Your physician has requested that you regularly monitor and record your blood pressure readings at home. Please use the same machine at the same time of day to check your readings and record them to bring to your follow-up visit.   Please monitor blood  pressures and keep a log of your readings for 2 weeks then send a message via mychart.    Make sure to check 2 hours after your medications.    AVOID these things for 30 minutes before checking your blood pressure: No Drinking caffeine. No Drinking alcohol. No Eating. No Smoking. No Exercising.   Five minutes before checking your blood pressure: Pee. Sit in a dining chair. Avoid sitting in a soft couch or armchair. Be quiet. Do not talk

## 2023-02-09 NOTE — Assessment & Plan Note (Signed)
Recent LDL above goal at 62. She was off her Rosuvastatin for a while with the pancreatitis and then was on 1/2 dose for a while. -Continue Crestor 20mg  daily. -Repeat lipids and LFTs in 3 months. If LDL remains above 55, consider increasing rosuvastatin to 40mg  daily.

## 2023-02-09 NOTE — Assessment & Plan Note (Signed)
Recent episode of atrial fibrillation after being placed on prednisone by orthopedics. She is currently in sinus rhythm. -Continue Diltiazem CD 120mg  daily and Eliquis 5mg  twice daily. -Consider antiarrhythmic therapy or referral to electrophysiology if more frequent episodes of AFib occur.

## 2023-02-09 NOTE — Assessment & Plan Note (Signed)
Status post left CEA in May 2023. Patent left CEA on carotid ultrasound in March 2024. -Continue Crestor 20mg  daily. -F/u with Vascular Surgery as planned

## 2023-03-02 ENCOUNTER — Other Ambulatory Visit (HOSPITAL_BASED_OUTPATIENT_CLINIC_OR_DEPARTMENT_OTHER): Payer: Self-pay

## 2023-03-31 ENCOUNTER — Encounter: Payer: Self-pay | Admitting: Cardiovascular Disease

## 2023-03-31 DIAGNOSIS — I480 Paroxysmal atrial fibrillation: Secondary | ICD-10-CM

## 2023-04-05 ENCOUNTER — Ambulatory Visit (INDEPENDENT_AMBULATORY_CARE_PROVIDER_SITE_OTHER): Payer: PPO

## 2023-04-05 ENCOUNTER — Ambulatory Visit: Payer: PPO | Attending: Internal Medicine

## 2023-04-05 VITALS — BP 148/62 | HR 60 | Wt 189.0 lb

## 2023-04-05 DIAGNOSIS — I480 Paroxysmal atrial fibrillation: Secondary | ICD-10-CM

## 2023-04-05 NOTE — Patient Instructions (Signed)
Medication Instructions:  Continue current meds *If you need a refill on your cardiac medications before your next appointment, please call your pharmacy*  Testing/Procedures: EKG today (plan for zio monitor)  Follow-Up: At [facility], you and your health needs are our priority.  As part of our continuing mission to provide you with exceptional heart care, we have created designated Provider Care Teams.  These Care Teams include your primary Cardiologist (physician) and Advanced Practice Providers (APPs -  Physician Assistants and Nurse Practitioners) who all work together to provide you with the care you need, when you need it.  Your next appointment:   As scheduled  Provider:   Riley Lam, MD

## 2023-04-05 NOTE — Progress Notes (Unsigned)
   Nurse Visit   Date of Encounter: 04/05/2023 ID: Vanessa [name], DOB [birth year], MRN [id number]  PCP:  Hadley Pen, MD   [facility] Providers Cardiologist:  Tonny Bollman, MD Electrophysiologist:  Regan Lemming, MD { Click to update primary MD,subspecialty MD or APP then REFRESH:1}     Visit Details   VS:  BP (!) 148/62 (BP Location: Right Arm, Patient Position: Sitting, Cuff Size: Normal)   Pulse 60   Wt 189 lb (85.7 kg)   SpO2 98%   BMI 28.74 kg/m  , BMI Body mass index is 28.74 kg/m.  Wt Readings from Last 3 Encounters:  04/05/23 189 lb (85.7 kg)  02/09/23 182 lb 3.2 oz (82.6 kg)  08/10/22 181 lb 12.8 oz (82.5 kg)     Reason for visit: EKG to verify if in a-fib Performed today: Vitals, EKG, Provider consulted:Chandrasekhar, and Education Changes (medications, testing, etc.) : NONE Length of Visit: 20 minutes    Medications Adjustments/Labs and Tests Ordered: Orders Placed This Encounter  Procedures   EKG 12-Lead   No orders of the defined types were placed in this encounter.  Per Izora Ribas, pt is in NSR, continue with Dr Earmon Phoenix plan for 7-day zio. Order sent to [facility] for completion/sign-off. EKG completed today.  Signed, Lars Mage, RN  04/05/2023 2:32 PM

## 2023-04-05 NOTE — Telephone Encounter (Signed)
Nurse visit EKG done 04/05/23 confirms NSR. 7-day zio ordered and placed on pt in clinic.

## 2023-04-05 NOTE — Progress Notes (Unsigned)
ZIO serial # [id number] from office inventory applied to patient.

## 2023-06-01 ENCOUNTER — Other Ambulatory Visit (HOSPITAL_BASED_OUTPATIENT_CLINIC_OR_DEPARTMENT_OTHER): Payer: Self-pay

## 2023-07-12 ENCOUNTER — Other Ambulatory Visit: Payer: Self-pay

## 2023-07-12 DIAGNOSIS — I6523 Occlusion and stenosis of bilateral carotid arteries: Secondary | ICD-10-CM

## 2023-07-19 ENCOUNTER — Encounter: Payer: Self-pay | Admitting: Cardiovascular Disease

## 2023-07-23 ENCOUNTER — Ambulatory Visit (INDEPENDENT_AMBULATORY_CARE_PROVIDER_SITE_OTHER): Payer: PPO | Admitting: Physician Assistant

## 2023-07-23 ENCOUNTER — Ambulatory Visit (HOSPITAL_COMMUNITY)
Admission: RE | Admit: 2023-07-23 | Discharge: 2023-07-23 | Disposition: A | Discharge status: Home / Self Care | Payer: PPO | Source: Ambulatory Visit | Attending: Vascular Surgery | Admitting: Vascular Surgery

## 2023-07-23 VITALS — BP 161/75 | HR 53 | Temp 97.6°F | Resp 18 | Ht 68.0 in | Wt 188.0 lb

## 2023-07-23 DIAGNOSIS — I6523 Occlusion and stenosis of bilateral carotid arteries: Secondary | ICD-10-CM | POA: Diagnosis present

## 2023-07-23 NOTE — Progress Notes (Signed)
 Office Note   History of Present Illness   Vanessa [name] is a 73 y.o. (11/13/1950) female who presents for surveillance of carotid artery stenosis.  She has a history of left carotid endarterectomy by Dr. Edilia Bo on 08/19/2021.  This was done for asymptomatic high-grade left ICA stenosis.  She also has a history of CABG in 2022.  She returns today for follow-up.  She denies any strokelike symptoms such as slurred speech, facial droop, amaurosis fugax, or sudden weakness/numbness.  She does have ongoing, occasional dizziness with exertion and at rest ever since her CABG 2 years ago.  She says that her PCP and cardiologist are unsure of the cause.  Current Outpatient Medications  Medication Sig Dispense Refill   apixaban (ELIQUIS) 5 MG TABS tablet TAKE 1 TABLET(5 MG) BY MOUTH TWICE DAILY 180 tablet 3   clotrimazole-betamethasone (LOTRISONE) cream Apply 1 application. topically 2 (two) times daily as needed (irritation).     Coenzyme Q10 100 MG capsule Take 100 mg by mouth daily.     diltiazem (CARDIZEM SR) 120 MG 12 hr capsule Take 1 capsule (120 mg total) by mouth daily. (Patient taking differently: Take 240 mg by mouth daily.) 90 capsule 3   esomeprazole (NEXIUM) 40 MG capsule Take 40 mg by mouth daily at 12 noon.     hydrochlorothiazide (HYDRODIURIL) 12.5 MG tablet Take 1 tablet by mouth daily.     Multiple Vitamins-Minerals (ALIVE ONCE DAILY WOMENS 50+) TABS Take 1 tablet by mouth daily.     Polyethylene Glycol 400 (BLINK TEARS) 0.25 % SOLN Place 1 drop into both eyes daily as needed (Dry eyes).     Probiotic Product (PROBIOTIC DAILY PO) Take by mouth. Taking over the counter Floragen     rosuvastatin (CRESTOR) 20 MG tablet Take 1 tablet (20 mg total) by mouth daily. 90 tablet 3   valsartan-hydrochlorothiazide (DIOVAN HCT) 160-12.5 MG tablet Take 1 tablet by mouth daily. 90 tablet 3   No current facility-administered medications for this visit.    REVIEW OF SYSTEMS (negative unless  checked):   Cardiac:  []  Chest pain or chest pressure? []  Shortness of breath upon activity? []  Shortness of breath when lying flat? []  Irregular heart rhythm?  Vascular:  []  Pain in calf, thigh, or hip brought on by walking? []  Pain in feet at night that wakes you up from your sleep? []  Blood clot in your veins? []  Leg swelling?  Pulmonary:  []  Oxygen at home? []  Productive cough? []  Wheezing?  Neurologic:  []  Sudden weakness in arms or legs? []  Sudden numbness in arms or legs? []  Sudden onset of difficult speaking or slurred speech? []  Temporary loss of vision in one eye? [x]  Problems with dizziness?  Gastrointestinal:  []  Blood in stool? []  Vomited blood?  Genitourinary:  []  Burning when urinating? []  Blood in urine?  Psychiatric:  []  Major depression  Hematologic:  []  Bleeding problems? []  Problems with blood clotting?  Dermatologic:  []  Rashes or ulcers?  Constitutional:  []  Fever or chills?  Ear/Nose/Throat:  []  Change in hearing? []  Nose bleeds? []  Sore throat?  Musculoskeletal:  []  Back pain? []  Joint pain?   Physical Examination   Vitals:   07/23/23 1230  BP: (!) 161/75  Pulse: (!) 53  Resp: 18  Temp: 97.6 F (36.4 C)  TempSrc: Temporal  SpO2: 97%  Weight: 188 lb (85.3 kg)  Height: 5\' 8"  (1.727 m)   Body mass index is 28.59 kg/m.  General:  WDWN in NAD; vital signs documented above Gait: Not observed HENT: WNL, normocephalic Pulmonary: normal non-labored breathing , without rales, rhonchi,  wheezing Cardiac: irregularly irregular Abdomen: soft, NT, no masses Skin: without rashes Vascular Exam/Pulses: palpable radial pulses bilaterally Extremities: without ischemic changes, without gangrene , without cellulitis; without open wounds;  Musculoskeletal: no muscle wasting or atrophy  Neurologic: A&O X 3;  No focal weakness or paresthesias are detected Psychiatric:  The pt has Normal affect.  Non-Invasive Vascular Imaging    Bilateral Carotid Duplex (07/23/2023):  R ICA stenosis:  1-39% R VA:  patent and antegrade L ICA stenosis:   none L VA:  patent and antegrade   Medical Decision Making   LATRENDA IRANI is a 73 y.o. female who presents for surveillance of carotid artery stenosis  Based on the patient's vascular studies, her carotid artery stenosis is unchanged bilaterally. She has right ICA 1 to 39% stenosis.  She has a patent left carotid endarterectomy site without restenosis She denies any strokelike symptoms such as slurred speech, facial droop, sudden visual changes, or sudden weakness/numbness She has no neurological deficits on exam. She has palpable and equal radial pulses bilaterally She will continue her Eliquis and statin and follow-up with our office in 1 year with repeat carotid duplex   Deneise Finlay PA-C [facility] Office: [phone]  Clinic MD: Susi Eric

## 2023-08-05 ENCOUNTER — Ambulatory Visit: Payer: PPO | Attending: Cardiovascular Disease | Admitting: Cardiovascular Disease

## 2023-08-05 ENCOUNTER — Encounter: Payer: Self-pay | Admitting: Cardiovascular Disease

## 2023-08-05 VITALS — BP 132/68 | HR 60 | Ht 68.0 in | Wt 189.0 lb

## 2023-08-05 DIAGNOSIS — I6523 Occlusion and stenosis of bilateral carotid arteries: Secondary | ICD-10-CM

## 2023-08-05 DIAGNOSIS — I2510 Atherosclerotic heart disease of native coronary artery without angina pectoris: Secondary | ICD-10-CM

## 2023-08-05 DIAGNOSIS — E782 Mixed hyperlipidemia: Secondary | ICD-10-CM

## 2023-08-05 DIAGNOSIS — I480 Paroxysmal atrial fibrillation: Secondary | ICD-10-CM

## 2023-08-05 DIAGNOSIS — I10 Essential (primary) hypertension: Secondary | ICD-10-CM | POA: Diagnosis not present

## 2023-08-05 NOTE — Progress Notes (Signed)
 Cardiology Office Note:    Date:  08/05/2023   ID:  Vanessa [name], DOB May 24, 1950, MRN [id number]  PCP:  Melva Stabile, MD    HeartCare Providers Cardiologist:  Arnoldo Lapping, MD Electrophysiologist:  Will Cortland Ding, MD     Referring MD: Melva Stabile, MD   No chief complaint on file.   History of Present Illness:    Vanessa [name] is a 73 y.o. female with a hx of:  Paroxysmal atrial fibrillation  TTE 07/07/2019: EF 60-65, no RWMA, GLS -22.3, normal RVSF, trivial MR, AV sclerosis, RAP 3 IntraOp TEE 07/10/2020: EF 55-60 Supraventricular Tachycardia  Monitor 09/17/2020: 40 runs SVT, 1 run NSVT Coronary artery disease  LHC 07/01/2020: Proximal LAD 80-90, proximal D1 80, proximal RI 90 OM1 70, RCA 95 S/p CABG, LAA clipping in 06/2020 Carotid artery disease S/p L CEA in 08/2021 Carotid US  07/09/2022: R ICA 1-39, L CEA patent Bradycardia  Hypertension  Hyperlipidemia    The patient is here alone today.  She been doing pretty well from a cardiac perspective.  She denies any recent chest pain or pressure.  No shortness of breath.  She has rare heart palpitations but does not think she has had any atrial fibrillation episodes lately.  She has mild right ankle edema ever since she had vein harvesting for bypass surgery.  No other complaints reported today.  She remains under a lot of stress at home with her husband's health problems.   Current Medications: Current Meds  Medication Sig   apixaban  (ELIQUIS ) 5 MG TABS tablet TAKE 1 TABLET(5 MG) BY MOUTH TWICE DAILY   clotrimazole-betamethasone (LOTRISONE) cream Apply 1 application. topically 2 (two) times daily as needed (irritation).   Coenzyme Q10 100 MG capsule Take 100 mg by mouth daily.   diltiazem  (CARDIZEM  SR) 120 MG 12 hr capsule Take 1 capsule (120 mg total) by mouth daily. (Patient taking differently: Take 240 mg by mouth daily.)   esomeprazole  (NEXIUM ) 40 MG capsule Take 40 mg by mouth daily at 12 noon.    hydrochlorothiazide  (HYDRODIURIL ) 12.5 MG tablet Take 1 tablet by mouth daily.   Multiple Vitamins-Minerals (ALIVE ONCE DAILY WOMENS 50+) TABS Take 1 tablet by mouth daily.   Polyethylene Glycol 400 (BLINK TEARS) 0.25 % SOLN Place 1 drop into both eyes daily as needed (Dry eyes).   Probiotic Product (PROBIOTIC DAILY PO) Take by mouth. Taking over the counter Floragen   rosuvastatin  (CRESTOR ) 20 MG tablet Take 1 tablet (20 mg total) by mouth daily.   valsartan -hydrochlorothiazide  (DIOVAN  HCT) 160-12.5 MG tablet Take 1 tablet by mouth daily.     Allergies:   Codeine   ROS:   Please see the history of present illness.    All other systems reviewed and are negative.  EKGs/Labs/Other Studies Reviewed:    The following studies were reviewed today: Cardiac Studies & Procedures   ______________________________________________________________________________________________ CARDIAC CATHETERIZATION  CARDIAC CATHETERIZATION 07/01/2020  Conclusion Conclusions: 1. Severe three-vessel coronary artery disease, including 80-90% proximal/mid LAD stenosis, 80% proximal D1 lesion, 95% proximal ramus stenosis, 70% large OM1 stenosis, and 95% disease involving small, codominant RCA. 2. Normal left ventricular contraction and filling pressure.  Recommendations: 1. Outpatient cardiac surgery consultation for CABG.  Add isosorbide  mononitrate 15 mg daily for antianginal therapy. 2. Aggressive secondary prevention of coronary artery disease; consider escalation of statin therapy as tolerated. 3. If no evidence of bleeding or vascular injury at catheterization site, apixaban  5 mg twice daily should be restarted tomorrow  morning.  Add aspirin  81 mg daily pending surgical consultation.  Sammy Crisp, MD [facility]  Findings Coronary Findings Diagnostic  Dominance: Co-dominant  Left Main Vessel is large. Vessel is angiographically normal.  Left Anterior Descending Prox LAD to Mid LAD lesion  is 85% stenosed.  First Diagonal Branch Vessel is large in size. 1st Diag lesion is 80% stenosed.  Ramus Intermedius Vessel is moderate in size. Ramus lesion is 95% stenosed.  Left Circumflex Vessel is large. Mid Cx lesion is 40% stenosed.  First Obtuse Marginal Branch Vessel is large in size. 1st Mrg lesion is 70% stenosed.  Second Obtuse Marginal Branch Vessel is moderate in size.  First Left Posterolateral Branch Vessel is small in size.  Second Left Posterolateral Branch Vessel is small in size.  Right Coronary Artery Vessel is small. Prox RCA lesion is 95% stenosed.  Right Posterior Descending Artery Vessel is small in size.  Intervention  No interventions have been documented.     ECHOCARDIOGRAM  ECHOCARDIOGRAM COMPLETE 07/07/2019  Narrative ECHOCARDIOGRAM REPORT    Patient Name:   Vanessa [name]  Date of Exam: 07/07/2019 Medical Rec #:  [id number]     Height:       68.0 in Accession #:    [id number]    Weight:       194.8 lb Date of Birth:  [birth year]    BSA:          2.021 m Patient Age:    68 years      BP:           140/60 mmHg Patient Gender: F             HR:           64 bpm. Exam Location:  [address]  Procedure: 3D Echo, 2D Echo, Cardiac Doppler, Color Doppler and Strain Analysis  Indications:    R06.00 Dyspnea on exertion  History:        Patient has prior history of Echocardiogram examinations, most recent 02/19/2016. Arrythmias:SVT and Atrial Fibrillation; Risk Factors:Hypertension, Dyslipidemia and Family History of Coronary Artery Disease.  Sonographer:    Henriette Lofty, RDCS Referring Phys: [id number] Narvis Bad  IMPRESSIONS   1. Left ventricular ejection fraction, by estimation, is 60 to 65%. The left ventricle has normal function. The left ventricle has no regional wall motion abnormalities. Left ventricular diastolic parameters are indeterminate. Elevated left ventricular end-diastolic pressure. The average left  ventricular global longitudinal strain is -22.3 %. 2. Right ventricular systolic function is normal. The right ventricular size is normal. 3. The mitral valve is normal in structure. Trivial mitral valve regurgitation. No evidence of mitral stenosis. 4. The aortic valve is tricuspid. Aortic valve regurgitation is not visualized. Moderate aortic valve sclerosis/calcification is present, without any evidence of aortic stenosis. 5. The inferior vena cava is normal in size with greater than 50% respiratory variability, suggesting right atrial pressure of 3 mmHg.  Comparison(s): 02/19/16 EF 55-60%.  FINDINGS Left Ventricle: Left ventricular ejection fraction, by estimation, is 60 to 65%. The left ventricle has normal function. The left ventricle has no regional wall motion abnormalities. The average left ventricular global longitudinal strain is -22.3 %. The left ventricular internal cavity size was normal in size. There is no left ventricular hypertrophy. Left ventricular diastolic parameters are indeterminate. Elevated left ventricular end-diastolic pressure.  Right Ventricle: The right ventricular size is normal. No increase in right ventricular wall thickness. Right ventricular systolic function is normal.  Left Atrium: Left  atrial size was normal in size.  Right Atrium: Right atrial size was normal in size.  Pericardium: There is no evidence of pericardial effusion.  Mitral Valve: The mitral valve is normal in structure. Normal mobility of the mitral valve leaflets. Mild mitral annular calcification. Trivial mitral valve regurgitation. No evidence of mitral valve stenosis.  Tricuspid Valve: The tricuspid valve is normal in structure. Tricuspid valve regurgitation is not demonstrated. No evidence of tricuspid stenosis.  Aortic Valve: The aortic valve is tricuspid. Aortic valve regurgitation is not visualized. Moderate aortic valve sclerosis/calcification is present, without any evidence of  aortic stenosis.  Pulmonic Valve: The pulmonic valve was normal in structure. Pulmonic valve regurgitation is not visualized. No evidence of pulmonic stenosis.  Aorta: The aortic root is normal in size and structure.  Venous: The inferior vena cava is normal in size with greater than 50% respiratory variability, suggesting right atrial pressure of 3 mmHg.  IAS/Shunts: No atrial level shunt detected by color flow Doppler.   LEFT VENTRICLE PLAX 2D LVIDd:         5.00 cm  Diastology LVIDs:         3.20 cm  LV e' lateral:   6.96 cm/s LV PW:         1.00 cm  LV E/e' lateral: 15.2 LV IVS:        0.90 cm  LV e' medial:    8.38 cm/s LVOT diam:     2.00 cm  LV E/e' medial:  12.6 LV SV:         88 LV SV Index:   44       2D Longitudinal Strain LVOT Area:     3.14 cm 2D Strain GLS (A2C):   -22.8 % 2D Strain GLS (A3C):   -21.5 % 2D Strain GLS (A4C):   -22.7 % 2D Strain GLS Avg:     -22.3 %  3D Volume EF: 3D EF:        61 % LV EDV:       117 ml LV ESV:       46 ml LV SV:        71 ml  RIGHT VENTRICLE RV Basal diam:  2.90 cm RV S prime:     10.00 cm/s TAPSE (M-mode): 2.5 cm  LEFT ATRIUM             Index       RIGHT ATRIUM           Index LA diam:        3.90 cm 1.93 cm/m  RA Pressure: 3.00 mmHg LA Vol (A2C):   73.4 ml 36.32 ml/m RA Area:     10.40 cm LA Vol (A4C):   54.9 ml 27.16 ml/m RA Volume:   19.00 ml  9.40 ml/m LA Biplane Vol: 64.0 ml 31.67 ml/m AORTIC VALVE LVOT Vmax:   126.00 cm/s LVOT Vmean:  83.500 cm/s LVOT VTI:    0.281 m  AORTA Ao Root diam: 3.10 cm Ao Asc diam:  3.30 cm  MITRAL VALVE                TRICUSPID VALVE Estimated RAP:  3.00 mmHg  MV E velocity: 106.00 cm/s  SHUNTS MV A velocity: 77.10 cm/s   Systemic VTI:  0.28 m MV E/A ratio:  1.37         Systemic Diam: 2.00 cm  Gaylyn Keas MD Electronically signed by Gaylyn Keas MD Signature Date/Time: 07/07/2019/8:15:20 AM  Final   TEE  ECHO INTRAOPERATIVE TEE  07/10/2020  Narrative *INTRAOPERATIVE TRANSESOPHAGEAL REPORT *    Patient Name:   SADHANA FRATER   Date of Exam: 07/10/2020 Medical Rec #:  [id number]      Height:       68.0 in Accession #:    [id number]     Weight:       181.0 lb Date of Birth:  [birth year]     BSA:          1.96 m Patient Age:    69 years       BP:           144/48 mmHg Patient Gender: F              HR:           51 bpm. Exam Location:  Anesthesiology  Transesophogeal exam was perform intraoperatively during surgical procedure. Patient was closely monitored under general anesthesia during the entirety of examination.  Indications:     CAD Native Vessel Sonographer:     Joleen Navy RDCS (AE) Performing Phys: [id number] HENDRICKSON Diagnosing Phys: Gorman Laughter MD  Complications: No known complications during this procedure. POST-OP IMPRESSIONS - Left Ventricle: The left ventricle is unchanged from pre-bypass. - Right Ventricle: The right ventricle appears unchanged from pre-bypass. - Aorta: The aorta appears unchanged from pre-bypass. - Left Atrium: The left atrium appears unchanged from pre-bypass. - Left Atrial Appendage: Has been surgically closed. - Aortic Valve: The aortic valve appears unchanged from pre-bypass. - Mitral Valve: The mitral valve appears unchanged from pre-bypass. - Tricuspid Valve: The tricuspid valve appears unchanged from pre-bypass. - Pulmonic Valve: The pulmonic valve appears unchanged from pre-bypass. - Interatrial Septum: The interatrial septum appears unchanged from pre-bypass. - Interventricular Septum: The interventricular septum appears unchanged from pre-bypass. - Pericardium: The pericardium appears unchanged from pre-bypass.  PRE-OP FINDINGS Left Ventricle: The left ventricle has normal systolic function, with an ejection fraction of 55-60%. The cavity size was normal. There is concentric left ventricular hypertrophy. There is mild concentric left ventricular  hypertrophy.   Right Ventricle: The right ventricle has normal systolic function. The cavity was normal. There is no increase in right ventricular wall thickness.  Left Atrium: Left atrial size was dilated. No left atrial/left atrial appendage thrombus was detected.  Right Atrium: Right atrial size was normal in size.  Interatrial Septum: No atrial level shunt detected by color flow Doppler. There is no evidence of a patent foramen ovale.  Pericardium: Trivial pericardial effusion is present.  Mitral Valve: The mitral valve is normal in structure. Mitral valve regurgitation is mild by color flow Doppler. There is no evidence of mitral valve vegetation.  Tricuspid Valve: The tricuspid valve was normal in structure. Tricuspid valve regurgitation is mild by color flow Doppler. There is no evidence of tricuspid valve vegetation.  Aortic Valve: The aortic valve is tricuspid Aortic valve regurgitation was not visualized by color flow Doppler. There is no stenosis of the aortic valve. There is no evidence of aortic valve vegetation.   Pulmonic Valve: The pulmonic valve was normal in structure. Pulmonic valve regurgitation is trivial by color flow Doppler.   Aorta: There is evidence of plaque in the descending aorta; Grade III, measuring 3-51mm in size.  Shunts: There is no evidence of an atrial septal defect.  +-------------+------------++ AORTIC VALVE              +-------------+------------++ AV Vmax:     150.50  cm/s  +-------------+------------++ AV Vmean:    102.300 cm/s +-------------+------------++ AV VTI:      0.441 m      +-------------+------------++ AV Peak Grad:9.1 mmHg     +-------------+------------++ AV Mean Grad:5.0 mmHg     +-------------+------------++   Gorman Laughter MD Electronically signed by Gorman Laughter MD Signature Date/Time: 07/10/2020/3:53:43 PM    Final  MONITORS  LONG TERM MONITOR (3-14 DAYS)  04/16/2023  Narrative Patch Wear Time:  7 days and 0 hours (2024-12-23T14:36:24-0500 to 2024-12-30T15:00:03-499)  Patient had a min HR of 45 bpm, max HR of 194 bpm, and avg HR of 60 bpm. Predominant underlying rhythm was Sinus Rhythm. 33 Supraventricular Tachycardia runs occurred, the run with the fastest interval lasting 6 beats with a max rate of 194 bpm, the longest lasting 11 beats with an avg rate of 109 bpm. Idioventricular Rhythm was present. Isolated SVEs were occasional (2.9%, 16160), SVE Couplets were rare (<1.0%, 527), and SVE Triplets were rare (<1.0%, 58). Isolated VEs were rare (<1.0%), VE Couplets were rare (<1.0%), and no VE Triplets were present. Ventricular Trigeminy was present.  SUMMARY: the basic rhythm is normal sinus with an average HR of 60 bpm. There is no atrial fibrillation or flutter. No bradycardic events or high-grade AV block. No sustained arrhythmias. Otherwise, as detailed above with low arrhythmia burden (2.9% SVE's and <1% VE's).   CT SCANS  CT CORONARY FRACTIONAL FLOW RESERVE DATA PREP 06/23/2020  Narrative EXAM: FFRCT ANALYSIS  FINDINGS: FFRct analysis was performed on the original cardiac CT angiogram dataset. Diagrammatic representation of the FFRct analysis is provided in a separate PDF document in PACS. This dictation was created using the PDF document and an interactive 3D model of the results. 3D model is not available in the EMR/PACS. Normal FFR range is >0.80.  1. Left Main: No significant stenosis. LM FFR = 0.99.  2. LAD: Possible stenosis in mid LAD and diagonal. Proximal FFR = 0.97, Mid FFR = 0.79, Distal FFR could not be assessed. Diagonal: Proximal FFR = 0.96, Mid FFR = 0.62, distal FFR = 0.59.  3. LCX: Possible significant stenosis mid LCX/OM. Proximal FFR = 0.98, Mid FFR = 0.69, Distal FFR= 0.61. OM1: Proximal FFR = 0.96, Mid FFR = 0.74, Distal FFR = 0.69.  4. Ramus:Occluded proximally  5. RCA: Possible significant stenosis in  mid RCA. Proximal FFR = 0.99, Mid FFR = 0.57. Distal FFR could not be assessed.  IMPRESSION: 1. Coronary CTA FFR flow analysis demonstrates possible flow limiting lesions in the mid LAD (0.97>0.79), mid Diagonal (0.96>0.62), mid LCx (0.98>0.69), mid OM1 (0.96>0.69) and mid RCA (0.99>0.57) and occluded Ramus.  2.  Recommend cardiac catheterization.  3.  Findings discussed with Dr. Lawana Pray.  Sophia Dustman Turner   Electronically Signed By: Gaylyn Keas On: 06/25/2020 17:18   CT SCANS  CT CORONARY MORPH W/CTA COR W/SCORE 06/21/2020  Addendum 06/23/2020 10:52 PM ADDENDUM REPORT: 06/23/2020 22:50  EXAM: Cardiac/Coronary  CT  TECHNIQUE: The patient was scanned on a [employer].  FINDINGS: A 120 kV prospective scan was triggered in the descending thoracic aorta at 111 HU's. Axial non-contrast 3 mm slices were carried out through the heart. The data set was analyzed on a dedicated work station and scored using the Agatson method. Gantry rotation speed was 250 msecs and collimation was .6 mm. No beta blockade and 0.8 mg of sl NTG was given. The 3D data set was reconstructed in 5% intervals of the 67-82 % of the R-R cycle. Diastolic  phases were analyzed on a dedicated work station using MPR, MIP and VRT modes. The patient received 80 cc of contrast.  Aorta:  Normal size.  Scattered calcifications.  No dissection.  Aortic Valve:  Trileaflet.  No calcifications.  Coronary Arteries:  Normal coronary origin.  Right dominance.  RCA is a large dominant artery that gives rise to PDA and PLVB. 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 There is motion artifact in the RCA which limits accurate assessment. There is at least mild calcified plaque in the proximal RCA with associated stenosis of 25-49%. Distal to this lesion there is an ill defined area that may represent non calcified plaque but cannot accurately quantitate stenosis.  Left main  is a large artery that gives rise to LAD, Ramus and LCX arteries. There is minimal calcified plaque in the ostial LM with associated stenosis of 0-24%.  LAD is a large vessel that gives rise to a large D1. The LAD is diffusely diseased throughout the proximal and mid portions. There is mild calcified plaque in the proximal LAD with associated stenosis of 25-49%. The calcified plaque extends into the proximal D1 with associated stenosis of 25-49%. There is severe non calcified plaque in the proximal portion of D1 with associated stenosis of 70-99%. There is moderate calcified plaque in the mid LAD with associated stenosis of 50-69%.  The Ramus is a moderate sized vessel. There is a non calcified plaque in the proximal ramus that may be severe with associated stenosis of 70-99% but this is not well visualized. There is mild calcified plaque in the mid to distal ramus with associated stenosis of 25-49%.  LCX is a non-dominant artery that gives rise to one large OM1 branch. There is mild calcified plaque in the proximal OM1 and mild non calcified plaque with associated stenosis of 25-49%. There is mixed plaque in the mid OM1 that is difficult to define but may be as much as 50-69% stenosis or more. There is moderate non calcified plaque in the mid LCx after the takeoff of OM1 with associated stenosis of 50-69%.  Other findings:  Normal pulmonary vein drainage into the left atrium.  Normal let atrial appendage without a thrombus.  Normal size of the pulmonary artery.  IMPRESSION: 1. Coronary calcium  score of 339. This was 87th percentile for age and sex matched control.  2.  Normal coronary origin with right dominance.  3.  Moderate atherosclerosis.  CAD RADS 3.  4. Consider symptom-guided anti-ischemic and preventive pharmacotherapy as well as risk factor modification per guideline-directed care.  5.  Consider cardiac catheterization.  6.  This study has been submitted  for FFR analysis.  Gaylyn Keas   Electronically Signed By: Gaylyn Keas On: 06/23/2020 22:50  Narrative EXAM: OVER-READ INTERPRETATION  CT CHEST  The following report is an over-read performed by radiologist Dr. Alexandria Angel of [facility] on 06/21/2020. This over-read does not include interpretation of cardiac or coronary anatomy or pathology. The coronary calcium  score/coronary CTA interpretation by the cardiologist is attached.  COMPARISON:  None.  FINDINGS: Atherosclerotic calcifications in the thoracic aorta. Within the visualized portions of the thorax there are no suspicious appearing pulmonary nodules or masses, there is no acute consolidative airspace disease, no pleural effusions, no pneumothorax and no lymphadenopathy. Visualized portions of the upper abdomen demonstrate an incompletely imaged low-attenuation lesion in the left lobe of the liver, not characterized on today's examination, but statistically likely to represent a  cyst. There are no aggressive appearing lytic or blastic lesions noted in the visualized portions of the skeleton.  IMPRESSION: 1.  Aortic Atherosclerosis (ICD10-I70.0).  Electronically Signed: By: Alexandria Angel M.D. On: 06/21/2020 09:12   CT SCANS  CT CARDIAC SCORING (SELF PAY ONLY) 10/13/2017  Addendum 10/13/2017  5:22 PM ADDENDUM REPORT: 10/13/2017 17:19  EXAM: OVER-READ INTERPRETATION  CT CHEST  The following report is an over-read performed by radiologist Dr. Asenath Blacker [facility] on 10/13/2017. This over-read does not include interpretation of cardiac or coronary anatomy or pathology. The coronary calcium  score interpretation by the cardiologist is attached.  COMPARISON:  None.  FINDINGS: Scattered aortic calcifications. Aorta is normal caliber. Heart is normal size. No adenopathy in the lower mediastinum or hila. Visualized lungs clear. No effusions.  Imaging into the upper abdomen  shows no acute findings. Chest wall soft tissues are unremarkable. No acute bony abnormality.  IMPRESSION: No acute or significant extracardiac abnormality.   Electronically Signed By: Janeece Mechanic M.D. On: 10/13/2017 17:19  Narrative CLINICAL DATA:  Risk stratification  EXAM: Coronary Calcium  Score  TECHNIQUE: The patient was scanned on a [employer]. Axial non-contrast 3 mm slices were carried out through the heart. The data set was analyzed on a dedicated work station and scored using the Agatson method.  MEDICATIONS: None.  FINDINGS: Non-cardiac: See separate report from [facility].  Ascending Aorta: Normal size, trivial calcifications in the ascending and moderate calcifications in the descending aorta.  Pericardium: Normal.  Coronary arteries: Normal origin.  IMPRESSION: Coronary calcium  score of 192. This was 31 percentile for age and sex matched control.  Electronically Signed: By: Christoper Crafts On: 10/13/2017 16:02     ______________________________________________________________________________________________      EKG:        Recent Labs: 02/02/2023: ALT 17  Recent Lipid Panel    Component Value Date/Time   CHOL 136 02/02/2023 1607   TRIG 89 02/02/2023 1607   HDL 57 02/02/2023 1607   CHOLHDL 2.4 02/02/2023 1607   LDLCALC 62 02/02/2023 1607     Risk Assessment/Calculations:    CHA2DS2-VASc Score = 4   This indicates a 4.8% annual risk of stroke. The patient's score is based upon: CHF History: 0 HTN History: 1 Diabetes History: 0 Stroke History: 0 Vascular Disease History: 1 Age Score: 1 Gender Score: 1               Physical Exam:    VS:  BP 132/68   Pulse 60   Ht 5\' 8"  (1.727 m)   Wt 189 lb (85.7 kg)   SpO2 99%   BMI 28.74 kg/m     Wt Readings from Last 3 Encounters:  08/05/23 189 lb (85.7 kg)  07/23/23 188 lb (85.3 kg)  04/05/23 189 lb (85.7 kg)     GEN:  Well nourished, well  developed in no acute distress HEENT: Normal NECK: No JVD; No carotid bruits LYMPHATICS: No lymphadenopathy CARDIAC: RRR, no murmurs, rubs, gallops RESPIRATORY:  Clear to auscultation without rales, wheezing or rhonchi  ABDOMEN: Soft, non-tender, non-distended MUSCULOSKELETAL: Trace right ankle edema; No deformity  SKIN: Warm and dry NEUROLOGIC:  Alert and oriented x 3 PSYCHIATRIC:  Normal affect   Assessment & Plan Paroxysmal atrial fibrillation (HCC) Minimally symptomatic at present.  Continue diltiazem  120 mg daily.  Continue apixaban  for anticoagulation. Coronary artery disease involving native coronary artery of native heart without angina pectoris Patient stable following CABG.  No antiplatelet therapy because of  chronic oral anticoagulation.  Continue high intensity statin drug with rosuvastatin  20 mg daily. Essential hypertension Blood pressure is under optimal control on valsartan /hydrochlorothiazide  and diltiazem .  She reports occasional episodes of elevated blood pressures at home and I suspect these are stress related after discussion with her today.  She will continue on her current medical treatment. Mixed hyperlipidemia Treated with rosuvastatin  20 mg daily.  Last lipids were improved with a cholesterol 136, HDL 57, LDL 62. Bilateral carotid artery stenosis Less than 40% right ICA stenosis and continued patency of the left carotid endarterectomy site based on recent carotid ultrasound personally reviewed today.  Continue current management as outlined above.            Medication Adjustments/Labs and Tests Ordered: Current medicines are reviewed at length with the patient today.  Concerns regarding medicines are outlined above.  No orders of the defined types were placed in this encounter.  No orders of the defined types were placed in this encounter.   There are no Patient Instructions on file for this visit.   Signed, Arnoldo Lapping, MD  08/05/2023 8:48 AM     [facility]

## 2023-08-05 NOTE — Patient Instructions (Signed)
 Follow-Up: At [facility], you and your health needs are our priority.  As part of our continuing mission to provide you with exceptional heart care, our providers are all part of one team.  This team includes your primary Cardiologist (physician) and Advanced Practice Providers or APPs (Physician Assistants and Nurse Practitioners) who all work together to provide you with the care you need, when you need it.  Your next appointment:   6 month(s)  Provider:   Marlyse Single, PA, then Arnoldo Lapping, MD  will plan to see you in 1 year(s)    1st Floor: - Lobby - Registration  - Pharmacy  - Lab - Cafe  2nd Floor: - PV Lab - Diagnostic Testing (echo, CT, nuclear med)  3rd Floor: - Vacant  4th Floor: - TCTS (cardiothoracic surgery) - AFib Clinic - Structural Heart Clinic - Vascular Surgery  - Vascular Ultrasound  5th Floor: - HeartCare Cardiology (general and EP) - Clinical Pharmacy for coumadin, hypertension, lipid, weight-loss medications, and med management appointments    Valet parking services will be available as well.

## 2023-08-05 NOTE — Assessment & Plan Note (Signed)
 Treated with rosuvastatin  20 mg daily.  Last lipids were improved with a cholesterol 136, HDL 57, LDL 62.

## 2023-08-05 NOTE — Assessment & Plan Note (Signed)
 Less than 40% right ICA stenosis and continued patency of the left carotid endarterectomy site based on recent carotid ultrasound personally reviewed today.  Continue current management as outlined above.

## 2023-08-05 NOTE — Assessment & Plan Note (Signed)
 Patient stable following CABG.  No antiplatelet therapy because of chronic oral anticoagulation.  Continue high intensity statin drug with rosuvastatin  20 mg daily.

## 2023-08-05 NOTE — Assessment & Plan Note (Signed)
 Minimally symptomatic at present.  Continue diltiazem  120 mg daily.  Continue apixaban  for anticoagulation.

## 2023-08-11 ENCOUNTER — Ambulatory Visit (INDEPENDENT_AMBULATORY_CARE_PROVIDER_SITE_OTHER)
Admit: 2023-08-11 | Discharge: 2023-08-11 | Disposition: A | Discharge status: Home / Self Care | Attending: Family Medicine | Admitting: Family Medicine

## 2023-08-11 ENCOUNTER — Encounter (HOSPITAL_BASED_OUTPATIENT_CLINIC_OR_DEPARTMENT_OTHER): Payer: Self-pay

## 2023-08-11 ENCOUNTER — Ambulatory Visit (HOSPITAL_BASED_OUTPATIENT_CLINIC_OR_DEPARTMENT_OTHER)
Admission: EM | Admit: 2023-08-11 | Discharge: 2023-08-11 | Disposition: A | Discharge status: Home / Self Care | Attending: Family Medicine | Admitting: Family Medicine

## 2023-08-11 DIAGNOSIS — S0990XA Unspecified injury of head, initial encounter: Secondary | ICD-10-CM

## 2023-08-11 DIAGNOSIS — W19XXXA Unspecified fall, initial encounter: Secondary | ICD-10-CM

## 2023-08-11 DIAGNOSIS — W109XXA Fall (on) (from) unspecified stairs and steps, initial encounter: Secondary | ICD-10-CM

## 2023-08-11 DIAGNOSIS — M25561 Pain in right knee: Secondary | ICD-10-CM

## 2023-08-11 DIAGNOSIS — M79642 Pain in left hand: Secondary | ICD-10-CM | POA: Diagnosis not present

## 2023-08-11 DIAGNOSIS — S6992XA Unspecified injury of left wrist, hand and finger(s), initial encounter: Secondary | ICD-10-CM

## 2023-08-11 NOTE — ED Triage Notes (Signed)
 Tripped and fell last night approx 1600 yesterday. Struck head on door trim. Bilat knee pain. Right knee is swollen. Left thumb bruising, swelling, and limited movement. Patient is on eliquis . Having difficulty identifying words. Patient did ice knees and thumb last night.

## 2023-08-11 NOTE — Discharge Instructions (Signed)
 Your x-rays were normal.  Recommend continue to ice the areas.  You can take Tylenol  for pain as needed Continue to monitor your symptoms and if you start developing any blurry vision, dizziness, severe headache, nausea or vomiting you need to go to the ER. Decrease computer ands TV time over the next few days.

## 2023-08-11 NOTE — ED Provider Notes (Signed)
 [name]    CSN: [id number] Arrival date & time: 08/11/23  1401      History   Chief Complaint Chief Complaint  Patient presents with   Head Injury   Knee Pain    HPI Vanessa [name] is a 73 y.o. female.   Patient is a 73 year old female that presents today with fall.  The fall occurred yesterday she was walking up her stairs and tripped over the threshold.  She landed on the right and left knee, left hand and bumped her head on the door frame.  She does have abrasions to the left side of her head.  The has complaint today is the left hand and the right knee.  She denies any loss of consciousness after hitting her head. She is on blood thinners for Afib.  She has not had any changes in her vision, dizziness, lightheadedness, nausea or vomiting.  She was a little tired after the fall yesterday and took a nap.  She has been putting ice to the areas.    Head Injury Knee Pain   Past Medical History:  Diagnosis Date   Anemia    Anxiety    AR (allergic rhinitis)    Arthritis    knees   Atrial fibrillation (HCC)    Blood transfusion without reported diagnosis    Carotid artery disease (HCC)    Cataract    bilateral /removed   Coronary artery disease    Depressed    Dysrhythmia    atrial fibrillation off and on   GERD (gastroesophageal reflux disease)    Glaucoma    HTN (hypertension)    Hyperlipidemia    Overweight(278.02)     Patient Active Problem List   Diagnosis Date Noted   Carotid artery stenosis 08/19/2021   S/P CABG x 5 07/10/2020   CAD (coronary artery disease) 07/04/2020   Abnormal cardiac CT angiography 07/01/2020   Pain of right thigh 02/04/2018   Low back pain 02/04/2018   Pain of joint of left ankle and foot 01/19/2018   Ankle pain 01/19/2018   Foot pain 01/19/2018   Family history of premature CAD 08/05/2017   Atrial fibrillation (HCC) 07/19/2017   GERD without esophagitis 07/19/2017   Mixed dyslipidemia 07/19/2017   SVT  (supraventricular tachycardia) (HCC) 11/09/2016   Left knee pain 11/01/2014   Primary osteoarthritis of left knee 11/01/2014   Chest pain 12/03/2011   HTN (hypertension) 12/03/2011   Hyperlipidemia 12/03/2011    Past Surgical History:  Procedure Laterality Date   ABDOMINAL HYSTERECTOMY     CARDIAC CATHETERIZATION     CATARACT EXTRACTION     catherization  02/1999   normal   CLIPPING OF ATRIAL APPENDAGE N/A 07/10/2020   Procedure: CLIPPING OF ATRIAL APPENDAGE;  Surgeon: Zelphia Higashi, MD;  Location: [facility] OR;  Service: Open Heart Surgery;  Laterality: N/A;   COLONOSCOPY     CORONARY ARTERY BYPASS GRAFT N/A 07/10/2020   Procedure: CORONARY ARTERY BYPASS GRAFTING (CABG)x5. LEFT INTERNAL MAMMARY ARTERY. RIGHT SAPHENOUS VEIN HARVESTING. LEFT ATRIAL APPENDAGE CLIPPING.;  Surgeon: Zelphia Higashi, MD;  Location: [facility] OR;  Service: Open Heart Surgery;  Laterality: N/A;   cyrotherapy     ENDARTERECTOMY Left 08/19/2021   Procedure: LEFT CAROTID ENDARTERECTOMY WITH 1CM BY 14CM XENOSURE ANGIOPLASTY PATCH;  Surgeon: Dannis Dy, MD;  Location: [facility] OR;  Service: Vascular;  Laterality: Left;   EP IMPLANTABLE DEVICE N/A 03/02/2016   Procedure: Loop Recorder Insertion;  Surgeon: [employer],  MD;  Location: [facility] INVASIVE CV LAB;  Service: Cardiovascular;  Laterality: N/A;   EYE SURGERY     x2   GLAUCOMA SURGERY     bilateral   KNEE ARTHROSCOPY     left knee   LEFT HEART CATH AND CORONARY ANGIOGRAPHY N/A 07/01/2020   Procedure: LEFT HEART CATH AND CORONARY ANGIOGRAPHY;  Surgeon: Sammy Crisp, MD;  Location: [facility] INVASIVE CV LAB;  Service: Cardiovascular;  Laterality: N/A;   ROTATOR CUFF REPAIR     right shoulder   TEE WITHOUT CARDIOVERSION N/A 07/10/2020   Procedure: TRANSESOPHAGEAL ECHOCARDIOGRAM (TEE);  Surgeon: Zelphia Higashi, MD;  Location: [facility] OR;  Service: Open Heart Surgery;  Laterality: N/A;   TONSILLECTOMY     removed as a tennager   UPPER GASTROINTESTINAL  ENDOSCOPY     WISDOM TOOTH EXTRACTION      OB History   No obstetric history on file.      Home Medications    Prior to Admission medications   Medication Sig Start Date End Date Taking? Authorizing Provider  apixaban  (ELIQUIS ) 5 MG TABS tablet TAKE 1 TABLET(5 MG) BY MOUTH TWICE DAILY 02/09/23   Marlyse Single T, PA-C  clotrimazole-betamethasone (LOTRISONE) cream Apply 1 application. topically 2 (two) times daily as needed (irritation).    [provider]  Coenzyme Q10 100 MG capsule Take 100 mg by mouth daily.    [provider]  diltiazem  (CARDIZEM  SR) 120 MG 12 hr capsule Take 1 capsule (120 mg total) by mouth daily. Patient taking differently: Take 240 mg by mouth daily. 02/09/23   Marlyse Single T, PA-C  esomeprazole  (NEXIUM ) 40 MG capsule Take 40 mg by mouth daily at 12 noon.    [provider]  hydrochlorothiazide  (HYDRODIURIL ) 12.5 MG tablet Take 1 tablet by mouth daily. 03/08/23   [provider]  Multiple Vitamins-Minerals (ALIVE ONCE DAILY WOMENS 50+) TABS Take 1 tablet by mouth daily.    [provider]  Polyethylene Glycol 400 (BLINK TEARS) 0.25 % SOLN Place 1 drop into both eyes daily as needed (Dry eyes).    [provider]  Probiotic Product (PROBIOTIC DAILY PO) Take by mouth. Taking over the counter Floragen    [provider]  rosuvastatin  (CRESTOR ) 20 MG tablet Take 1 tablet (20 mg total) by mouth daily. 02/09/23   Marlyse Single T, PA-C  valsartan -hydrochlorothiazide  (DIOVAN  HCT) 160-12.5 MG tablet Take 1 tablet by mouth daily. 02/09/23   Gabino Joe, PA-C    Family History Family History  Problem Relation Age of Onset   Stroke Mother    Heart attack Father    Colon cancer Neg Hx    Colon polyps Neg Hx    Esophageal cancer Neg Hx    Rectal cancer Neg Hx    Stomach cancer Neg Hx     Social History Social History   Tobacco Use   Smoking status: Never   Smokeless tobacco: Never  Vaping Use    Vaping status: Never Used  Substance Use Topics   Alcohol use: No    Comment: very rarely   Drug use: No     Allergies   Codeine   Review of Systems Review of Systems See HPI  Physical Exam Triage Vital Signs ED Triage Vitals  Encounter Vitals Group     BP 08/11/23 1420 (!) 145/77     Systolic BP Percentile --      Diastolic BP Percentile --      Pulse Rate 08/11/23  1420 62     Resp 08/11/23 1420 20     Temp 08/11/23 1420 98.6 F (37 C)     Temp src --      SpO2 08/11/23 1420 97 %     Weight --      Height --      Head Circumference --      Peak Flow --      Pain Score 08/11/23 1421 7     Pain Loc --      Pain Education --      Exclude from Growth Chart --    No data found.  Updated Vital Signs BP (!) 145/77 (BP Location: Right Arm)   Pulse 62   Temp 98.6 F (37 C)   Resp 20   SpO2 97%   Visual Acuity Right Eye Distance:   Left Eye Distance:   Bilateral Distance:    Right Eye Near:   Left Eye Near:    Bilateral Near:     Physical Exam Constitutional:      General: She is not in acute distress.    Appearance: Normal appearance. She is not ill-appearing.  HENT:     Head:     Comments: 2 abrasions to the left forehead.  Eyes:     General: Lids are normal. No visual field deficit.    Extraocular Movements: Extraocular movements intact.     Conjunctiva/sclera: Conjunctivae normal.     Comments: Bilateral anisocoria  Pulmonary:     Effort: Pulmonary effort is normal.  Musculoskeletal:        General: Swelling, tenderness and signs of injury present.     Comments: Swelling, bruising extending from the base of the left thumb into the hand. Normal range of motion Mild swelling to the right knee generalized.  No bruising or deformity  Skin:    General: Skin is warm and dry.  Neurological:     General: No focal deficit present.     Mental Status: She is alert.     Cranial Nerves: No cranial nerve deficit.     Motor: No weakness.     Gait:  Gait normal.  Psychiatric:        Mood and Affect: Mood normal.      UC Treatments / Results  Labs (all labs ordered are listed, but only abnormal results are displayed) Labs Reviewed - No data to display  EKG   Radiology DG Knee Complete 4 Views Right Result Date: 08/11/2023 CLINICAL DATA:  Fall, right knee pain and swelling EXAM: RIGHT KNEE - COMPLETE 4+ VIEW COMPARISON:  11/04/2017 FINDINGS: Normal alignment without acute osseous finding, definite fracture or large effusion. Right knee degenerative osteoarthritis noted most pronounced in the patellofemoral compartment with joint space loss, sclerosis and osteophyte formation. Soft tissues unremarkable. Vascular calcifications noted. IMPRESSION: 1. Right knee degenerative osteoarthritis. 2. No acute finding by plain radiography. Electronically Signed   By: Melven Stable.  Shick M.D.   On: 08/11/2023 15:01   DG Hand Complete Left Result Date: 08/11/2023 CLINICAL DATA:  Fall, injury, pain EXAM: LEFT HAND - COMPLETE 3+ VIEW COMPARISON:  None available FINDINGS: Bones are osteopenic. Mild degenerative osteoarthritis changes of the left first CMC joint at the wrist as well as the IP joints throughout all 5 digits with sclerosis, bony spurring, and joint space loss. No acute osseous finding, definite fracture, malalignment. Soft tissues unremarkable. IMPRESSION: 1. Osteopenia and degenerative changes as above. 2. No acute finding by plain radiography. Electronically Signed  By: Marsa Skates M.D.   On: 08/11/2023 14:59    Procedures Procedures (including critical care time)  Medications Ordered in UC Medications - No data to display  Initial Impression / Assessment and Plan / UC Course  I have reviewed the triage vital signs and the nursing notes.  Pertinent labs & imaging results that were available during my care of the patient were reviewed by me and considered in my medical decision making (see chart for details).     Fall with multiple injuries  to include right knee, left hand and head. Neurological exam without any acute findings.  No concern at this time.  Recommend monitor for worsening symptoms to include concussive symptoms like nausea, vomiting, blurry vision, dizziness or lightheadedness.  If symptoms worsen she will need to go to the ER. Recommended decrease screen time to give the brain and rest.  Right knee-x-ray without any acute findings.  Most likely bruised.  Recommend RICE the area.  Tylenol  for pain as needed  Left hand-x-ray without any acute findings.  Most likely bruised recommend RICE the area.  Tylenol  for pain as needed Final Clinical Impressions(s) / UC Diagnoses   Final diagnoses:  Fall, initial encounter  Injury of head, initial encounter  Injury of left hand, initial encounter  Acute pain of right knee     Discharge Instructions      Your x-rays were normal.  Recommend continue to ice the areas.  You can take Tylenol  for pain as needed Continue to monitor your symptoms and if you start developing any blurry vision, dizziness, severe headache, nausea or vomiting you need to go to the ER. Decrease computer ands TV time over the next few days.    ED Prescriptions   None    PDMP not reviewed this encounter.   Landa Pine, FNP 08/11/23 1729

## 2024-02-01 ENCOUNTER — Encounter: Payer: Self-pay | Admitting: *Deleted

## 2024-02-01 NOTE — Progress Notes (Unsigned)
 OFFICE NOTE:    Date:  02/02/2024  ID:  Vanessa [name], DOB Jul 12, 1950, MRN [id number] PCP: Silver Lamar LABOR, MD  [facility] Providers Cardiologist:  Ozell Fell, MD Electrophysiologist:  Soyla Gladis Norton, MD        Paroxysmal atrial fibrillation  TTE 07/07/2019: EF 60-65, no RWMA, GLS -22.3, normal RVSF, trivial MR, AV sclerosis, RAP 3 IntraOp TEE 07/10/2020: EF 55-60 Supraventricular Tachycardia  Monitor 09/17/2020: 40 runs SVT, 1 run NSVT Coronary artery disease  LHC 07/01/2020: Proximal LAD 80-90, proximal D1 80, proximal RI 90 OM1 70, RCA 95 S/p CABG, LAA clipping in 06/2020 Carotid artery disease S/p L CEA in 08/2021 Carotid US  07/09/2022: R ICA 1-39, L CEA patent Bradycardia  Hypertension  Hyperlipidemia        Discussed the use of AI scribe software for clinical note transcription with the patient, who gave verbal consent to proceed. History of Present Illness Vanessa [name] is a 73 y.o. female who returns for follow up of AFib, CAD, carotid dz. Last seen by Dr. Fell in 07/2023.   She is here alone. She notes symptoms of fatigue, leg swelling, and shortness of breath. She experiences persistent fatigue and leg swelling, with the swelling being most pronounced by the end of the day and improving overnight. Her legs hurt and swell, particularly when not elevated. Support hose provides minimal relief. She has also noted a recent weight gain of approximately five pounds. She experiences shortness of breath with minimal exertion, such as walking short distances or climbing a few steps. This symptom is new and was not present six months ago. No orthopnea or paroxysmal nocturnal dyspnea is reported. No recent episodes of syncope.  She also describes occasional left-sided chest discomfort that is not associated with exertion and lasts about five minutes. The discomfort is described as an ache located under the left breast and does not radiate.  She did not take her  medications on the morning of the visit.  No fever, cough, or recent infections. Occasional headaches on the right side, starting from the back of the neck. No vomiting blood or having bloody stools, but reports occasional black stools. She denies smoking.    ROS-See HPI     Studies Reviewed:  EKG Interpretation Date/Time:  Wednesday February 02 2024 09:25:26 EDT Ventricular Rate:  62 PR Interval:  124 QRS Duration:  88 QT Interval:  430 QTC Calculation: 436 R Axis:   -22  Text Interpretation: Normal sinus rhythm Nonspecific T wave abnormality RSR prime V1-2 Confirmed by Lelon Hamilton [phone]) on 02/02/2024 9:35:36 AM    Results LABS Total cholesterol: 134 (11/30/2023) Triglycerides: 124 (11/30/2023) HDL: 52 (11/30/2023) LDL: 61 (11/30/2023) Potassium: 4.6 (11/30/2023) Creatinine: 1.09 (11/30/2023) eGFR: 53 (11/30/2023) BNP: 149 (11/2023) TSH: 1.759 (04/2023)   Risk Assessment/Calculations: CHA2DS2-VASc Score = 4   This indicates a 4.8% annual risk of stroke. The patient's score is based upon: CHF History: 0 HTN History: 1 Diabetes History: 0 Stroke History: 0 Vascular Disease History: 1 Age Score: 1 Gender Score: 1      HYPERTENSION CONTROL Vitals:   02/02/24 0918 02/02/24 1024  BP: (!) 173/71 (!) 180/76    The patient's blood pressure is elevated above target today.  In order to address the patient's elevated BP: A new medication was prescribed today.         Physical Exam:  VS:  BP (!) 180/76   Pulse 68   Ht 5' 8 (1.727 m)  Wt 194 lb (88 kg)   SpO2 96%   BMI 29.50 kg/m        Wt Readings from Last 3 Encounters:  02/02/24 194 lb (88 kg)  08/05/23 189 lb (85.7 kg)  07/23/23 188 lb (85.3 kg)    Constitutional:      Appearance: Healthy appearance. Not in distress.  Neck:     Vascular: No JVR. JVD normal.  Pulmonary:     Breath sounds: Normal breath sounds. No wheezing. No rales.  Cardiovascular:     Normal rate. Regular rhythm.      Murmurs: There is no murmur.  Edema:    Peripheral edema present.    Pretibial: bilateral trace edema of the pretibial area.    Ankle: bilateral trace edema of the ankle. Abdominal:     Palpations: Abdomen is soft.       Assessment and Plan:    Assessment & Plan Coronary artery disease involving native coronary artery of native heart with angina pectoris Status post CABG in March 2022. She notes left-sided chest aching and exertional dyspnea. EKG without acute changes today. Symptoms may be multifactorial, but ischemia or graft failure needs to be ruled out. She is not on antiplatelet Rx as she is on anticoagulation.  - Obtain Lexiscan  MPI to rule out ischemia. - Obtain echocardiogram to reassess LV function. - Start isosorbide  mononitrate 30 mg daily. - Prescribe nitroglycerin  as needed. - Continue rosuvastatin  20 mg daily. - Continue valsartan  HCTZ 160/12.5 mg daily, Diltiazem  120 mg once daily. - Follow up in 3 months. Shortness of breath Suspected heart failure with preserved ejection fraction due to symptoms of shortness of breath and lower extremity edema as well as recent mildly elevated BNP. Shortness of breath is likely multifactorial as she has a hx of anemia and is limited in activity with back pain. Leg swelling may also be, at least partially, related to venous insufficiency. - Order echocardiogram to reassess LV function. - Stop hydrochlorothiazide  12.5 mg daily. - Start Lasix  20 mg daily. - Continue Valsartan /hydrochlorothiazide  160/12.5 mg once daily  - Order BMET today and repeat BMET in one week. - CBC, NT Pro BNP today.  Bilateral carotid artery stenosis Status post left CEA in May 2023. Patent left CEA on carotid ultrasound in March 2024. Managed by Vascular Surgery. Paroxysmal atrial fibrillation (HCC) Maintaining sinus rhythm. - Continue diltiazem  120 mg daily. - Continue Eliquis  5 mg twice daily. - Order BMP and CBC today. Essential hypertension Blood  pressure is currently uncontrolled. She did not take morning meds yet today. - Advise to monitor blood pressures at home. - Continue diltiazem  120 mg daily. - Continue Diovan  HCTZ 160/12.5 mg daily. - Start Lasix  20 mg daily. - Stop hydrochlorothiazide  12.5 mg once daily  - Start Imdur  30 mg daily. - Follow up in 3 months. Mixed hyperlipidemia LDL at goal in August 2025. Continue Rosuvastatin  20 mg once daily.       Informed Consent   Shared Decision Making/Informed Consent The risks [chest pain, shortness of breath, cardiac arrhythmias, dizziness, blood pressure fluctuations, myocardial infarction, stroke/transient ischemic attack, nausea, vomiting, allergic reaction, radiation exposure, metallic taste sensation and life-threatening complications (estimated to be 1 in 10,000)], benefits (risk stratification, diagnosing coronary artery disease, treatment guidance) and alternatives of a nuclear stress test were discussed in detail with Ms. Chevere and she agrees to proceed.     Dispo:  Return in about 3 months (around 05/04/2024) for Routine Follow Up, w/ Glendia Ferrier, PA-C.  Signed, Glendia Ferrier, PA-C

## 2024-02-01 NOTE — Assessment & Plan Note (Signed)
 Status post left CEA in May 2023. Patent left CEA on carotid ultrasound in March 2024. ***

## 2024-02-01 NOTE — Assessment & Plan Note (Signed)
 Status post CABG in March 2022. She notes left-sided chest aching and exertional dyspnea. EKG without acute changes today. Symptoms may be multifactorial, but ischemia or graft failure needs to be ruled out. She is not on antiplatelet Rx as she is on anticoagulation.  - Obtain Lexiscan  MPI to rule out ischemia. - Obtain echocardiogram to reassess LV function. - Start isosorbide  mononitrate 30 mg daily. - Prescribe nitroglycerin  as needed. - Continue rosuvastatin  20 mg daily. - Continue valsartan  HCTZ 160/12.5 mg daily, Diltiazem  120 mg once daily. - Follow up in 3 months.

## 2024-02-02 ENCOUNTER — Encounter: Payer: Self-pay | Admitting: Physician Assistant

## 2024-02-02 ENCOUNTER — Ambulatory Visit: Attending: Physician Assistant | Admitting: Physician Assistant

## 2024-02-02 ENCOUNTER — Other Ambulatory Visit: Payer: Self-pay | Admitting: Physician Assistant

## 2024-02-02 ENCOUNTER — Other Ambulatory Visit: Payer: Self-pay

## 2024-02-02 VITALS — BP 180/76 | HR 68 | Ht 68.0 in | Wt 194.0 lb

## 2024-02-02 DIAGNOSIS — I10 Essential (primary) hypertension: Secondary | ICD-10-CM

## 2024-02-02 DIAGNOSIS — I480 Paroxysmal atrial fibrillation: Secondary | ICD-10-CM | POA: Diagnosis not present

## 2024-02-02 DIAGNOSIS — I25119 Atherosclerotic heart disease of native coronary artery with unspecified angina pectoris: Secondary | ICD-10-CM

## 2024-02-02 DIAGNOSIS — R0602 Shortness of breath: Secondary | ICD-10-CM | POA: Diagnosis not present

## 2024-02-02 DIAGNOSIS — I2510 Atherosclerotic heart disease of native coronary artery without angina pectoris: Secondary | ICD-10-CM

## 2024-02-02 DIAGNOSIS — E782 Mixed hyperlipidemia: Secondary | ICD-10-CM

## 2024-02-02 DIAGNOSIS — I6523 Occlusion and stenosis of bilateral carotid arteries: Secondary | ICD-10-CM | POA: Diagnosis not present

## 2024-02-02 MED ORDER — ROSUVASTATIN CALCIUM 20 MG PO TABS: 20.0000 mg | ORAL_TABLET | Freq: Every day | ORAL | 3 refills | Status: DC

## 2024-02-02 MED ORDER — APIXABAN 5 MG PO TABS: ORAL_TABLET | ORAL | 3 refills | Status: DC

## 2024-02-02 MED ORDER — NITROGLYCERIN 0.4 MG SL SUBL: 0.4000 mg | SUBLINGUAL_TABLET | SUBLINGUAL | 3 refills | Status: AC | PRN

## 2024-02-02 MED ORDER — FUROSEMIDE 20 MG PO TABS: 20.0000 mg | ORAL_TABLET | Freq: Every day | ORAL | 3 refills | Status: DC

## 2024-02-02 MED ORDER — VALSARTAN-HYDROCHLOROTHIAZIDE 160-12.5 MG PO TABS: 1.0000 | ORAL_TABLET | Freq: Every day | ORAL | 3 refills | Status: DC

## 2024-02-02 MED ORDER — ISOSORBIDE MONONITRATE ER 30 MG PO TB24: 30.0000 mg | ORAL_TABLET | Freq: Every day | ORAL | 3 refills | Status: AC

## 2024-02-02 MED ORDER — DILTIAZEM HCL ER 120 MG PO CP12: 120.0000 mg | ORAL_CAPSULE | Freq: Every day | ORAL | 3 refills | Status: DC

## 2024-02-02 NOTE — Assessment & Plan Note (Signed)
 LDL at goal in August 2025. Continue Rosuvastatin  20 mg once daily.

## 2024-02-02 NOTE — Assessment & Plan Note (Signed)
 Maintaining sinus rhythm. - Continue diltiazem  120 mg daily. - Continue Eliquis  5 mg twice daily. - Order BMP and CBC today.

## 2024-02-02 NOTE — Patient Instructions (Addendum)
 Medication Instructions:  STOP Hydrochlorothiazide  plain rx only take in combo drug START Lasix  20mg  Take 1 tablet once day START Imdur  30mg  Take 1 tablet once a day PLEASE HOLD LASIX  AND OLMESARTAN-HCTZ ON THE MORNING OF YOUR PROCEDURE. *If you need a refill on your cardiac medications before your next appointment, please call your pharmacy*  Lab Work: TODAY-BMET, BNP & CBC REPEAT BMET IN 1 WEEK (02/09/2024) If you have labs (blood work) drawn today and your tests are completely normal, you will receive your results only by: MyChart Message (if you have MyChart) OR A paper copy in the mail If you have any lab test that is abnormal or we need to change your treatment, we will call you to review the results.  Testing/Procedures: Your physician has requested that you have a lexiscan  myoview . For further information please visit [email or web address] Please follow instruction sheet, as given.  Your physician has requested that you have an echocardiogram. Echocardiography is a painless test that uses sound waves to create images of your heart. It provides your doctor with information about the size and shape of your heart and how well your heart's chambers and valves are working. This procedure takes approximately one hour. There are no restrictions for this procedure. Please do NOT wear cologne, perfume, aftershave, or lotions (deodorant is allowed). Please arrive 15 minutes prior to your appointment time.  Please note: We ask at that you not bring children with you during ultrasound (echo/ vascular) testing. Due to room size and safety concerns, children are not allowed in the ultrasound rooms during exams. Our front office staff cannot provide observation of children in our lobby area while testing is being conducted. An adult accompanying a patient to their appointment will only be allowed in the ultrasound room at the discretion of the ultrasound technician under special circumstances. We  apologize for any inconvenience.   Follow-Up: At [facility], you and your health needs are our priority.  As part of our continuing mission to provide you with exceptional heart care, our providers are all part of one team.  This team includes your primary Cardiologist (physician) and Advanced Practice Providers or APPs (Physician Assistants and Nurse Practitioners) who all work together to provide you with the care you need, when you need it.  Your next appointment:   3 month(s)  Provider:   Ozell Fell, MD or Glendia Ferrier, PA   We recommend signing up for the patient portal called MyChart.  Sign up information is provided on this After Visit Summary.  MyChart is used to connect with patients for Virtual Visits (Telemedicine).  Patients are able to view lab/test results, encounter notes, upcoming appointments, etc.  Non-urgent messages can be sent to your provider as well.   To learn more about what you can do with MyChart, go to [email or web address].   Other Instructions

## 2024-02-03 ENCOUNTER — Ambulatory Visit: Payer: Self-pay | Admitting: Physician Assistant

## 2024-02-03 ENCOUNTER — Telehealth (HOSPITAL_COMMUNITY): Payer: Self-pay | Admitting: *Deleted

## 2024-02-03 DIAGNOSIS — R0602 Shortness of breath: Secondary | ICD-10-CM

## 2024-02-03 DIAGNOSIS — I5032 Chronic diastolic (congestive) heart failure: Secondary | ICD-10-CM

## 2024-02-03 LAB — BASIC METABOLIC PANEL WITH GFR
BUN/Creatinine Ratio: 19 (ref 12–28)
BUN: 18 mg/dL (ref 8–27)
CO2: 23 mmol/L (ref 20–29)
Calcium: 9.6 mg/dL (ref 8.7–10.3)
Chloride: 101 mmol/L (ref 96–106)
Creatinine, Ser: 0.95 mg/dL (ref 0.57–1.00)
Glucose: 108 mg/dL — ABNORMAL HIGH (ref 70–99)
Potassium: 4.2 mmol/L (ref 3.5–5.2)
Sodium: 142 mmol/L (ref 134–144)
eGFR: 63 mL/min/1.73 (ref >59)

## 2024-02-03 LAB — CBC
Hematocrit: 36.3 % (ref 34.0–46.6)
Hemoglobin: 11.9 g/dL (ref 11.1–15.9)
MCH: 29.3 pg (ref 26.6–33.0)
MCHC: 32.8 g/dL (ref 31.5–35.7)
MCV: 89 fL (ref 79–97)
Platelets: 198 x10E3/uL (ref 150–450)
RBC: 4.06 x10E6/uL (ref 3.77–5.28)
RDW: 14.6 % (ref 11.7–15.4)
WBC: 9.4 x10E3/uL (ref 3.4–10.8)

## 2024-02-03 LAB — PRO B NATRIURETIC PEPTIDE: NT-Pro BNP: 918 pg/mL — ABNORMAL HIGH (ref 0–301)

## 2024-02-03 NOTE — Telephone Encounter (Signed)
 Left detailed instructions for stress test on home answering machine per DPR.

## 2024-02-10 ENCOUNTER — Ambulatory Visit (HOSPITAL_COMMUNITY)
Admission: RE | Admit: 2024-02-10 | Discharge: 2024-02-10 | Disposition: A | Discharge status: Home / Self Care | Source: Ambulatory Visit | Attending: Internal Medicine | Admitting: Internal Medicine

## 2024-02-10 DIAGNOSIS — I10 Essential (primary) hypertension: Secondary | ICD-10-CM | POA: Insufficient documentation

## 2024-02-10 DIAGNOSIS — E782 Mixed hyperlipidemia: Secondary | ICD-10-CM | POA: Insufficient documentation

## 2024-02-10 DIAGNOSIS — I25119 Atherosclerotic heart disease of native coronary artery with unspecified angina pectoris: Secondary | ICD-10-CM | POA: Insufficient documentation

## 2024-02-10 DIAGNOSIS — R0602 Shortness of breath: Secondary | ICD-10-CM | POA: Insufficient documentation

## 2024-02-10 DIAGNOSIS — I480 Paroxysmal atrial fibrillation: Secondary | ICD-10-CM | POA: Insufficient documentation

## 2024-02-10 LAB — MYOCARDIAL PERFUSION IMAGING
LV dias vol: 128 mL (ref 46–106)
LV sys vol: 50 mL (ref 3.8–5.2)
Nuc Stress EF: 61 %
Peak HR: 73 {beats}/min
Rest HR: 59 {beats}/min
Rest Nuclear Isotope Dose: 10.8 mCi
SDS: 0
SRS: 10
SSS: 6
ST Depression (mm): 0 mm
Stress Nuclear Isotope Dose: 32.9 mCi
TID: 1.06

## 2024-02-10 LAB — BASIC METABOLIC PANEL WITH GFR
BUN/Creatinine Ratio: 21 (ref 12–28)
BUN: 25 mg/dL (ref 8–27)
CO2: 26 mmol/L (ref 20–29)
Calcium: 10.2 mg/dL (ref 8.7–10.3)
Chloride: 95 mmol/L — ABNORMAL LOW (ref 96–106)
Creatinine, Ser: 1.19 mg/dL — ABNORMAL HIGH (ref 0.57–1.00)
Glucose: 105 mg/dL — ABNORMAL HIGH (ref 70–99)
Potassium: 4.1 mmol/L (ref 3.5–5.2)
Sodium: 139 mmol/L (ref 134–144)
eGFR: 48 mL/min/1.73 — ABNORMAL LOW (ref >59)

## 2024-02-10 MED ORDER — TECHNETIUM TC 99M TETROFOSMIN IV KIT
10.8000 | PACK | Freq: Once | INTRAVENOUS | Status: AC | PRN
Start: 2024-02-10 — End: 2024-02-10
  Administered 2024-02-10: 10.8 via INTRAVENOUS

## 2024-02-10 MED ORDER — TECHNETIUM TC 99M TETROFOSMIN IV KIT
32.5000 | PACK | Freq: Once | INTRAVENOUS | Status: AC | PRN
Start: 2024-02-10 — End: 2024-02-10
  Administered 2024-02-10: 32.9 via INTRAVENOUS

## 2024-02-10 MED ORDER — FUROSEMIDE 20 MG PO TABS: ORAL_TABLET | ORAL | Status: DC

## 2024-02-10 MED ORDER — REGADENOSON 0.4 MG/5ML IV SOLN
0.4000 mg | Freq: Once | INTRAVENOUS | Status: AC
  Administered 2024-02-10: 0.4 mg via INTRAVENOUS

## 2024-02-10 MED ORDER — REGADENOSON 0.4 MG/5ML IV SOLN
INTRAVENOUS | Status: AC
  Filled 2024-02-10: qty 5

## 2024-02-11 ENCOUNTER — Encounter: Payer: Self-pay | Admitting: Physician Assistant

## 2024-02-11 ENCOUNTER — Ambulatory Visit: Payer: Self-pay | Admitting: Physician Assistant

## 2024-02-14 ENCOUNTER — Ambulatory Visit (HOSPITAL_COMMUNITY)
Admission: RE | Admit: 2024-02-14 | Discharge: 2024-02-14 | Disposition: A | Discharge status: Home / Self Care | Source: Ambulatory Visit | Attending: Physician Assistant | Admitting: Physician Assistant

## 2024-02-14 DIAGNOSIS — E782 Mixed hyperlipidemia: Secondary | ICD-10-CM | POA: Insufficient documentation

## 2024-02-14 DIAGNOSIS — I10 Essential (primary) hypertension: Secondary | ICD-10-CM | POA: Insufficient documentation

## 2024-02-14 DIAGNOSIS — I480 Paroxysmal atrial fibrillation: Secondary | ICD-10-CM | POA: Insufficient documentation

## 2024-02-14 DIAGNOSIS — R0602 Shortness of breath: Secondary | ICD-10-CM | POA: Diagnosis present

## 2024-02-14 DIAGNOSIS — I25119 Atherosclerotic heart disease of native coronary artery with unspecified angina pectoris: Secondary | ICD-10-CM | POA: Diagnosis present

## 2024-02-14 DIAGNOSIS — R079 Chest pain, unspecified: Secondary | ICD-10-CM

## 2024-02-14 MED ORDER — PERFLUTREN LIPID MICROSPHERE
1.0000 mL | INTRAVENOUS | Status: AC | PRN
  Administered 2024-02-14: 2 mL via INTRAVENOUS

## 2024-02-15 LAB — ECHOCARDIOGRAM COMPLETE
AR max vel: 2.16 cm2
AV Area VTI: 2.15 cm2
AV Area mean vel: 2.04 cm2
AV Mean grad: 5 mmHg
AV Peak grad: 8.6 mmHg
Ao pk vel: 1.47 m/s
Area-P 1/2: 3.01 cm2
Calc EF: 56.9 %
S' Lateral: 3.7 cm
Single Plane A2C EF: 56.1 %
Single Plane A4C EF: 57 %

## 2024-02-21 ENCOUNTER — Encounter: Payer: Self-pay | Admitting: Physician Assistant

## 2024-02-21 DIAGNOSIS — I5030 Unspecified diastolic (congestive) heart failure: Secondary | ICD-10-CM | POA: Insufficient documentation

## 2024-02-21 MED ORDER — DAPAGLIFLOZIN PROPANEDIOL 10 MG PO TABS: 10.0000 mg | ORAL_TABLET | Freq: Every day | ORAL | 11 refills | Status: AC

## 2024-02-21 NOTE — Telephone Encounter (Signed)
 Please arrange follow up with me in 2-3 weeks. Keep appt with Dr. Wonda in Jan 2026 as well. Glendia Ferrier, PA-C    02/21/2024 10:01 PM

## 2024-03-03 LAB — BASIC METABOLIC PANEL WITH GFR
BUN/Creatinine Ratio: 24 (ref 12–28)
BUN: 29 mg/dL — ABNORMAL HIGH (ref 8–27)
CO2: 24 mmol/L (ref 20–29)
Calcium: 9.8 mg/dL (ref 8.7–10.3)
Chloride: 97 mmol/L (ref 96–106)
Creatinine, Ser: 1.2 mg/dL — ABNORMAL HIGH (ref 0.57–1.00)
Glucose: 95 mg/dL (ref 70–99)
Potassium: 4 mmol/L (ref 3.5–5.2)
Sodium: 139 mmol/L (ref 134–144)
eGFR: 47 mL/min/1.73 — ABNORMAL LOW (ref >59)

## 2024-03-06 ENCOUNTER — Other Ambulatory Visit: Payer: Self-pay | Admitting: Physician Assistant

## 2024-03-06 DIAGNOSIS — I480 Paroxysmal atrial fibrillation: Secondary | ICD-10-CM

## 2024-03-06 NOTE — Telephone Encounter (Signed)
 Eliquis  5mg  refill request received. Patient is 73 years old, weight-88kg, Crea-1.20 on 03/02/24, Diagnosis-Afib, and last seen by Glendia Ferrier on 02/02/24. Dose is appropriate based on dosing criteria. Will send in refill to requested pharmacy.

## 2024-03-08 ENCOUNTER — Ambulatory Visit (HOSPITAL_COMMUNITY)

## 2024-03-14 ENCOUNTER — Encounter: Payer: Self-pay | Admitting: *Deleted

## 2024-03-15 NOTE — Progress Notes (Unsigned)
 OFFICE NOTE:    Date:  03/17/2024  ID:  Rock JINNY Manson, DOB April 28, 1950, MRN [id number] PCP: Silver Lamar LABOR, MD  [facility] Providers Cardiologist:  Ozell Fell, MD Electrophysiologist:  Soyla Gladis Norton, MD        Paroxysmal atrial fibrillation  TTE 07/07/2019: EF 60-65, no RWMA, GLS -22.3, normal RVSF, trivial MR, AV sclerosis, RAP 3 IntraOp TEE 07/10/2020: EF 55-60 (HFpEF) heart failure with preserved ejection fraction  TTE 02/14/24: EF 50-55, Gr 3 DD, apical/septal/anterior AK, NL RVSF, severe LAE, trivial MR, AV sclerosis, mean AV 5 mmHg  Coronary artery disease  LHC 07/01/2020: Proximal LAD 80-90, proximal D1 80, proximal RI 90 OM1 70, RCA 95 S/p CABG, LAA clipping in 06/2020 SPECT MPI 02/10/2024 septal/apical infarct, no ischemia, EF 61, no TID, intermediate risk  Supraventricular Tachycardia  Monitor 09/17/2020: 40 runs SVT, 1 run NSVT Carotid artery disease S/p L CEA in 08/2021 Carotid US  07/09/2022: R ICA 1-39, L CEA patent Bradycardia  Hypertension  Hyperlipidemia         Discussed the use of AI scribe software for clinical note transcription with the patient, who gave verbal consent to proceed. History of Present Illness Vanessa [name] is a 73 y.o. female for follow up of CHF.   She was last seen in 01/2024 for symptoms of fatigue, edema, shortness of breath, L chest pain. She was volume overloaded. I stopped her hydrochlorothiazide  and started Furosemide . I added Imdur  30 mg. BNP was elevated. TTE showed normal EF, severe diastolic dysfunction. MPI was neg for ischemia. I started her on Farxiga .   She reports that she is currently taking Lasix  twice a week and Farxiga  daily. No current chest discomfort, swelling in her legs, shortness of breath or difficulty breathing when lying flat. [place], resulting in a broken bone in her right hand. She was evaluated at [facility], where a CT of the head was ok.  She is currently wearing a splint and is following up with a careers adviser.      ROS-See HPI     Studies Reviewed:  EKG Interpretation Date/Time:  Friday March 17 2024 10:08:57 EST Ventricular Rate:  60 PR Interval:  92 QRS Duration:  100 QT Interval:  440 QTC Calculation: 440 R Axis:   -35  Text Interpretation: Sinus rhythm with short PR Left axis deviation Nonspecific T wave abnormality When compared with ECG of 02-Feb-2024 09:25, No significant change was found Confirmed by Lelon Hamilton [phone]) on 03/17/2024 10:13:24 AM    LABS 02/02/24: K 4.2, SCr 0.95, eGFR 63, NT Pro BNP 918, Hgb 11.9, PLT 198K 02/09/24: K 4.1, SCr 1.19, eGFR 48 03/02/24: K 4.0, SCr 1.20, eGFR 47  Risk Assessment/Calculations: CHA2DS2-VASc Score = 4   This indicates a 4.8% annual risk of stroke. The patient's score is based upon: CHF History: 0 HTN History: 1 Diabetes History: 0 Stroke History: 0 Vascular Disease History: 1 Age Score: 1 Gender Score: 1     HYPERTENSION CONTROL Vitals:   03/17/24 1000 03/17/24 1126  BP: (!) 142/66 (!) 140/60    The patient's blood pressure is elevated above target today.  In order to address the patient's elevated BP: Blood pressure will be monitored at home to determine if medication changes need to be made.         Physical Exam:  VS:  BP (!) 140/60   Pulse 60   Ht 5' 8 (1.727 m)  Wt 191 lb (86.6 kg)   SpO2 94%   BMI 29.04 kg/m        Wt Readings from Last 3 Encounters:  03/17/24 191 lb (86.6 kg)  02/02/24 194 lb (88 kg)  08/05/23 189 lb (85.7 kg)    Constitutional:      Appearance: Healthy appearance. Not in distress.  Neck:     Vascular: JVD normal.  Pulmonary:     Breath sounds: Normal breath sounds. No wheezing. No rales.  Cardiovascular:     Normal rate. Regular rhythm.     Murmurs: There is no murmur.  Edema:    Peripheral edema absent.  Abdominal:     Palpations: Abdomen is soft.  Musculoskeletal:     Comments: Ulnar splint R  hand       Assessment and Plan:    Assessment & Plan Chronic heart failure with preserved ejection fraction (HFpEF) (HCC) She was recently seen for symptoms of CHF. Lasix  and Farxiga  were started. EF was normal on echocardiogram with severe diastolic dysfunction. Symptoms have improved with current treatment. Volume status stable.  NYHA II-IIb. - Continue Farxiga  10 mg daily - Continue Lasix  20 mg twice a week - Continue Valsartan  HCTZ 160/12.5 mg daily - Monitor blood pressure; if consistently high, will consider adding spironolactone - Keep follow-up in January with Dr. Wonda as planned. Coronary artery disease involving native coronary artery of native heart without angina pectoris Status post CABG in March 2022. Recent MPI was neg for ischemia. No anginal symptoms reported.  She is not on antiplatelet therapy as she is on Eliquis . - Continue Imdur  30 mg daily - Continue rosuvastatin  20 mg daily Paroxysmal atrial fibrillation (HCC) Maintaining normal sinus rhythm. - Continue Eliquis  5 mg twice daily, Cardizem  SR 120 mg daily. Essential hypertension Blood pressure slightly elevated, possibly due to pain from broken hand.  - Continue Cardizem  SR 120 mg daily, Imdur  30 mg daily, valsartan /HCTZ 160/12.5 mg - Monitor blood pressure; if consistently high, will consider increasing isosorbide  or adding spironolactone Bilateral carotid artery stenosis Carotid artery disease with left CEA in May 2023. Recent carotid ultrasound shows patent left CEA. - Managed by vascular surgery.         Dispo:  Return in 7 weeks (on 05/05/2024) for Scheduled Follow Up, w/ Dr. Wonda.  Signed, Glendia Ferrier, PA-C

## 2024-03-15 NOTE — Assessment & Plan Note (Signed)
 She was recently seen for symptoms of CHF. Lasix  and Farxiga  were started. EF was normal on echocardiogram with severe diastolic dysfunction. Symptoms have improved with current treatment. Volume status stable.  NYHA II-IIb. - Continue Farxiga  10 mg daily - Continue Lasix  20 mg twice a week - Continue Valsartan  HCTZ 160/12.5 mg daily - Monitor blood pressure; if consistently high, will consider adding spironolactone - Keep follow-up in January with Dr. Wonda as planned.

## 2024-03-15 NOTE — Assessment & Plan Note (Signed)
 Status post CABG in March 2022. Recent MPI was neg for ischemia. No anginal symptoms reported.  She is not on antiplatelet therapy as she is on Eliquis . - Continue Imdur  30 mg daily - Continue rosuvastatin  20 mg daily

## 2024-03-17 ENCOUNTER — Ambulatory Visit: Attending: Physician Assistant | Admitting: Physician Assistant

## 2024-03-17 ENCOUNTER — Encounter: Payer: Self-pay | Admitting: Physician Assistant

## 2024-03-17 ENCOUNTER — Ambulatory Visit: Payer: Self-pay | Admitting: Cardiology

## 2024-03-17 VITALS — BP 140/60 | HR 60 | Ht 68.0 in | Wt 191.0 lb

## 2024-03-17 DIAGNOSIS — I2510 Atherosclerotic heart disease of native coronary artery without angina pectoris: Secondary | ICD-10-CM | POA: Diagnosis not present

## 2024-03-17 DIAGNOSIS — I5032 Chronic diastolic (congestive) heart failure: Secondary | ICD-10-CM | POA: Diagnosis not present

## 2024-03-17 DIAGNOSIS — I480 Paroxysmal atrial fibrillation: Secondary | ICD-10-CM | POA: Diagnosis not present

## 2024-03-17 DIAGNOSIS — I10 Essential (primary) hypertension: Secondary | ICD-10-CM | POA: Diagnosis not present

## 2024-03-17 DIAGNOSIS — I6523 Occlusion and stenosis of bilateral carotid arteries: Secondary | ICD-10-CM

## 2024-03-17 MED ORDER — VALSARTAN-HYDROCHLOROTHIAZIDE 160-12.5 MG PO TABS: 1.0000 | ORAL_TABLET | Freq: Every day | ORAL | 3 refills | Status: AC

## 2024-03-17 MED ORDER — DILTIAZEM HCL ER 120 MG PO CP12: 120.0000 mg | ORAL_CAPSULE | Freq: Every day | ORAL | 3 refills | Status: DC

## 2024-03-17 MED ORDER — APIXABAN 5 MG PO TABS: 5.0000 mg | ORAL_TABLET | Freq: Two times a day (BID) | ORAL | 3 refills | Status: DC

## 2024-03-17 MED ORDER — ROSUVASTATIN CALCIUM 20 MG PO TABS: 20.0000 mg | ORAL_TABLET | Freq: Every day | ORAL | 3 refills | Status: AC

## 2024-03-17 MED ORDER — FUROSEMIDE 20 MG PO TABS: ORAL_TABLET | ORAL | 3 refills | Status: AC

## 2024-03-17 MED ORDER — APIXABAN 5 MG PO TABS: 5.0000 mg | ORAL_TABLET | Freq: Two times a day (BID) | ORAL | 3 refills | Status: AC

## 2024-03-17 NOTE — Assessment & Plan Note (Signed)
 Maintaining normal sinus rhythm. - Continue Eliquis  5 mg twice daily, Cardizem  SR 120 mg daily.

## 2024-03-17 NOTE — Assessment & Plan Note (Signed)
 Carotid artery disease with left CEA in May 2023. Recent carotid ultrasound shows patent left CEA. - Managed by vascular surgery.

## 2024-03-17 NOTE — Progress Notes (Signed)
 EKG came to me, not sure why.  Thanks MJP

## 2024-03-17 NOTE — Patient Instructions (Signed)
 Medication Instructions:   Your physician recommends that you continue on your current medications as directed. Please refer to the Current Medication list given to you today.   *If you need a refill on your cardiac medications before your next appointment, please call your pharmacy*   Lab Work:  NONE ORDERED  TODAY     If you have labs (blood work) drawn today and your tests are completely normal, you will receive your results only by: MyChart Message (if you have MyChart) OR A paper copy in the mail If you have any lab test that is abnormal or we need to change your treatment, we will call you to review the results.    Testing/Procedures: NONE ORDERED  TODAY    Follow-Up: At [facility], you and your health needs are our priority.  As part of our continuing mission to provide you with exceptional heart care, our providers are all part of one team.  This team includes your primary Cardiologist (physician) and Advanced Practice Providers or APPs (Physician Assistants and Nurse Practitioners) who all work together to provide you with the care you need, when you need it.   Your next appointment:   AS SCHEDULED      We recommend signing up for the patient portal called "MyChart".  Sign up information is provided on this After Visit Summary.  MyChart is used to connect with patients for Virtual Visits (Telemedicine).  Patients are able to view lab/test results, encounter notes, upcoming appointments, etc.  Non-urgent messages can be sent to your provider as well.   To learn more about what you can do with MyChart, go to [email or web address].   Other Instructions

## 2024-05-05 ENCOUNTER — Ambulatory Visit: Attending: Cardiovascular Disease | Admitting: Cardiovascular Disease

## 2024-05-05 ENCOUNTER — Encounter: Payer: Self-pay | Admitting: Cardiovascular Disease

## 2024-05-05 VITALS — BP 124/64 | HR 106 | Ht 68.0 in | Wt 188.9 lb

## 2024-05-05 DIAGNOSIS — I2510 Atherosclerotic heart disease of native coronary artery without angina pectoris: Secondary | ICD-10-CM

## 2024-05-05 DIAGNOSIS — I10 Essential (primary) hypertension: Secondary | ICD-10-CM | POA: Diagnosis not present

## 2024-05-05 DIAGNOSIS — I480 Paroxysmal atrial fibrillation: Secondary | ICD-10-CM

## 2024-05-05 DIAGNOSIS — I6523 Occlusion and stenosis of bilateral carotid arteries: Secondary | ICD-10-CM

## 2024-05-05 DIAGNOSIS — Z01812 Encounter for preprocedural laboratory examination: Secondary | ICD-10-CM

## 2024-05-05 LAB — CBC

## 2024-05-05 MED ORDER — DILTIAZEM HCL ER COATED BEADS 240 MG PO CP24: 240.0000 mg | ORAL_CAPSULE | Freq: Every day | ORAL | 3 refills | Status: AC

## 2024-05-05 NOTE — Progress Notes (Signed)
 " Cardiology Office Note:    Date:  05/05/2024   ID:  [name], DOB 08-May-1950, MRN [id number]  PCP:  Silver Lamar LABOR, MD   [facility] Providers Cardiologist:  Ozell Fell, MD Electrophysiologist:  Soyla Gladis Norton, MD     Referring MD: Silver Lamar LABOR, MD   Chief Complaint  Patient presents with   Atrial Fibrillation    History of Present Illness:    Vanessa [name] is a 74 y.o. female with a hx of:  Paroxysmal atrial fibrillation  TTE 07/07/2019: EF 60-65, no RWMA, GLS -22.3, normal RVSF, trivial MR, AV sclerosis, RAP 3 IntraOp TEE 07/10/2020: EF 55-60 (HFpEF) heart failure with preserved ejection fraction  TTE 02/14/24: EF 50-55, Gr 3 DD, apical/septal/anterior AK, NL RVSF, severe LAE, trivial MR, AV sclerosis, mean AV 5 mmHg  Coronary artery disease  LHC 07/01/2020: Proximal LAD 80-90, proximal D1 80, proximal RI 90 OM1 70, RCA 95 S/p CABG, LAA clipping in 06/2020 SPECT MPI 02/10/2024 septal/apical infarct, no ischemia, EF 61, no TID, intermediate risk  Supraventricular Tachycardia  Monitor 09/17/2020: 40 runs SVT, 1 run NSVT Carotid artery disease S/p L CEA in 08/2021 Carotid US  07/09/2022: R ICA 1-39, L CEA patent Bradycardia  Hypertension  Hyperlipidemia   The patient is here alone today. Last week she noticed that her heart was beating fast and her BP device reported a heart rate of about 150 bpm. She has experienced increased fatigue and shortness of breath associated with the atrial fibrillation.  No chest pain or pressure.  No edema, orthopnea, or PND  Current Medications: Active Medications[1]   Allergies:   Codeine   ROS:   Please see the history of present illness.    All other systems reviewed and are negative.  EKGs/Labs/Other Studies Reviewed:    The following studies were reviewed today: Cardiac Studies & Procedures   ______________________________________________________________________________________________ CARDIAC  CATHETERIZATION  CARDIAC CATHETERIZATION 07/01/2020  Conclusion Conclusions: 1. Severe three-vessel coronary artery disease, including 80-90% proximal/mid LAD stenosis, 80% proximal D1 lesion, 95% proximal ramus stenosis, 70% large OM1 stenosis, and 95% disease involving small, codominant RCA. 2. Normal left ventricular contraction and filling pressure.  Recommendations: 1. Outpatient cardiac surgery consultation for CABG.  Add isosorbide  mononitrate 15 mg daily for antianginal therapy. 2. Aggressive secondary prevention of coronary artery disease; consider escalation of statin therapy as tolerated. 3. If no evidence of bleeding or vascular injury at catheterization site, apixaban  5 mg twice daily should be restarted tomorrow morning.  Add aspirin  81 mg daily pending surgical consultation.  Lonni Hanson, MD [facility]  Findings Coronary Findings Diagnostic  Dominance: Co-dominant  Left Main Vessel is large. Vessel is angiographically normal.  Left Anterior Descending Prox LAD to Mid LAD lesion is 85% stenosed.  First Diagonal Branch Vessel is large in size. 1st Diag lesion is 80% stenosed.  Ramus Intermedius Vessel is moderate in size. Ramus lesion is 95% stenosed.  Left Circumflex Vessel is large. Mid Cx lesion is 40% stenosed.  First Obtuse Marginal Branch Vessel is large in size. 1st Mrg lesion is 70% stenosed.  Second Obtuse Marginal Branch Vessel is moderate in size.  First Left Posterolateral Branch Vessel is small in size.  Second Left Posterolateral Branch Vessel is small in size.  Right Coronary Artery Vessel is small. Prox RCA lesion is 95% stenosed.  Right Posterior Descending Artery Vessel is small in size.  Intervention  No interventions have been documented.   STRESS TESTS  MYOCARDIAL PERFUSION  IMAGING 02/10/2024  Interpretation Summary   Findings are consistent with medium size area of septal/apical infarction. The study is  intermediate risk.   No ST deviation was noted.   LV perfusion is abnormal. There is no evidence of ischemia. There is evidence of infarction. Defect 1: There is a medium defect with moderate reduction in uptake present in the apical to mid anteroseptal and apex location(s) that is fixed. There is abnormal wall motion in the defect area. Consistent with infarction.   Left ventricular function is normal. Nuclear stress EF: 61%. The left ventricular ejection fraction is normal (55-65%). End diastolic cavity size is normal. End systolic cavity size is normal. No evidence of transient ischemic dilation (TID) noted.   CT images were obtained for attenuation correction and were examined for the presence of coronary calcium  when appropriate.   Coronary calcium  assessment not performed due to prior revascularization.   Prior study available for comparison from 12/15/2011. There are changes compared to prior study which appear to be new.   ECHOCARDIOGRAM  ECHOCARDIOGRAM COMPLETE 02/14/2024  Narrative ECHOCARDIOGRAM REPORT    Patient Name:   Vanessa [name] Date of Exam: 02/14/2024 Medical Rec #:  [id number]      Height:       68.0 in Accession #:    [id number]     Weight:       194.0 lb Date of Birth:  1950-07-28     BSA:          2.017 m Patient Age:    72 years       BP:           180/76 mmHg Patient Gender: F              HR:           68 bpm. Exam Location:  [address]  Procedure: 2D Echo, Cardiac Doppler, Color Doppler and Intracardiac Opacification Agent (Both Spectral and Color Flow Doppler were utilized during procedure).  Indications:    R06.00 Dyspnea R07.9 Chest Pain  History:        Patient has prior history of Echocardiogram examinations, most recent 07/07/2019. Risk Factors:Hypertension and Dyslipidemia.  Sonographer:    Powell Saras Referring Phys: [id number] SCOTT T WEAVER  IMPRESSIONS   1. Left ventricular ejection fraction, by estimation, is 50 to 55%. The left ventricle  has low normal function. The left ventricle demonstrates regional wall motion abnormalities (see scoring diagram/findings for description). Left ventricular diastolic parameters are consistent with Grade III diastolic dysfunction (restrictive). Elevated left atrial pressure. There is akinesis of the left ventricular, apical apical segment, septal wall and anterior wall. 2. Right ventricular systolic function is normal. The right ventricular size is normal. Tricuspid regurgitation signal is inadequate for assessing PA pressure. 3. Left atrial size was severely dilated. 4. The mitral valve is normal in structure. Trivial mitral valve regurgitation. No evidence of mitral stenosis. 5. The aortic valve is tricuspid. Aortic valve regurgitation is not visualized. Aortic valve sclerosis/calcification is present, without any evidence of aortic stenosis. Aortic valve area, by VTI measures 2.15 cm. Aortic valve mean gradient measures 5.0 mmHg. Aortic valve Vmax measures 1.47 m/s. 6. The inferior vena cava is normal in size with greater than 50% respiratory variability, suggesting right atrial pressure of 3 mmHg.  FINDINGS Left Ventricle: Left ventricular ejection fraction, by estimation, is 50 to 55%. The left ventricle has low normal function. The left ventricle demonstrates regional wall motion abnormalities. Definity  contrast agent was given  IV to delineate the left ventricular endocardial borders. The left ventricular internal cavity size was normal in size. There is no left ventricular hypertrophy. Left ventricular diastolic parameters are consistent with Grade III diastolic dysfunction (restrictive). Elevated left atrial pressure.  Right Ventricle: The right ventricular size is normal. No increase in right ventricular wall thickness. Right ventricular systolic function is normal. Tricuspid regurgitation signal is inadequate for assessing PA pressure.  Left Atrium: Left atrial size was severely  dilated.  Right Atrium: Right atrial size was normal in size.  Pericardium: There is no evidence of pericardial effusion.  Mitral Valve: The mitral valve is normal in structure. Mild mitral annular calcification. Trivial mitral valve regurgitation. No evidence of mitral valve stenosis.  Tricuspid Valve: The tricuspid valve is normal in structure. Tricuspid valve regurgitation is trivial. No evidence of tricuspid stenosis.  Aortic Valve: The aortic valve is tricuspid. Aortic valve regurgitation is not visualized. Aortic valve sclerosis/calcification is present, without any evidence of aortic stenosis. Aortic valve mean gradient measures 5.0 mmHg. Aortic valve peak gradient measures 8.6 mmHg. Aortic valve area, by VTI measures 2.15 cm.  Pulmonic Valve: The pulmonic valve was normal in structure. Pulmonic valve regurgitation is not visualized. No evidence of pulmonic stenosis.  Aorta: The aortic root is normal in size and structure.  Venous: The inferior vena cava is normal in size with greater than 50% respiratory variability, suggesting right atrial pressure of 3 mmHg.  IAS/Shunts: No atrial level shunt detected by color flow Doppler.   LEFT VENTRICLE PLAX 2D LVIDd:         5.30 cm      Diastology LVIDs:         3.70 cm      LV e' medial:    6.56 cm/s LV PW:         0.90 cm      LV E/e' medial:  11.5 LV IVS:        0.90 cm      LV e' lateral:   9.56 cm/s LVOT diam:     2.10 cm      LV E/e' lateral: 7.9 LV SV:         69 LV SV Index:   34 LVOT Area:     3.46 cm  LV Volumes (MOD) LV vol d, MOD A2C: 127.0 ml LV vol d, MOD A4C: 126.0 ml LV vol s, MOD A2C: 55.8 ml LV vol s, MOD A4C: 54.2 ml LV SV MOD A2C:     71.2 ml LV SV MOD A4C:     126.0 ml LV SV MOD BP:      74.5 ml  RIGHT VENTRICLE             IVC RV Basal diam:  3.20 cm     IVC diam: 1.20 cm RV S prime:     10.90 cm/s TAPSE (M-mode): 1.9 cm  LEFT ATRIUM              Index        RIGHT ATRIUM           Index LA diam:         4.70 cm  2.33 cm/m   RA Area:     18.00 cm LA Vol (A2C):   97.0 ml  48.08 ml/m  RA Volume:   49.00 ml  24.29 ml/m LA Vol (A4C):   99.5 ml  49.32 ml/m LA Biplane Vol: 100.0 ml 49.57 ml/m AORTIC VALVE AV Area (  Vmax):    2.16 cm AV Area (Vmean):   2.04 cm AV Area (VTI):     2.15 cm AV Vmax:           147.00 cm/s AV Vmean:          104.000 cm/s AV VTI:            0.320 m AV Peak Grad:      8.6 mmHg AV Mean Grad:      5.0 mmHg LVOT Vmax:         91.80 cm/s LVOT Vmean:        61.200 cm/s LVOT VTI:          0.199 m LVOT/AV VTI ratio: 0.62  AORTA Ao Root diam: 3.10 cm Ao Asc diam:  3.30 cm  MITRAL VALVE MV Area (PHT): 3.01 cm    SHUNTS MV Decel Time: 252 msec    Systemic VTI:  0.20 m MV E velocity: 75.40 cm/s  Systemic Diam: 2.10 cm MV A velocity: 33.40 cm/s MV E/A ratio:  2.26  Wilbert Bihari MD Electronically signed by Wilbert Bihari MD Signature Date/Time: 02/15/2024/8:46:36 AM    Final   TEE  ECHO INTRAOPERATIVE TEE 07/10/2020  Narrative *INTRAOPERATIVE TRANSESOPHAGEAL REPORT *    Patient Name:   SOLENE HEREFORD   Date of Exam: 07/10/2020 Medical Rec #:  [id number]      Height:       68.0 in Accession #:    [id number]     Weight:       181.0 lb Date of Birth:  [birth year]     BSA:          1.96 m Patient Age:    69 years       BP:           144/48 mmHg Patient Gender: F              HR:           51 bpm. Exam Location:  Anesthesiology  Transesophogeal exam was perform intraoperatively during surgical procedure. Patient was closely monitored under general anesthesia during the entirety of examination.  Indications:     CAD Native Vessel Sonographer:     Augustin Seals RDCS (AE) Performing Phys: [id number] ELSPETH BROCKS HENDRICKSON Diagnosing Phys: Norleen Pope MD  Complications: No known complications during this procedure. POST-OP IMPRESSIONS - Left Ventricle: The left ventricle is unchanged from pre-bypass. - Right Ventricle: The right ventricle appears  unchanged from pre-bypass. - Aorta: The aorta appears unchanged from pre-bypass. - Left Atrium: The left atrium appears unchanged from pre-bypass. - Left Atrial Appendage: Has been surgically closed. - Aortic Valve: The aortic valve appears unchanged from pre-bypass. - Mitral Valve: The mitral valve appears unchanged from pre-bypass. - Tricuspid Valve: The tricuspid valve appears unchanged from pre-bypass. - Pulmonic Valve: The pulmonic valve appears unchanged from pre-bypass. - Interatrial Septum: The interatrial septum appears unchanged from pre-bypass. - Interventricular Septum: The interventricular septum appears unchanged from pre-bypass. - Pericardium: The pericardium appears unchanged from pre-bypass.  PRE-OP FINDINGS Left Ventricle: The left ventricle has normal systolic function, with an ejection fraction of 55-60%. The cavity size was normal. There is concentric left ventricular hypertrophy. There is mild concentric left ventricular hypertrophy.   Right Ventricle: The right ventricle has normal systolic function. The cavity was normal. There is no increase in right ventricular wall thickness.  Left Atrium: Left atrial size was dilated. No left atrial/left atrial appendage thrombus was detected.  Right  Atrium: Right atrial size was normal in size.  Interatrial Septum: No atrial level shunt detected by color flow Doppler. There is no evidence of a patent foramen ovale.  Pericardium: Trivial pericardial effusion is present.  Mitral Valve: The mitral valve is normal in structure. Mitral valve regurgitation is mild by color flow Doppler. There is no evidence of mitral valve vegetation.  Tricuspid Valve: The tricuspid valve was normal in structure. Tricuspid valve regurgitation is mild by color flow Doppler. There is no evidence of tricuspid valve vegetation.  Aortic Valve: The aortic valve is tricuspid Aortic valve regurgitation was not visualized by color flow Doppler. There is  no stenosis of the aortic valve. There is no evidence of aortic valve vegetation.   Pulmonic Valve: The pulmonic valve was normal in structure. Pulmonic valve regurgitation is trivial by color flow Doppler.   Aorta: There is evidence of plaque in the descending aorta; Grade III, measuring 3-95mm in size.  Shunts: There is no evidence of an atrial septal defect.  +-------------+------------++ AORTIC VALVE              +-------------+------------++ AV Vmax:     150.50 cm/s  +-------------+------------++ AV Vmean:    102.300 cm/s +-------------+------------++ AV VTI:      0.441 m      +-------------+------------++ AV Peak Grad:9.1 mmHg     +-------------+------------++ AV Mean Grad:5.0 mmHg     +-------------+------------++   Norleen Pope MD Electronically signed by Norleen Pope MD Signature Date/Time: 07/10/2020/3:53:43 PM    Final  MONITORS  LONG TERM MONITOR (3-14 DAYS) 04/16/2023  Narrative Patch Wear Time:  7 days and 0 hours (2024-12-23T14:36:24-0500 to 2024-12-30T15:00:03-499)  Patient had a min HR of 45 bpm, max HR of 194 bpm, and avg HR of 60 bpm. Predominant underlying rhythm was Sinus Rhythm. 33 Supraventricular Tachycardia runs occurred, the run with the fastest interval lasting 6 beats with a max rate of 194 bpm, the longest lasting 11 beats with an avg rate of 109 bpm. Idioventricular Rhythm was present. Isolated SVEs were occasional (2.9%, 16160), SVE Couplets were rare (<1.0%, 527), and SVE Triplets were rare (<1.0%, 58). Isolated VEs were rare (<1.0%), VE Couplets were rare (<1.0%), and no VE Triplets were present. Ventricular Trigeminy was present.  SUMMARY: the basic rhythm is normal sinus with an average HR of 60 bpm. There is no atrial fibrillation or flutter. No bradycardic events or high-grade AV block. No sustained arrhythmias. Otherwise, as detailed above with low arrhythmia burden (2.9% SVE's and <1% VE's).   CT  SCANS  CT CORONARY FRACTIONAL FLOW RESERVE DATA PREP 06/23/2020  Narrative EXAM: FFRCT ANALYSIS  FINDINGS: FFRct analysis was performed on the original cardiac CT angiogram dataset. Diagrammatic representation of the FFRct analysis is provided in a separate PDF document in PACS. This dictation was created using the PDF document and an interactive 3D model of the results. 3D model is not available in the EMR/PACS. Normal FFR range is >0.80.  1. Left Main: No significant stenosis. LM FFR = 0.99.  2. LAD: Possible stenosis in mid LAD and diagonal. Proximal FFR = 0.97, Mid FFR = 0.79, Distal FFR could not be assessed. Diagonal: Proximal FFR = 0.96, Mid FFR = 0.62, distal FFR = 0.59.  3. LCX: Possible significant stenosis mid LCX/OM. Proximal FFR = 0.98, Mid FFR = 0.69, Distal FFR= 0.61. OM1: Proximal FFR = 0.96, Mid FFR = 0.74, Distal FFR = 0.69.  4. Ramus:Occluded proximally  5. RCA: Possible significant stenosis in mid  RCA. Proximal FFR = 0.99, Mid FFR = 0.57. Distal FFR could not be assessed.  IMPRESSION: 1. Coronary CTA FFR flow analysis demonstrates possible flow limiting lesions in the mid LAD (0.97>0.79), mid Diagonal (0.96>0.62), mid LCx (0.98>0.69), mid OM1 (0.96>0.69) and mid RCA (0.99>0.57) and occluded Ramus.  2.  Recommend cardiac catheterization.  3.  Findings discussed with Dr. Inocencio.  Wilbert Turner   Electronically Signed By: Wilbert Bihari On: 06/25/2020 17:18   CT SCANS  CT CORONARY MORPH W/CTA COR W/SCORE 06/21/2020  Addendum 06/23/2020 10:52 PM ADDENDUM REPORT: 06/23/2020 22:50  EXAM: Cardiac/Coronary  CT  TECHNIQUE: The patient was scanned on a [employer].  FINDINGS: A 120 kV prospective scan was triggered in the descending thoracic aorta at 111 HU's. Axial non-contrast 3 mm slices were carried out through the heart. The data set was analyzed on a dedicated work station and scored using the Agatson method. Gantry rotation  speed was 250 msecs and collimation was .6 mm. No beta blockade and 0.8 mg of sl NTG was given. The 3D data set was reconstructed in 5% intervals of the 67-82 % of the R-R cycle. Diastolic phases were analyzed on a dedicated work station using MPR, MIP and VRT modes. The patient received 80 cc of contrast.  Aorta:  Normal size.  Scattered calcifications.  No dissection.  Aortic Valve:  Trileaflet.  No calcifications.  Coronary Arteries:  Normal coronary origin.  Right dominance.  RCA is a large dominant artery that gives rise to PDA and PLVB. 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 There is motion artifact in the RCA which limits accurate assessment. There is at least mild calcified plaque in the proximal RCA with associated stenosis of 25-49%. Distal to this lesion there is an ill defined area that may represent non calcified plaque but cannot accurately quantitate stenosis.  Left main is a large artery that gives rise to LAD, Ramus and LCX arteries. There is minimal calcified plaque in the ostial LM with associated stenosis of 0-24%.  LAD is a large vessel that gives rise to a large D1. The LAD is diffusely diseased throughout the proximal and mid portions. There is mild calcified plaque in the proximal LAD with associated stenosis of 25-49%. The calcified plaque extends into the proximal D1 with associated stenosis of 25-49%. There is severe non calcified plaque in the proximal portion of D1 with associated stenosis of 70-99%. There is moderate calcified plaque in the mid LAD with associated stenosis of 50-69%.  The Ramus is a moderate sized vessel. There is a non calcified plaque in the proximal ramus that may be severe with associated stenosis of 70-99% but this is not well visualized. There is mild calcified plaque in the mid to distal ramus with associated stenosis of 25-49%.  LCX is a non-dominant artery that gives rise to one large  OM1 branch. There is mild calcified plaque in the proximal OM1 and mild non calcified plaque with associated stenosis of 25-49%. There is mixed plaque in the mid OM1 that is difficult to define but may be as much as 50-69% stenosis or more. There is moderate non calcified plaque in the mid LCx after the takeoff of OM1 with associated stenosis of 50-69%.  Other findings:  Normal pulmonary vein drainage into the left atrium.  Normal let atrial appendage without a thrombus.  Normal size of the pulmonary artery.  IMPRESSION: 1. Coronary calcium  score of 339. This was  87th percentile for age and sex matched control.  2.  Normal coronary origin with right dominance.  3.  Moderate atherosclerosis.  CAD RADS 3.  4. Consider symptom-guided anti-ischemic and preventive pharmacotherapy as well as risk factor modification per guideline-directed care.  5.  Consider cardiac catheterization.  6.  This study has been submitted for FFR analysis.  Wilbert Bihari   Electronically Signed By: Wilbert Bihari On: 06/23/2020 22:50  Narrative EXAM: OVER-READ INTERPRETATION  CT CHEST  The following report is an over-read performed by radiologist Dr. Toribio Aye of [facility] on 06/21/2020. This over-read does not include interpretation of cardiac or coronary anatomy or pathology. The coronary calcium  score/coronary CTA interpretation by the cardiologist is attached.  COMPARISON:  None.  FINDINGS: Atherosclerotic calcifications in the thoracic aorta. Within the visualized portions of the thorax there are no suspicious appearing pulmonary nodules or masses, there is no acute consolidative airspace disease, no pleural effusions, no pneumothorax and no lymphadenopathy. Visualized portions of the upper abdomen demonstrate an incompletely imaged low-attenuation lesion in the left lobe of the liver, not characterized on today's examination, but statistically likely to  represent a cyst. There are no aggressive appearing lytic or blastic lesions noted in the visualized portions of the skeleton.  IMPRESSION: 1.  Aortic Atherosclerosis (ICD10-I70.0).  Electronically Signed: By: Toribio Aye M.D. On: 06/21/2020 09:12   CT SCANS  CT CARDIAC SCORING (SELF PAY ONLY) 10/13/2017  Addendum 10/13/2017  5:22 PM ADDENDUM REPORT: 10/13/2017 17:19  EXAM: OVER-READ INTERPRETATION  CT CHEST  The following report is an over-read performed by radiologist Dr. Franky Leff [facility] on 10/13/2017. This over-read does not include interpretation of cardiac or coronary anatomy or pathology. The coronary calcium  score interpretation by the cardiologist is attached.  COMPARISON:  None.  FINDINGS: Scattered aortic calcifications. Aorta is normal caliber. Heart is normal size. No adenopathy in the lower mediastinum or hila. Visualized lungs clear. No effusions.  Imaging into the upper abdomen shows no acute findings. Chest wall soft tissues are unremarkable. No acute bony abnormality.  IMPRESSION: No acute or significant extracardiac abnormality.   Electronically Signed By: Franky Crease M.D. On: 10/13/2017 17:19  Narrative CLINICAL DATA:  Risk stratification  EXAM: Coronary Calcium  Score  TECHNIQUE: The patient was scanned on a [employer]. Axial non-contrast 3 mm slices were carried out through the heart. The data set was analyzed on a dedicated work station and scored using the Agatson method.  MEDICATIONS: None.  FINDINGS: Non-cardiac: See separate report from [facility].  Ascending Aorta: Normal size, trivial calcifications in the ascending and moderate calcifications in the descending aorta.  Pericardium: Normal.  Coronary arteries: Normal origin.  IMPRESSION: Coronary calcium  score of 192. This was 81 percentile for age and sex matched control.  Electronically Signed: By: Leim Moose On:  10/13/2017 16:02     ______________________________________________________________________________________________      EKG:   EKG Interpretation Date/Time:  Friday May 05 2024 09:20:09 EST Ventricular Rate:  106 PR Interval:    QRS Duration:  106 QT Interval:  368 QTC Calculation: 488 R Axis:   -43  Text Interpretation: Atrial fibrillation with rapid ventricular response Left axis deviation Incomplete right bundle branch block Minimal voltage criteria for LVH, may be normal variant ( [employer] product ) When compared with ECG of 17-Mar-2024 10:08, Atrial fibrillation has replaced Sinus rhythm Vent. rate has increased BY  46 BPM Nonspecific T wave abnormality, improved in Anterior leads Confirmed by Wonda,  Ozell [phone]) on 05/05/2024 9:29:50 AM    Recent Labs: 02/02/2024: Hemoglobin 11.9; NT-Pro BNP 918; Platelets 198 03/02/2024: BUN 29; Creatinine, Ser 1.20; Potassium 4.0; Sodium 139  Recent Lipid Panel    Component Value Date/Time   CHOL 136 02/02/2023 1607   TRIG 89 02/02/2023 1607   HDL 57 02/02/2023 1607   CHOLHDL 2.4 02/02/2023 1607   LDLCALC 62 02/02/2023 1607     Risk Assessment/Calculations:    CHA2DS2-VASc Score = 4   This indicates a 4.8% annual risk of stroke. The patient's score is based upon: CHF History: 0 HTN History: 1 Diabetes History: 0 Stroke History: 0 Vascular Disease History: 1 Age Score: 1 Gender Score: 1               Physical Exam:    VS:  BP 124/64 (BP Location: Left Arm, Patient Position: Sitting, Cuff Size: Normal)   Pulse (!) 106   Ht 5' 8 (1.727 m)   Wt 188 lb 14.4 oz (85.7 kg)   SpO2 97%   BMI 28.72 kg/m     Wt Readings from Last 3 Encounters:  05/05/24 188 lb 14.4 oz (85.7 kg)  03/17/24 191 lb (86.6 kg)  02/02/24 194 lb (88 kg)     GEN:  Well nourished, well developed in no acute distress HEENT: Normal NECK: No JVD; No carotid bruits LYMPHATICS: No lymphadenopathy CARDIAC: Irregularly irregular, no murmurs,  rubs, gallops RESPIRATORY:  Clear to auscultation without rales, wheezing or rhonchi  ABDOMEN: Soft, non-tender, non-distended MUSCULOSKELETAL:  No edema; No deformity  SKIN: Warm and dry NEUROLOGIC:  Alert and oriented x 3 PSYCHIATRIC:  Normal affect   Assessment & Plan Coronary artery disease involving native coronary artery of native heart without angina pectoris S/P CABG 2022. No antiplatelet Rx because of chronic oral anticoagulation. Treated with isosorbide  and rosuvastatin . Paroxysmal atrial fibrillation (HCC) The patient is back in atrial fibrillation today.  I reviewed her past EKGs today and she has been in sinus rhythm and all EKGs over the past several years.  The patient was seen by Dr. Inocencio in 2022 and was felt to be stable on current medications, maintaining sinus rhythm at that time.  She underwent left atrial appendage clipping at the time of her CABG in 2022. Symptoms have been present now for about one week. She has been compliant with apixaban . Reviewed labs and TSH/fT4/total T3 all normal.  Since she has been maintaining sinus rhythm and is now symptomatic with atrial fibrillation over the past week, I recommended that we proceed with elective cardioversion.  The patient has been compliant with apixaban  with no missed doses over the past 3 weeks.  She understands the importance of continuing this as prescribed.  Will arrange cardioversion.  Full informed consent is obtained (see below).  Will arrange follow-up in the atrial fibrillation clinic.  I think she is at relatively high risk of recurrence with severe atrial enlargement on echo.  She may ultimately require antiarrhythmic drug therapy and we discussed this today.  Patient is status post left atrial appendage clipping. Essential hypertension Treated with diltiazem , isosorbide , and valsartan /hydrochlorothiazide .  Increase diltiazem  to 240 mg daily for better rate control. Bilateral carotid artery stenosis Hx of left CEA  2023. Managed by vascular surgery. Pre-procedure lab exam        Informed Consent   Shared Decision Making/Informed Consent The risks (stroke, cardiac arrhythmias rarely resulting in the need for a temporary or permanent pacemaker, skin irritation or burns and complications  associated with conscious sedation including aspiration, arrhythmia, respiratory failure and death), benefits (restoration of normal sinus rhythm) and alternatives of a direct current cardioversion were explained in detail to Ms. Oelke and she agrees to proceed.         Medication Adjustments/Labs and Tests Ordered: Current medicines are reviewed at length with the patient today.  Concerns regarding medicines are outlined above.  Orders Placed This Encounter  Procedures   Basic metabolic panel with GFR   CBC   Amb Referral to AFIB Clinic   EKG 12-Lead   Meds ordered this encounter  Medications   diltiazem  (CARDIZEM  CD) 240 MG 24 hr capsule    Sig: Take 1 capsule (240 mg total) by mouth daily.    Dispense:  30 capsule    Refill:  3    Increased to 240 mg    Patient Instructions  Medication Instructions:  INCREASE Diltiazem  to 240 mg once daily  *If you need a refill on your cardiac medications before your next appointment, please call your pharmacy*  Lab Work: To be completed today: BMP and CBC  If you have labs (blood work) drawn today and your tests are completely normal, you will receive your results only by: MyChart Message (if you have MyChart) OR A paper copy in the mail If you have any lab test that is abnormal or we need to change your treatment, we will call you to review the results.  Testing/Procedures: Your physician has recommended that you have a Cardioversion (DCCV). Electrical Cardioversion uses a jolt of electricity to your heart either through paddles or wired patches attached to your chest. This is a controlled, usually prescheduled, procedure. Defibrillation is done under light  anesthesia in the hospital, and you usually go home the day of the procedure. This is done to get your heart back into a normal rhythm. You are not awake for the procedure. Please see the instruction sheet given to you today.    Dear Rock PARAS Waskey  You are scheduled for a Cardioversion on Thursday, November 29 with Dr. Madonna Large.  Please arrive at the [facility] (Main Entrance A) at [facility]: [address] [place], [place] [address] at 11:00 AM (This time is 1 hour(s) before your procedure to ensure your preparation).   Free valet parking service is available. You will check in at ADMITTING.   *Please Note: You will receive a call the day before your procedure to confirm the appointment time. That time may have changed from the original time based on the schedule for that day.*   DIET:  Nothing to eat or drink after midnight except a sip of water  with medications (see medication instructions below)  MEDICATION INSTRUCTIONS: !!IF ANY NEW MEDICATIONS ARE STARTED AFTER TODAY, PLEASE NOTIFY YOUR PROVIDER AS SOON AS POSSIBLE!!  FYI: Medications such as Semaglutide (Ozempic, Wegovy), Tirzepatide (Mounjaro, Zepbound), Dulaglutide (Trulicity), etc (GLP1 agonists) AND Canagliflozin (Invokana), Dapagliflozin  (Farxiga ), Empagliflozin (Jardiance), Ertugliflozin (Steglatro), Bexagliflozin [employer]) or any combination with one of these drugs such as Invokamet (Canagliflozin/Metformin), Synjardy (Empagliflozin/Metformin), etc (SGLT2 inhibitors) must be held around the time of a procedure. This is not a comprehensive list of all of these drugs. Please review all of your medications and talk to your provider if you take any one of these. If you are not sure, ask your provider.            HOLD: Dapagliflozin  (Farxiga ) for 3 days prior to the procedure. Last dose on Sunday, January 25.  HOLD: Lasix  and Diovan  the morning of your procedure (05/11/2024)          VERY IMPORTANT:  Continue taking your anticoagulant (blood thinner): Apixaban  (Eliquis ).  You will need to continue this after your procedure until you are told by your provider that it is safe to stop.    LABS:  Done 05/05/24   FYI:  For your safety, and to allow us  to monitor your vital signs accurately during the surgery/procedure we request: If you have artificial nails, gel coating, SNS etc, please have those removed prior to your surgery/procedure. Not having the nail coverings /polish removed may result in cancellation or delay of your surgery/procedure.  Your support person will be asked to wait in the waiting room during your procedure.  It is OK to have someone drop you off and come back when you are ready to be discharged.  You cannot drive after the procedure and will need someone to drive you home.  Bring your insurance cards.  *Special Note: Every effort is made to have your procedure done on time. Occasionally there are emergencies that occur at the hospital that may cause delays. Please be patient if a delay does occur.      Follow-Up: At [facility], you and your health needs are our priority.  As part of our continuing mission to provide you with exceptional heart care, our providers are all part of one team.  This team includes your primary Cardiologist (physician) and Advanced Practice Providers or APPs (Physician Assistants and Nurse Practitioners) who all work together to provide you with the care you need, when you need it.  You have been referred to our atrial fibrillation clinic. Our office will call you to schedule this appointment.   Your next appointment:   3 month(s)  Provider:   Glendia Ferrier, PA-C      Signed, Ozell Fell, MD  05/05/2024 1:20 PM    [facility]     [1]  Current Meds  Medication Sig   apixaban  (ELIQUIS ) 5 MG TABS tablet Take 1 tablet (5 mg total) by mouth 2 (two) times daily.   clotrimazole-betamethasone (LOTRISONE) cream  Apply 1 application. topically 2 (two) times daily as needed (irritation).   Coenzyme Q10 100 MG capsule Take 100 mg by mouth daily.   dapagliflozin  propanediol (FARXIGA ) 10 MG TABS tablet Take 1 tablet (10 mg total) by mouth daily.   diltiazem  (CARDIZEM  CD) 240 MG 24 hr capsule Take 1 capsule (240 mg total) by mouth daily.   esomeprazole  (NEXIUM ) 40 MG capsule Take 40 mg by mouth daily at 12 noon.   furosemide  (LASIX ) 20 MG tablet Take 1 tablet (20 mg) twice a week. Take 1 tab extra if weight increases 3 lbs or more in 1 day.   isosorbide  mononitrate (IMDUR ) 30 MG 24 hr tablet Take 1 tablet (30 mg total) by mouth daily.   Multiple Vitamins-Minerals (ALIVE ONCE DAILY WOMENS 50+) TABS Take 1 tablet by mouth daily.   nitroGLYCERIN  (NITROSTAT ) 0.4 MG SL tablet Place 1 tablet (0.4 mg total) under the tongue every 5 (five) minutes as needed for chest pain.   Polyethylene Glycol 400 (BLINK TEARS) 0.25 % SOLN Place 1 drop into both eyes daily as needed (Dry eyes).   Probiotic Product (PROBIOTIC DAILY PO) Take by mouth. Taking over the counter Floragen   rosuvastatin  (CRESTOR ) 20 MG tablet Take 1 tablet (20 mg total) by mouth daily.   valsartan -hydrochlorothiazide  (DIOVAN  HCT) 160-12.5 MG tablet Take  1 tablet by mouth daily.   [DISCONTINUED] diltiazem  (CARDIZEM  SR) 120 MG 12 hr capsule Take 1 capsule (120 mg total) by mouth daily.   "

## 2024-05-05 NOTE — H&P (View-Only) (Signed)
 " Cardiology Office Note:    Date:  05/05/2024   ID:  Vanessa [name], DOB December 15, 1950, MRN [id number]  PCP:  Silver Lamar LABOR, MD   [facility] Providers Cardiologist:  Ozell Fell, MD Electrophysiologist:  Soyla Gladis Norton, MD     Referring MD: Silver Lamar LABOR, MD   Chief Complaint  Patient presents with   Atrial Fibrillation    History of Present Illness:    Vanessa [name] is a 74 y.o. female with a hx of:  Paroxysmal atrial fibrillation  TTE 07/07/2019: EF 60-65, no RWMA, GLS -22.3, normal RVSF, trivial MR, AV sclerosis, RAP 3 IntraOp TEE 07/10/2020: EF 55-60 (HFpEF) heart failure with preserved ejection fraction  TTE 02/14/24: EF 50-55, Gr 3 DD, apical/septal/anterior AK, NL RVSF, severe LAE, trivial MR, AV sclerosis, mean AV 5 mmHg  Coronary artery disease  LHC 07/01/2020: Proximal LAD 80-90, proximal D1 80, proximal RI 90 OM1 70, RCA 95 S/p CABG, LAA clipping in 06/2020 SPECT MPI 02/10/2024 septal/apical infarct, no ischemia, EF 61, no TID, intermediate risk  Supraventricular Tachycardia  Monitor 09/17/2020: 40 runs SVT, 1 run NSVT Carotid artery disease S/p L CEA in 08/2021 Carotid US  07/09/2022: R ICA 1-39, L CEA patent Bradycardia  Hypertension  Hyperlipidemia   The patient is here alone today. Last week she noticed that her heart was beating fast and her BP device reported a heart rate of about 150 bpm. She has experienced increased fatigue and shortness of breath associated with the atrial fibrillation.  No chest pain or pressure.  No edema, orthopnea, or PND  Current Medications: Active Medications[1]   Allergies:   Codeine   ROS:   Please see the history of present illness.    All other systems reviewed and are negative.  EKGs/Labs/Other Studies Reviewed:    The following studies were reviewed today: Cardiac Studies & Procedures   ______________________________________________________________________________________________ CARDIAC  CATHETERIZATION  CARDIAC CATHETERIZATION 07/01/2020  Conclusion Conclusions: 1. Severe three-vessel coronary artery disease, including 80-90% proximal/mid LAD stenosis, 80% proximal D1 lesion, 95% proximal ramus stenosis, 70% large OM1 stenosis, and 95% disease involving small, codominant RCA. 2. Normal left ventricular contraction and filling pressure.  Recommendations: 1. Outpatient cardiac surgery consultation for CABG.  Add isosorbide  mononitrate 15 mg daily for antianginal therapy. 2. Aggressive secondary prevention of coronary artery disease; consider escalation of statin therapy as tolerated. 3. If no evidence of bleeding or vascular injury at catheterization site, apixaban  5 mg twice daily should be restarted tomorrow morning.  Add aspirin  81 mg daily pending surgical consultation.  Lonni Hanson, MD [facility]  Findings Coronary Findings Diagnostic  Dominance: Co-dominant  Left Main Vessel is large. Vessel is angiographically normal.  Left Anterior Descending Prox LAD to Mid LAD lesion is 85% stenosed.  First Diagonal Branch Vessel is large in size. 1st Diag lesion is 80% stenosed.  Ramus Intermedius Vessel is moderate in size. Ramus lesion is 95% stenosed.  Left Circumflex Vessel is large. Mid Cx lesion is 40% stenosed.  First Obtuse Marginal Branch Vessel is large in size. 1st Mrg lesion is 70% stenosed.  Second Obtuse Marginal Branch Vessel is moderate in size.  First Left Posterolateral Branch Vessel is small in size.  Second Left Posterolateral Branch Vessel is small in size.  Right Coronary Artery Vessel is small. Prox RCA lesion is 95% stenosed.  Right Posterior Descending Artery Vessel is small in size.  Intervention  No interventions have been documented.   STRESS TESTS  MYOCARDIAL PERFUSION  IMAGING 02/10/2024  Interpretation Summary   Findings are consistent with medium size area of septal/apical infarction. The study is  intermediate risk.   No ST deviation was noted.   LV perfusion is abnormal. There is no evidence of ischemia. There is evidence of infarction. Defect 1: There is a medium defect with moderate reduction in uptake present in the apical to mid anteroseptal and apex location(s) that is fixed. There is abnormal wall motion in the defect area. Consistent with infarction.   Left ventricular function is normal. Nuclear stress EF: 61%. The left ventricular ejection fraction is normal (55-65%). End diastolic cavity size is normal. End systolic cavity size is normal. No evidence of transient ischemic dilation (TID) noted.   CT images were obtained for attenuation correction and were examined for the presence of coronary calcium  when appropriate.   Coronary calcium  assessment not performed due to prior revascularization.   Prior study available for comparison from 12/15/2011. There are changes compared to prior study which appear to be new.   ECHOCARDIOGRAM  ECHOCARDIOGRAM COMPLETE 02/14/2024  Narrative ECHOCARDIOGRAM REPORT    Patient Name:   Vanessa [name] Date of Exam: 02/14/2024 Medical Rec #:  [id number]      Height:       68.0 in Accession #:    [id number]     Weight:       194.0 lb Date of Birth:  17-Feb-1951     BSA:          2.017 m Patient Age:    72 years       BP:           180/76 mmHg Patient Gender: F              HR:           68 bpm. Exam Location:  [address]  Procedure: 2D Echo, Cardiac Doppler, Color Doppler and Intracardiac Opacification Agent (Both Spectral and Color Flow Doppler were utilized during procedure).  Indications:    R06.00 Dyspnea R07.9 Chest Pain  History:        Patient has prior history of Echocardiogram examinations, most recent 07/07/2019. Risk Factors:Hypertension and Dyslipidemia.  Sonographer:    Powell Saras Referring Phys: [id number] SCOTT T WEAVER  IMPRESSIONS   1. Left ventricular ejection fraction, by estimation, is 50 to 55%. The left ventricle  has low normal function. The left ventricle demonstrates regional wall motion abnormalities (see scoring diagram/findings for description). Left ventricular diastolic parameters are consistent with Grade III diastolic dysfunction (restrictive). Elevated left atrial pressure. There is akinesis of the left ventricular, apical apical segment, septal wall and anterior wall. 2. Right ventricular systolic function is normal. The right ventricular size is normal. Tricuspid regurgitation signal is inadequate for assessing PA pressure. 3. Left atrial size was severely dilated. 4. The mitral valve is normal in structure. Trivial mitral valve regurgitation. No evidence of mitral stenosis. 5. The aortic valve is tricuspid. Aortic valve regurgitation is not visualized. Aortic valve sclerosis/calcification is present, without any evidence of aortic stenosis. Aortic valve area, by VTI measures 2.15 cm. Aortic valve mean gradient measures 5.0 mmHg. Aortic valve Vmax measures 1.47 m/s. 6. The inferior vena cava is normal in size with greater than 50% respiratory variability, suggesting right atrial pressure of 3 mmHg.  FINDINGS Left Ventricle: Left ventricular ejection fraction, by estimation, is 50 to 55%. The left ventricle has low normal function. The left ventricle demonstrates regional wall motion abnormalities. Definity  contrast agent was given  IV to delineate the left ventricular endocardial borders. The left ventricular internal cavity size was normal in size. There is no left ventricular hypertrophy. Left ventricular diastolic parameters are consistent with Grade III diastolic dysfunction (restrictive). Elevated left atrial pressure.  Right Ventricle: The right ventricular size is normal. No increase in right ventricular wall thickness. Right ventricular systolic function is normal. Tricuspid regurgitation signal is inadequate for assessing PA pressure.  Left Atrium: Left atrial size was severely  dilated.  Right Atrium: Right atrial size was normal in size.  Pericardium: There is no evidence of pericardial effusion.  Mitral Valve: The mitral valve is normal in structure. Mild mitral annular calcification. Trivial mitral valve regurgitation. No evidence of mitral valve stenosis.  Tricuspid Valve: The tricuspid valve is normal in structure. Tricuspid valve regurgitation is trivial. No evidence of tricuspid stenosis.  Aortic Valve: The aortic valve is tricuspid. Aortic valve regurgitation is not visualized. Aortic valve sclerosis/calcification is present, without any evidence of aortic stenosis. Aortic valve mean gradient measures 5.0 mmHg. Aortic valve peak gradient measures 8.6 mmHg. Aortic valve area, by VTI measures 2.15 cm.  Pulmonic Valve: The pulmonic valve was normal in structure. Pulmonic valve regurgitation is not visualized. No evidence of pulmonic stenosis.  Aorta: The aortic root is normal in size and structure.  Venous: The inferior vena cava is normal in size with greater than 50% respiratory variability, suggesting right atrial pressure of 3 mmHg.  IAS/Shunts: No atrial level shunt detected by color flow Doppler.   LEFT VENTRICLE PLAX 2D LVIDd:         5.30 cm      Diastology LVIDs:         3.70 cm      LV e' medial:    6.56 cm/s LV PW:         0.90 cm      LV E/e' medial:  11.5 LV IVS:        0.90 cm      LV e' lateral:   9.56 cm/s LVOT diam:     2.10 cm      LV E/e' lateral: 7.9 LV SV:         69 LV SV Index:   34 LVOT Area:     3.46 cm  LV Volumes (MOD) LV vol d, MOD A2C: 127.0 ml LV vol d, MOD A4C: 126.0 ml LV vol s, MOD A2C: 55.8 ml LV vol s, MOD A4C: 54.2 ml LV SV MOD A2C:     71.2 ml LV SV MOD A4C:     126.0 ml LV SV MOD BP:      74.5 ml  RIGHT VENTRICLE             IVC RV Basal diam:  3.20 cm     IVC diam: 1.20 cm RV S prime:     10.90 cm/s TAPSE (M-mode): 1.9 cm  LEFT ATRIUM              Index        RIGHT ATRIUM           Index LA diam:         4.70 cm  2.33 cm/m   RA Area:     18.00 cm LA Vol (A2C):   97.0 ml  48.08 ml/m  RA Volume:   49.00 ml  24.29 ml/m LA Vol (A4C):   99.5 ml  49.32 ml/m LA Biplane Vol: 100.0 ml 49.57 ml/m AORTIC VALVE AV Area (  Vmax):    2.16 cm AV Area (Vmean):   2.04 cm AV Area (VTI):     2.15 cm AV Vmax:           147.00 cm/s AV Vmean:          104.000 cm/s AV VTI:            0.320 m AV Peak Grad:      8.6 mmHg AV Mean Grad:      5.0 mmHg LVOT Vmax:         91.80 cm/s LVOT Vmean:        61.200 cm/s LVOT VTI:          0.199 m LVOT/AV VTI ratio: 0.62  AORTA Ao Root diam: 3.10 cm Ao Asc diam:  3.30 cm  MITRAL VALVE MV Area (PHT): 3.01 cm    SHUNTS MV Decel Time: 252 msec    Systemic VTI:  0.20 m MV E velocity: 75.40 cm/s  Systemic Diam: 2.10 cm MV A velocity: 33.40 cm/s MV E/A ratio:  2.26  Wilbert Bihari MD Electronically signed by Wilbert Bihari MD Signature Date/Time: 02/15/2024/8:46:36 AM    Final   TEE  ECHO INTRAOPERATIVE TEE 07/10/2020  Narrative *INTRAOPERATIVE TRANSESOPHAGEAL REPORT *    Patient Name:   Vanessa [name]   Date of Exam: 07/10/2020 Medical Rec #:  [id number]      Height:       68.0 in Accession #:    [id number]     Weight:       181.0 lb Date of Birth:  [birth year]     BSA:          1.96 m Patient Age:    69 years       BP:           144/48 mmHg Patient Gender: F              HR:           51 bpm. Exam Location:  Anesthesiology  Transesophogeal exam was perform intraoperatively during surgical procedure. Patient was closely monitored under general anesthesia during the entirety of examination.  Indications:     CAD Native Vessel Sonographer:     Augustin Seals RDCS (AE) Performing Phys: [id number] EL[address] BROCKS HENDRICKSON Diagnosing Phys: Norleen Pope MD  Complications: No known complications during this procedure. POST-OP IMPRESSIONS - Left Ventricle: The left ventricle is unchanged from pre-bypass. - Right Ventricle: The right ventricle appears  unchanged from pre-bypass. - Aorta: The aorta appears unchanged from pre-bypass. - Left Atrium: The left atrium appears unchanged from pre-bypass. - Left Atrial Appendage: Has been surgically closed. - Aortic Valve: The aortic valve appears unchanged from pre-bypass. - Mitral Valve: The mitral valve appears unchanged from pre-bypass. - Tricuspid Valve: The tricuspid valve appears unchanged from pre-bypass. - Pulmonic Valve: The pulmonic valve appears unchanged from pre-bypass. - Interatrial Septum: The interatrial septum appears unchanged from pre-bypass. - Interventricular Septum: The interventricular septum appears unchanged from pre-bypass. - Pericardium: The pericardium appears unchanged from pre-bypass.  PRE-OP FINDINGS Left Ventricle: The left ventricle has normal systolic function, with an ejection fraction of 55-60%. The cavity size was normal. There is concentric left ventricular hypertrophy. There is mild concentric left ventricular hypertrophy.   Right Ventricle: The right ventricle has normal systolic function. The cavity was normal. There is no increase in right ventricular wall thickness.  Left Atrium: Left atrial size was dilated. No left atrial/left atrial appendage thrombus was detected.  Right  Atrium: Right atrial size was normal in size.  Interatrial Septum: No atrial level shunt detected by color flow Doppler. There is no evidence of a patent foramen ovale.  Pericardium: Trivial pericardial effusion is present.  Mitral Valve: The mitral valve is normal in structure. Mitral valve regurgitation is mild by color flow Doppler. There is no evidence of mitral valve vegetation.  Tricuspid Valve: The tricuspid valve was normal in structure. Tricuspid valve regurgitation is mild by color flow Doppler. There is no evidence of tricuspid valve vegetation.  Aortic Valve: The aortic valve is tricuspid Aortic valve regurgitation was not visualized by color flow Doppler. There is  no stenosis of the aortic valve. There is no evidence of aortic valve vegetation.   Pulmonic Valve: The pulmonic valve was normal in structure. Pulmonic valve regurgitation is trivial by color flow Doppler.   Aorta: There is evidence of plaque in the descending aorta; Grade III, measuring 3-51mm in size.  Shunts: There is no evidence of an atrial septal defect.  +-------------+------------++ AORTIC VALVE              +-------------+------------++ AV Vmax:     150.50 cm/s  +-------------+------------++ AV Vmean:    102.300 cm/s +-------------+------------++ AV VTI:      0.441 m      +-------------+------------++ AV Peak Grad:9.1 mmHg     +-------------+------------++ AV Mean Grad:5.0 mmHg     +-------------+------------++   Norleen Pope MD Electronically signed by Norleen Pope MD Signature Date/Time: 07/10/2020/3:53:43 PM    Final  MONITORS  LONG TERM MONITOR (3-14 DAYS) 04/16/2023  Narrative Patch Wear Time:  7 days and 0 hours (2024-12-23T14:36:24-0500 to 2024-12-30T15:00:03-499)  Patient had a min HR of 45 bpm, max HR of 194 bpm, and avg HR of 60 bpm. Predominant underlying rhythm was Sinus Rhythm. 33 Supraventricular Tachycardia runs occurred, the run with the fastest interval lasting 6 beats with a max rate of 194 bpm, the longest lasting 11 beats with an avg rate of 109 bpm. Idioventricular Rhythm was present. Isolated SVEs were occasional (2.9%, 16160), SVE Couplets were rare (<1.0%, 527), and SVE Triplets were rare (<1.0%, 58). Isolated VEs were rare (<1.0%), VE Couplets were rare (<1.0%), and no VE Triplets were present. Ventricular Trigeminy was present.  SUMMARY: the basic rhythm is normal sinus with an average HR of 60 bpm. There is no atrial fibrillation or flutter. No bradycardic events or high-grade AV block. No sustained arrhythmias. Otherwise, as detailed above with low arrhythmia burden (2.9% SVE's and <1% VE's).   CT  SCANS  CT CORONARY FRACTIONAL FLOW RESERVE DATA PREP 06/23/2020  Narrative EXAM: FFRCT ANALYSIS  FINDINGS: FFRct analysis was performed on the original cardiac CT angiogram dataset. Diagrammatic representation of the FFRct analysis is provided in a separate PDF document in PACS. This dictation was created using the PDF document and an interactive 3D model of the results. 3D model is not available in the EMR/PACS. Normal FFR range is >0.80.  1. Left Main: No significant stenosis. LM FFR = 0.99.  2. LAD: Possible stenosis in mid LAD and diagonal. Proximal FFR = 0.97, Mid FFR = 0.79, Distal FFR could not be assessed. Diagonal: Proximal FFR = 0.96, Mid FFR = 0.62, distal FFR = 0.59.  3. LCX: Possible significant stenosis mid LCX/OM. Proximal FFR = 0.98, Mid FFR = 0.69, Distal FFR= 0.61. OM1: Proximal FFR = 0.96, Mid FFR = 0.74, Distal FFR = 0.69.  4. Ramus:Occluded proximally  5. RCA: Possible significant stenosis in mid  RCA. Proximal FFR = 0.99, Mid FFR = 0.57. Distal FFR could not be assessed.  IMPRESSION: 1. Coronary CTA FFR flow analysis demonstrates possible flow limiting lesions in the mid LAD (0.97>0.79), mid Diagonal (0.96>0.62), mid LCx (0.98>0.69), mid OM1 (0.96>0.69) and mid RCA (0.99>0.57) and occluded Ramus.  2.  Recommend cardiac catheterization.  3.  Findings discussed with Dr. Inocencio.  Wilbert Turner   Electronically Signed By: Wilbert Bihari On: 06/25/2020 17:18   CT SCANS  CT CORONARY MORPH W/CTA COR W/SCORE 06/21/2020  Addendum 06/23/2020 10:52 PM ADDENDUM REPORT: 06/23/2020 22:50  EXAM: Cardiac/Coronary  CT  TECHNIQUE: The patient was scanned on a [employer].  FINDINGS: A 120 kV prospective scan was triggered in the descending thoracic aorta at 111 HU's. Axial non-contrast 3 mm slices were carried out through the heart. The data set was analyzed on a dedicated work station and scored using the Agatson method. Gantry rotation  speed was 250 msecs and collimation was .6 mm. No beta blockade and 0.8 mg of sl NTG was given. The 3D data set was reconstructed in 5% intervals of the 67-82 % of the R-R cycle. Diastolic phases were analyzed on a dedicated work station using MPR, MIP and VRT modes. The patient received 80 cc of contrast.  Aorta:  Normal size.  Scattered calcifications.  No dissection.  Aortic Valve:  Trileaflet.  No calcifications.  Coronary Arteries:  Normal coronary origin.  Right dominance.  RCA is a large dominant artery that gives rise to PDA and PLVB. 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 There is motion artifact in the RCA which limits accurate assessment. There is at least mild calcified plaque in the proximal RCA with associated stenosis of 25-49%. Distal to this lesion there is an ill defined area that may represent non calcified plaque but cannot accurately quantitate stenosis.  Left main is a large artery that gives rise to LAD, Ramus and LCX arteries. There is minimal calcified plaque in the ostial LM with associated stenosis of 0-24%.  LAD is a large vessel that gives rise to a large D1. The LAD is diffusely diseased throughout the proximal and mid portions. There is mild calcified plaque in the proximal LAD with associated stenosis of 25-49%. The calcified plaque extends into the proximal D1 with associated stenosis of 25-49%. There is severe non calcified plaque in the proximal portion of D1 with associated stenosis of 70-99%. There is moderate calcified plaque in the mid LAD with associated stenosis of 50-69%.  The Ramus is a moderate sized vessel. There is a non calcified plaque in the proximal ramus that may be severe with associated stenosis of 70-99% but this is not well visualized. There is mild calcified plaque in the mid to distal ramus with associated stenosis of 25-49%.  LCX is a non-dominant artery that gives rise to one large  OM1 branch. There is mild calcified plaque in the proximal OM1 and mild non calcified plaque with associated stenosis of 25-49%. There is mixed plaque in the mid OM1 that is difficult to define but may be as much as 50-69% stenosis or more. There is moderate non calcified plaque in the mid LCx after the takeoff of OM1 with associated stenosis of 50-69%.  Other findings:  Normal pulmonary vein drainage into the left atrium.  Normal let atrial appendage without a thrombus.  Normal size of the pulmonary artery.  IMPRESSION: 1. Coronary calcium  score of 339. This was  87th percentile for age and sex matched control.  2.  Normal coronary origin with right dominance.  3.  Moderate atherosclerosis.  CAD RADS 3.  4. Consider symptom-guided anti-ischemic and preventive pharmacotherapy as well as risk factor modification per guideline-directed care.  5.  Consider cardiac catheterization.  6.  This study has been submitted for FFR analysis.  Wilbert Bihari   Electronically Signed By: Wilbert Bihari On: 06/23/2020 22:50  Narrative EXAM: OVER-READ INTERPRETATION  CT CHEST  The following report is an over-read performed by radiologist Dr. Toribio Aye of [facility] on 06/21/2020. This over-read does not include interpretation of cardiac or coronary anatomy or pathology. The coronary calcium  score/coronary CTA interpretation by the cardiologist is attached.  COMPARISON:  None.  FINDINGS: Atherosclerotic calcifications in the thoracic aorta. Within the visualized portions of the thorax there are no suspicious appearing pulmonary nodules or masses, there is no acute consolidative airspace disease, no pleural effusions, no pneumothorax and no lymphadenopathy. Visualized portions of the upper abdomen demonstrate an incompletely imaged low-attenuation lesion in the left lobe of the liver, not characterized on today's examination, but statistically likely to  represent a cyst. There are no aggressive appearing lytic or blastic lesions noted in the visualized portions of the skeleton.  IMPRESSION: 1.  Aortic Atherosclerosis (ICD10-I70.0).  Electronically Signed: By: Toribio Aye M.D. On: 06/21/2020 09:12   CT SCANS  CT CARDIAC SCORING (SELF PAY ONLY) 10/13/2017  Addendum 10/13/2017  5:22 PM ADDENDUM REPORT: 10/13/2017 17:19  EXAM: OVER-READ INTERPRETATION  CT CHEST  The following report is an over-read performed by radiologist Dr. Franky Leff [facility] on 10/13/2017. This over-read does not include interpretation of cardiac or coronary anatomy or pathology. The coronary calcium  score interpretation by the cardiologist is attached.  COMPARISON:  None.  FINDINGS: Scattered aortic calcifications. Aorta is normal caliber. Heart is normal size. No adenopathy in the lower mediastinum or hila. Visualized lungs clear. No effusions.  Imaging into the upper abdomen shows no acute findings. Chest wall soft tissues are unremarkable. No acute bony abnormality.  IMPRESSION: No acute or significant extracardiac abnormality.   Electronically Signed By: Franky Crease M.D. On: 10/13/2017 17:19  Narrative CLINICAL DATA:  Risk stratification  EXAM: Coronary Calcium  Score  TECHNIQUE: The patient was scanned on a [employer]. Axial non-contrast 3 mm slices were carried out through the heart. The data set was analyzed on a dedicated work station and scored using the Agatson method.  MEDICATIONS: None.  FINDINGS: Non-cardiac: See separate report from [facility].  Ascending Aorta: Normal size, trivial calcifications in the ascending and moderate calcifications in the descending aorta.  Pericardium: Normal.  Coronary arteries: Normal origin.  IMPRESSION: Coronary calcium  score of 192. This was 72 percentile for age and sex matched control.  Electronically Signed: By: Leim Moose On:  10/13/2017 16:02     ______________________________________________________________________________________________      EKG:   EKG Interpretation Date/Time:  Friday May 05 2024 09:20:09 EST Ventricular Rate:  106 PR Interval:    QRS Duration:  106 QT Interval:  368 QTC Calculation: 488 R Axis:   -43  Text Interpretation: Atrial fibrillation with rapid ventricular response Left axis deviation Incomplete right bundle branch block Minimal voltage criteria for LVH, may be normal variant ( [employer] product ) When compared with ECG of 17-Mar-2024 10:08, Atrial fibrillation has replaced Sinus rhythm Vent. rate has increased BY  46 BPM Nonspecific T wave abnormality, improved in Anterior leads Confirmed by Wonda,  Ozell [phone]) on 05/05/2024 9:29:50 AM    Recent Labs: 02/02/2024: Hemoglobin 11.9; NT-Pro BNP 918; Platelets 198 03/02/2024: BUN 29; Creatinine, Ser 1.20; Potassium 4.0; Sodium 139  Recent Lipid Panel    Component Value Date/Time   CHOL 136 02/02/2023 1607   TRIG 89 02/02/2023 1607   HDL 57 02/02/2023 1607   CHOLHDL 2.4 02/02/2023 1607   LDLCALC 62 02/02/2023 1607     Risk Assessment/Calculations:    CHA2DS2-VASc Score = 4   This indicates a 4.8% annual risk of stroke. The patient's score is based upon: CHF History: 0 HTN History: 1 Diabetes History: 0 Stroke History: 0 Vascular Disease History: 1 Age Score: 1 Gender Score: 1               Physical Exam:    VS:  BP 124/64 (BP Location: Left Arm, Patient Position: Sitting, Cuff Size: Normal)   Pulse (!) 106   Ht 5' 8 (1.727 m)   Wt 188 lb 14.4 oz (85.7 kg)   SpO2 97%   BMI 28.72 kg/m     Wt Readings from Last 3 Encounters:  05/05/24 188 lb 14.4 oz (85.7 kg)  03/17/24 191 lb (86.6 kg)  02/02/24 194 lb (88 kg)     GEN:  Well nourished, well developed in no acute distress HEENT: Normal NECK: No JVD; No carotid bruits LYMPHATICS: No lymphadenopathy CARDIAC: Irregularly irregular, no murmurs,  rubs, gallops RESPIRATORY:  Clear to auscultation without rales, wheezing or rhonchi  ABDOMEN: Soft, non-tender, non-distended MUSCULOSKELETAL:  No edema; No deformity  SKIN: Warm and dry NEUROLOGIC:  Alert and oriented x 3 PSYCHIATRIC:  Normal affect   Assessment & Plan Coronary artery disease involving native coronary artery of native heart without angina pectoris S/P CABG 2022. No antiplatelet Rx because of chronic oral anticoagulation. Treated with isosorbide  and rosuvastatin . Paroxysmal atrial fibrillation (HCC) The patient is back in atrial fibrillation today.  I reviewed her past EKGs today and she has been in sinus rhythm and all EKGs over the past several years.  The patient was seen by Dr. Inocencio in 2022 and was felt to be stable on current medications, maintaining sinus rhythm at that time.  She underwent left atrial appendage clipping at the time of her CABG in 2022. Symptoms have been present now for about one week. She has been compliant with apixaban . Reviewed labs and TSH/fT4/total T3 all normal.  Since she has been maintaining sinus rhythm and is now symptomatic with atrial fibrillation over the past week, I recommended that we proceed with elective cardioversion.  The patient has been compliant with apixaban  with no missed doses over the past 3 weeks.  She understands the importance of continuing this as prescribed.  Will arrange cardioversion.  Full informed consent is obtained (see below).  Will arrange follow-up in the atrial fibrillation clinic.  I think she is at relatively high risk of recurrence with severe atrial enlargement on echo.  She may ultimately require antiarrhythmic drug therapy and we discussed this today.  Patient is status post left atrial appendage clipping. Essential hypertension Treated with diltiazem , isosorbide , and valsartan /hydrochlorothiazide .  Increase diltiazem  to 240 mg daily for better rate control. Bilateral carotid artery stenosis Hx of left CEA  2023. Managed by vascular surgery. Pre-procedure lab exam        Informed Consent   Shared Decision Making/Informed Consent The risks (stroke, cardiac arrhythmias rarely resulting in the need for a temporary or permanent pacemaker, skin irritation or burns and complications  associated with conscious sedation including aspiration, arrhythmia, respiratory failure and death), benefits (restoration of normal sinus rhythm) and alternatives of a direct current cardioversion were explained in detail to Vanessa [name] and she agrees to proceed.         Medication Adjustments/Labs and Tests Ordered: Current medicines are reviewed at length with the patient today.  Concerns regarding medicines are outlined above.  Orders Placed This Encounter  Procedures   Basic metabolic panel with GFR   CBC   Amb Referral to AFIB Clinic   EKG 12-Lead   Meds ordered this encounter  Medications   diltiazem  (CARDIZEM  CD) 240 MG 24 hr capsule    Sig: Take 1 capsule (240 mg total) by mouth daily.    Dispense:  30 capsule    Refill:  3    Increased to 240 mg    Patient Instructions  Medication Instructions:  INCREASE Diltiazem  to 240 mg once daily  *If you need a refill on your cardiac medications before your next appointment, please call your pharmacy*  Lab Work: To be completed today: BMP and CBC  If you have labs (blood work) drawn today and your tests are completely normal, you will receive your results only by: MyChart Message (if you have MyChart) OR A paper copy in the mail If you have any lab test that is abnormal or we need to change your treatment, we will call you to review the results.  Testing/Procedures: Your physician has recommended that you have a Cardioversion (DCCV). Electrical Cardioversion uses a jolt of electricity to your heart either through paddles or wired patches attached to your chest. This is a controlled, usually prescheduled, procedure. Defibrillation is done under light  anesthesia in the hospital, and you usually go home the day of the procedure. This is done to get your heart back into a normal rhythm. You are not awake for the procedure. Please see the instruction sheet given to you today.    Dear Vanessa [name]  You are scheduled for a Cardioversion on Thursday, November 29 with Dr. Madonna Large.  Please arrive at the [facility] (Main Entrance A) at [facility]: [address] [place], [place] [address] at 11:00 AM (This time is 1 hour(s) before your procedure to ensure your preparation).   Free valet parking service is available. You will check in at ADMITTING.   *Please Note: You will receive a call the day before your procedure to confirm the appointment time. That time may have changed from the original time based on the schedule for that day.*   DIET:  Nothing to eat or drink after midnight except a sip of water  with medications (see medication instructions below)  MEDICATION INSTRUCTIONS: !!IF ANY NEW MEDICATIONS ARE STARTED AFTER TODAY, PLEASE NOTIFY YOUR PROVIDER AS SOON AS POSSIBLE!!  FYI: Medications such as Semaglutide (Ozempic, Wegovy), Tirzepatide (Mounjaro, Zepbound), Dulaglutide (Trulicity), etc (GLP1 agonists) AND Canagliflozin (Invokana), Dapagliflozin  (Farxiga ), Empagliflozin (Jardiance), Ertugliflozin (Steglatro), Bexagliflozin [employer]) or any combination with one of these drugs such as Invokamet (Canagliflozin/Metformin), Synjardy (Empagliflozin/Metformin), etc (SGLT2 inhibitors) must be held around the time of a procedure. This is not a comprehensive list of all of these drugs. Please review all of your medications and talk to your provider if you take any one of these. If you are not sure, ask your provider.            HOLD: Dapagliflozin  (Farxiga ) for 3 days prior to the procedure. Last dose on Sunday, January 25.  HOLD: Lasix  and Diovan  the morning of your procedure (05/11/2024)          VERY IMPORTANT:  Continue taking your anticoagulant (blood thinner): Apixaban  (Eliquis ).  You will need to continue this after your procedure until you are told by your provider that it is safe to stop.    LABS:  Done 05/05/24   FYI:  For your safety, and to allow us  to monitor your vital signs accurately during the surgery/procedure we request: If you have artificial nails, gel coating, SNS etc, please have those removed prior to your surgery/procedure. Not having the nail coverings /polish removed may result in cancellation or delay of your surgery/procedure.  Your support person will be asked to wait in the waiting room during your procedure.  It is OK to have someone drop you off and come back when you are ready to be discharged.  You cannot drive after the procedure and will need someone to drive you home.  Bring your insurance cards.  *Special Note: Every effort is made to have your procedure done on time. Occasionally there are emergencies that occur at the hospital that may cause delays. Please be patient if a delay does occur.      Follow-Up: At [facility], you and your health needs are our priority.  As part of our continuing mission to provide you with exceptional heart care, our providers are all part of one team.  This team includes your primary Cardiologist (physician) and Advanced Practice Providers or APPs (Physician Assistants and Nurse Practitioners) who all work together to provide you with the care you need, when you need it.  You have been referred to our atrial fibrillation clinic. Our office will call you to schedule this appointment.   Your next appointment:   3 month(s)  Provider:   Glendia Ferrier, PA-C      Signed, Ozell Fell, MD  05/05/2024 1:20 PM    [facility]     [1]  Current Meds  Medication Sig   apixaban  (ELIQUIS ) 5 MG TABS tablet Take 1 tablet (5 mg total) by mouth 2 (two) times daily.   clotrimazole-betamethasone (LOTRISONE) cream  Apply 1 application. topically 2 (two) times daily as needed (irritation).   Coenzyme Q10 100 MG capsule Take 100 mg by mouth daily.   dapagliflozin  propanediol (FARXIGA ) 10 MG TABS tablet Take 1 tablet (10 mg total) by mouth daily.   diltiazem  (CARDIZEM  CD) 240 MG 24 hr capsule Take 1 capsule (240 mg total) by mouth daily.   esomeprazole  (NEXIUM ) 40 MG capsule Take 40 mg by mouth daily at 12 noon.   furosemide  (LASIX ) 20 MG tablet Take 1 tablet (20 mg) twice a week. Take 1 tab extra if weight increases 3 lbs or more in 1 day.   isosorbide  mononitrate (IMDUR ) 30 MG 24 hr tablet Take 1 tablet (30 mg total) by mouth daily.   Multiple Vitamins-Minerals (ALIVE ONCE DAILY WOMENS 50+) TABS Take 1 tablet by mouth daily.   nitroGLYCERIN  (NITROSTAT ) 0.4 MG SL tablet Place 1 tablet (0.4 mg total) under the tongue every 5 (five) minutes as needed for chest pain.   Polyethylene Glycol 400 (BLINK TEARS) 0.25 % SOLN Place 1 drop into both eyes daily as needed (Dry eyes).   Probiotic Product (PROBIOTIC DAILY PO) Take by mouth. Taking over the counter Floragen   rosuvastatin  (CRESTOR ) 20 MG tablet Take 1 tablet (20 mg total) by mouth daily.   valsartan -hydrochlorothiazide  (DIOVAN  HCT) 160-12.5 MG tablet Take  1 tablet by mouth daily.   [DISCONTINUED] diltiazem  (CARDIZEM  SR) 120 MG 12 hr capsule Take 1 capsule (120 mg total) by mouth daily.   "

## 2024-05-05 NOTE — Assessment & Plan Note (Addendum)
 The patient is back in atrial fibrillation today.  I reviewed her past EKGs today and she has been in sinus rhythm and all EKGs over the past several years.  The patient was seen by Dr. Inocencio in 2022 and was felt to be stable on current medications, maintaining sinus rhythm at that time.  She underwent left atrial appendage clipping at the time of her CABG in 2022. Symptoms have been present now for about one week. She has been compliant with apixaban . Reviewed labs and TSH/fT4/total T3 all normal.  Since she has been maintaining sinus rhythm and is now symptomatic with atrial fibrillation over the past week, I recommended that we proceed with elective cardioversion.  The patient has been compliant with apixaban  with no missed doses over the past 3 weeks.  She understands the importance of continuing this as prescribed.  Will arrange cardioversion.  Full informed consent is obtained (see below).  Will arrange follow-up in the atrial fibrillation clinic.  I think she is at relatively high risk of recurrence with severe atrial enlargement on echo.  She may ultimately require antiarrhythmic drug therapy and we discussed this today.  Patient is status post left atrial appendage clipping.

## 2024-05-05 NOTE — Assessment & Plan Note (Addendum)
 Hx of left CEA 2023. Managed by vascular surgery.

## 2024-05-05 NOTE — Assessment & Plan Note (Addendum)
 S/P CABG 2022. No antiplatelet Rx because of chronic oral anticoagulation. Treated with isosorbide  and rosuvastatin .

## 2024-05-05 NOTE — Patient Instructions (Addendum)
 Medication Instructions:  INCREASE Diltiazem  to 240 mg once daily  *If you need a refill on your cardiac medications before your next appointment, please call your pharmacy*  Lab Work: To be completed today: BMP and CBC  If you have labs (blood work) drawn today and your tests are completely normal, you will receive your results only by: MyChart Message (if you have MyChart) OR A paper copy in the mail If you have any lab test that is abnormal or we need to change your treatment, we will call you to review the results.  Testing/Procedures: Your physician has recommended that you have a Cardioversion (DCCV). Electrical Cardioversion uses a jolt of electricity to your heart either through paddles or wired patches attached to your chest. This is a controlled, usually prescheduled, procedure. Defibrillation is done under light anesthesia in the hospital, and you usually go home the day of the procedure. This is done to get your heart back into a normal rhythm. You are not awake for the procedure. Please see the instruction sheet given to you today.    Dear Vanessa [name]  You are scheduled for a Cardioversion on Thursday, January 29 with Dr. Madonna Large.  Please arrive at the [facility] (Main Entrance A) at [facility]: [address] [place], [place] [address] at 11:00 AM (This time is 1 hour(s) before your procedure to ensure your preparation).   Free valet parking service is available. You will check in at ADMITTING.   *Please Note: You will receive a call the day before your procedure to confirm the appointment time. That time may have changed from the original time based on the schedule for that day.*   DIET:  Nothing to eat or drink after midnight except a sip of water  with medications (see medication instructions below)  MEDICATION INSTRUCTIONS: !!IF ANY NEW MEDICATIONS ARE STARTED AFTER TODAY, PLEASE NOTIFY YOUR PROVIDER AS SOON AS POSSIBLE!!  FYI: Medications such as  Semaglutide (Ozempic, Wegovy), Tirzepatide (Mounjaro, Zepbound), Dulaglutide (Trulicity), etc (GLP1 agonists) AND Canagliflozin (Invokana), Dapagliflozin  (Farxiga ), Empagliflozin (Jardiance), Ertugliflozin (Steglatro), Bexagliflozin [employer]) or any combination with one of these drugs such as Invokamet (Canagliflozin/Metformin), Synjardy (Empagliflozin/Metformin), etc (SGLT2 inhibitors) must be held around the time of a procedure. This is not a comprehensive list of all of these drugs. Please review all of your medications and talk to your provider if you take any one of these. If you are not sure, ask your provider.            HOLD: Dapagliflozin  (Farxiga ) for 3 days prior to the procedure. Last dose on Sunday, January 25.           HOLD: Lasix  and Diovan  the morning of your procedure (05/11/2024)          VERY IMPORTANT: Continue taking your anticoagulant (blood thinner): Apixaban  (Eliquis ).  You will need to continue this after your procedure until you are told by your provider that it is safe to stop.    LABS:  Done 05/05/24   FYI:  For your safety, and to allow us  to monitor your vital signs accurately during the surgery/procedure we request: If you have artificial nails, gel coating, SNS etc, please have those removed prior to your surgery/procedure. Not having the nail coverings /polish removed may result in cancellation or delay of your surgery/procedure.  Your support person will be asked to wait in the waiting room during your procedure.  It is OK to have someone drop you off and  come back when you are ready to be discharged.  You cannot drive after the procedure and will need someone to drive you home.  Bring your insurance cards.  *Special Note: Every effort is made to have your procedure done on time. Occasionally there are emergencies that occur at the hospital that may cause delays. Please be patient if a delay does occur.      Follow-Up: At [facility], you  and your health needs are our priority.  As part of our continuing mission to provide you with exceptional heart care, our providers are all part of one team.  This team includes your primary Cardiologist (physician) and Advanced Practice Providers or APPs (Physician Assistants and Nurse Practitioners) who all work together to provide you with the care you need, when you need it.  You have been referred to our atrial fibrillation clinic. Our office will call you to schedule this appointment.   Your next appointment:   3 month(s)  Provider:   Glendia Ferrier, PA-C

## 2024-05-06 ENCOUNTER — Ambulatory Visit: Payer: Self-pay | Admitting: Cardiovascular Disease

## 2024-05-06 LAB — BASIC METABOLIC PANEL WITH GFR
BUN/Creatinine Ratio: 21 (ref 12–28)
BUN: 27 mg/dL (ref 8–27)
CO2: 25 mmol/L (ref 20–29)
Calcium: 9.3 mg/dL (ref 8.7–10.3)
Chloride: 98 mmol/L (ref 96–106)
Creatinine, Ser: 1.3 mg/dL — ABNORMAL HIGH (ref 0.57–1.00)
Glucose: 107 mg/dL — ABNORMAL HIGH (ref 70–99)
Potassium: 3.7 mmol/L (ref 3.5–5.2)
Sodium: 139 mmol/L (ref 134–144)
eGFR: 43 mL/min/{1.73_m2} — ABNORMAL LOW

## 2024-05-06 LAB — CBC
Hematocrit: 38.2 % (ref 34.0–46.6)
Hemoglobin: 12.2 g/dL (ref 11.1–15.9)
MCH: 28.5 pg (ref 26.6–33.0)
MCHC: 31.9 g/dL (ref 31.5–35.7)
MCV: 89 fL (ref 79–97)
Platelets: 226 10*3/uL (ref 150–450)
RBC: 4.28 x10E6/uL (ref 3.77–5.28)
RDW: 14.7 % (ref 11.7–15.4)
WBC: 9.4 10*3/uL (ref 3.4–10.8)

## 2024-05-11 ENCOUNTER — Ambulatory Visit (HOSPITAL_COMMUNITY): Admitting: Anesthesiology

## 2024-05-11 ENCOUNTER — Encounter (HOSPITAL_COMMUNITY): Payer: Self-pay | Admitting: Cardiology

## 2024-05-11 ENCOUNTER — Encounter (HOSPITAL_COMMUNITY): Admission: RE | Disposition: A | Payer: Self-pay | Source: Home / Self Care | Attending: Cardiology

## 2024-05-11 ENCOUNTER — Ambulatory Visit (HOSPITAL_COMMUNITY)
Admission: RE | Admit: 2024-05-11 | Discharge: 2024-05-11 | Disposition: A | Discharge status: Home / Self Care | Attending: Cardiology | Admitting: Cardiology

## 2024-05-11 ENCOUNTER — Ambulatory Visit (HOSPITAL_COMMUNITY)

## 2024-05-11 ENCOUNTER — Other Ambulatory Visit: Payer: Self-pay

## 2024-05-11 DIAGNOSIS — I6523 Occlusion and stenosis of bilateral carotid arteries: Secondary | ICD-10-CM | POA: Insufficient documentation

## 2024-05-11 DIAGNOSIS — I4891 Unspecified atrial fibrillation: Secondary | ICD-10-CM | POA: Diagnosis not present

## 2024-05-11 DIAGNOSIS — Z7901 Long term (current) use of anticoagulants: Secondary | ICD-10-CM | POA: Diagnosis not present

## 2024-05-11 DIAGNOSIS — I081 Rheumatic disorders of both mitral and tricuspid valves: Secondary | ICD-10-CM | POA: Insufficient documentation

## 2024-05-11 DIAGNOSIS — Z79899 Other long term (current) drug therapy: Secondary | ICD-10-CM | POA: Diagnosis not present

## 2024-05-11 DIAGNOSIS — F418 Other specified anxiety disorders: Secondary | ICD-10-CM

## 2024-05-11 DIAGNOSIS — I2510 Atherosclerotic heart disease of native coronary artery without angina pectoris: Secondary | ICD-10-CM

## 2024-05-11 DIAGNOSIS — I110 Hypertensive heart disease with heart failure: Secondary | ICD-10-CM | POA: Diagnosis not present

## 2024-05-11 DIAGNOSIS — I480 Paroxysmal atrial fibrillation: Secondary | ICD-10-CM | POA: Insufficient documentation

## 2024-05-11 DIAGNOSIS — I3139 Other pericardial effusion (noninflammatory): Secondary | ICD-10-CM | POA: Insufficient documentation

## 2024-05-11 DIAGNOSIS — Z951 Presence of aortocoronary bypass graft: Secondary | ICD-10-CM | POA: Insufficient documentation

## 2024-05-11 DIAGNOSIS — I5032 Chronic diastolic (congestive) heart failure: Secondary | ICD-10-CM | POA: Diagnosis not present

## 2024-05-11 DIAGNOSIS — I10 Essential (primary) hypertension: Secondary | ICD-10-CM | POA: Diagnosis not present

## 2024-05-11 DIAGNOSIS — E785 Hyperlipidemia, unspecified: Secondary | ICD-10-CM | POA: Diagnosis not present

## 2024-05-11 MED ORDER — PHENYLEPHRINE HCL-NACL 20-0.9 MG/250ML-% IV SOLN
INTRAVENOUS | Status: DC | PRN
  Administered 2024-05-11: 30 ug/min via INTRAVENOUS

## 2024-05-11 MED ORDER — LIDOCAINE 2% (20 MG/ML) 5 ML SYRINGE
INTRAMUSCULAR | Status: DC | PRN
  Administered 2024-05-11: 100 mg via INTRAVENOUS

## 2024-05-11 MED ORDER — SODIUM CHLORIDE 0.9% FLUSH: 3.0000 mL | Freq: Two times a day (BID) | INTRAVENOUS | Status: DC

## 2024-05-11 MED ORDER — PROPOFOL 500 MG/50ML IV EMUL
INTRAVENOUS | Status: DC | PRN
  Administered 2024-05-11: 87 ug/kg/min via INTRAVENOUS

## 2024-05-11 MED ORDER — SODIUM CHLORIDE 0.9% FLUSH: 3.0000 mL | INTRAVENOUS | Status: DC | PRN

## 2024-05-11 MED ORDER — SODIUM CHLORIDE 0.9 % IV SOLN: 250.0000 mL | INTRAVENOUS | Status: DC | PRN

## 2024-05-11 NOTE — Discharge Instructions (Signed)
Electrical Cardioversion Electrical cardioversion is the delivery of a jolt of electricity to restore a normal rhythm to the heart. A rhythm that is too fast or is not regular keeps the heart from pumping well. In this procedure, sticky patches or metal paddles are placed on the chest to deliver electricity to the heart from a device. This procedure may be done in an emergency if: There is low or no blood pressure as a result of the heart rhythm. Normal rhythm must be restored as fast as possible to protect the brain and heart from further damage. It may save a life. This may also be a scheduled procedure for irregular or fast heart rhythms that are not immediately life-threatening.  What can I expect after the procedure? Your blood pressure, heart rate, breathing rate, and blood oxygen level will be monitored until you leave the hospital or clinic. Your heart rhythm will be watched to make sure it does not change. You may have some redness on the skin where the shocks were given. Over the counter cortizone cream may be helpful.  Follow these instructions at home: Do not drive for 24 hours if you were given a sedative during your procedure. Take over-the-counter and prescription medicines only as told by your health care provider. Ask your health care provider how to check your pulse. Check it often. Rest for 48 hours after the procedure or as told by your health care provider. Avoid or limit your caffeine use as told by your health care provider. Keep all follow-up visits as told by your health care provider. This is important. Contact a health care provider if: You feel like your heart is beating too quickly or your pulse is not regular. You have a serious muscle cramp that does not go away. Get help right away if: You have discomfort in your chest. You are dizzy or you feel faint. You have trouble breathing or you are short of breath. Your speech is slurred. You have trouble moving an  arm or leg on one side of your body. Your fingers or toes turn cold or blue. Summary Electrical cardioversion is the delivery of a jolt of electricity to restore a normal rhythm to the heart. This procedure may be done right away in an emergency or may be a scheduled procedure if the condition is not an emergency. Generally, this is a safe procedure. After the procedure, check your pulse often as told by your health care provider. This information is not intended to replace advice given to you by your health care provider. Make sure you discuss any questions you have with your health care provider. Document Revised: 10/31/2018 Document Reviewed: 10/31/2018 Elsevier Patient Education  2020 [employer] TEE  YOU HAD AN CARDIAC PROCEDURE TODAY: Refer to the procedure report and other information in the discharge instructions given to you for any specific questions about what was found during the examination. If this information does not answer your questions, please call [facility] office at [phone] to clarify.   DIET: Your first meal following the procedure should be a light meal and then it is ok to progress to your normal diet. A half-sandwich or bowl of soup is an example of a good first meal. Heavy or fried foods are harder to digest and may make you feel nauseous or bloated. Drink plenty of fluids but you should avoid alcoholic beverages for 24 hours. If you had a esophageal dilation, please see attached instructions for diet.   ACTIVITY: Your   care partner should take you home directly after the procedure. You should plan to take it easy, moving slowly for the rest of the day. You can resume normal activity the day after the procedure however YOU SHOULD NOT DRIVE, use power tools, machinery or perform tasks that involve climbing or major physical exertion for 24 hours (because of the sedation medicines used during the test).   SYMPTOMS TO REPORT IMMEDIATELY: A cardiologist can be  reached at any hour. Please call [phone] for any of the following symptoms:  Vomiting of blood or coffee ground material  New, significant abdominal pain  New, significant chest pain or pain under the shoulder blades  Painful or persistently difficult swallowing  New shortness of breath  Black, tarry-looking or red, bloody stools  FOLLOW UP:  Please also call with any specific questions about appointments or follow up tests.  

## 2024-05-11 NOTE — Interval H&P Note (Signed)
 History and Physical Interval Note:  05/11/2024 11:07 AM  [name]  has presented today for surgery, with the diagnosis of AFIB.  The various methods of treatment have been discussed with the patient and family. After consideration of risks, benefits and other options for treatment, the patient has consented to  Procedures: Transesophageal echocardiogram guided CARDIOVERSION (N/A) as a surgical intervention.  The patient's history has been reviewed, patient examined, no change in status, stable for surgery.  I have reviewed the patient's chart and labs.  Questions were answered to the patient's satisfaction.    Informed Consent   Shared Decision Making/Informed Consent   The risks [stroke, cardiac arrhythmias rarely resulting in the need for a temporary or permanent pacemaker, skin irritation or burns, esophageal damage, perforation (1:10,000 risk), bleeding, pharyngeal hematoma as well as other potential complications associated with conscious sedation including aspiration, arrhythmia, respiratory failure and death], benefits (treatment guidance, restoration of normal sinus rhythm, diagnostic support) and alternatives of a transesophageal echocardiogram guided cardioversion were discussed in detail with [name] and she is willing to proceed.     Patient is not sure if she has taken her Eliquis  regularly for the last 3 weeks.  For safety patient was consented for TEE guided cardioversion.  Contact person: husband, Zachary [phone]  Madonna Large, DO, [facility]  A Division of [facility] [address], [place], [place] [address]

## 2024-05-11 NOTE — Anesthesia Preprocedure Evaluation (Signed)
 "                                  Anesthesia Evaluation  Patient identified by MRN, date of birth, ID band Patient awake    Reviewed: Allergy & Precautions, NPO status , Patient's Chart, lab work & pertinent test results  Airway Mallampati: II  TM Distance: >3 FB Neck ROM: Full    Dental  (+) Partial Upper, Partial Lower   Pulmonary neg pulmonary ROS   Pulmonary exam normal        Cardiovascular hypertension, Pt. on medications + CAD and + CABG (2022)  + dysrhythmias Atrial Fibrillation  Rhythm:Irregular Rate:Normal  ECHO: IMPRESSIONS    1. Left ventricular ejection fraction, by estimation, is 50 to 55%. The left ventricle has low normal function. The left ventricle demonstrates regional wall motion abnormalities (see scoring diagram/findings for description). Left ventricular diastolic  parameters are consistent with Grade III diastolic dysfunction (restrictive). Elevated left atrial pressure. There is akinesis of the left ventricular, apical apical segment, septal wall and anterior wall.  2. Right ventricular systolic function is normal. The right ventricular size is normal. Tricuspid regurgitation signal is inadequate for assessing PA pressure.  3. Left atrial size was severely dilated.  4. The mitral valve is normal in structure. Trivial mitral valve regurgitation. No evidence of mitral stenosis.  5. The aortic valve is tricuspid. Aortic valve regurgitation is not visualized. Aortic valve sclerosis/calcification is present, without any evidence of aortic stenosis. Aortic valve area, by VTI measures 2.15 cm. Aortic valve mean gradient measures 5.0 mmHg. Aortic valve Vmax measures 1.47 m/s.  6. The inferior vena cava is normal in size with greater than 50% respiratory variability, suggesting right atrial pressure of 3 mmHg.      Neuro/Psych   Anxiety Depression    negative neurological ROS     GI/Hepatic Neg liver ROS,GERD  ,,  Endo/Other  negative endocrine  ROS    Renal/GU negative Renal ROS  negative genitourinary   Musculoskeletal  (+) Arthritis , Osteoarthritis,    Abdominal Normal abdominal exam  (+)   Peds  Hematology  (+) Blood dyscrasia, anemia Lab Results      Component                Value               Date                      WBC                      9.4                 05/05/2024                HGB                      12.2                05/05/2024                HCT                      38.2                05/05/2024  MCV                      89                  05/05/2024                PLT                      226                 05/05/2024              Anesthesia Other Findings   Reproductive/Obstetrics                              Anesthesia Physical Anesthesia Plan  ASA: 3  Anesthesia Plan: General   Post-op Pain Management:    Induction: Intravenous  PONV Risk Score and Plan: 3 and Treatment may vary due to age or medical condition  Airway Management Planned: Simple Face Mask and Nasal Cannula  Additional Equipment: None  Intra-op Plan:   Post-operative Plan:   Informed Consent: I have reviewed the patients History and Physical, chart, labs and discussed the procedure including the risks, benefits and alternatives for the proposed anesthesia with the patient or authorized representative who has indicated his/her understanding and acceptance.     Dental advisory given  Plan Discussed with: CRNA  Anesthesia Plan Comments:         Anesthesia Quick Evaluation  "

## 2024-05-11 NOTE — Transfer of Care (Signed)
 Immediate Anesthesia Transfer of Care Note  Patient: Vanessa [name]  Procedure(s) Performed: CARDIOVERSION TRANSESOPHAGEAL ECHOCARDIOGRAM  Patient Location: PACU  Anesthesia Type:MAC  Level of Consciousness: awake  Airway & Oxygen Therapy: Patient Spontanous Breathing and Patient connected to nasal cannula oxygen  Post-op Assessment: Report given to RN and Post -op Vital signs reviewed and stable  Post vital signs: Reviewed and stable  Last Vitals:  Vitals Value Taken Time  BP 107/67 05/11/24 12:40  Temp 36.6 C 05/11/24 12:34  Pulse 65 05/11/24 12:41  Resp 16 05/11/24 12:41  SpO2 96 % 05/11/24 12:41  Vitals shown include unfiled device data.  Last Pain:  Vitals:   05/11/24 1234  TempSrc: Tympanic  PainSc: Asleep         Complications: No notable events documented.

## 2024-05-11 NOTE — Anesthesia Postprocedure Evaluation (Signed)
"   Anesthesia Post Note  Patient: Vanessa [name] Decoste  Procedure(s) Performed: CARDIOVERSION TRANSESOPHAGEAL ECHOCARDIOGRAM     Patient location during evaluation: PACU Anesthesia Type: General Level of consciousness: awake and alert Pain management: pain level controlled Vital Signs Assessment: post-procedure vital signs reviewed and stable Respiratory status: spontaneous breathing, nonlabored ventilation, respiratory function stable and patient connected to nasal cannula oxygen Cardiovascular status: blood pressure returned to baseline and stable Postop Assessment: no apparent nausea or vomiting Anesthetic complications: no   No notable events documented.  Last Vitals:  Vitals:   05/11/24 1244 05/11/24 1254  BP: (!) 105/54 115/60  Pulse: 63 60  Resp: 16 14  Temp:    SpO2: 96% 97%    Last Pain:  Vitals:   05/11/24 1254  TempSrc:   PainSc: 0-No pain                 [name]      "

## 2024-05-11 NOTE — Progress Notes (Signed)
" °  Echocardiogram Echocardiogram Transesophageal has been performed.  LAMON MAXWELL 05/11/2024, 12:39 PM "

## 2024-05-11 NOTE — CV Procedure (Signed)
" ° °  TRANSESOPHAGEAL ECHOCARDIOGRAM GUIDED DIRECT CURRENT CARDIOVERSION  NAME:  Vanessa [name]    MRN: [id number] DOB:  Oct 27, 1950    ADMIT DATE: 05/11/2024  INDICATIONS: Symptomatic atrial fibrillation  PROCEDURE:   Informed consent was obtained prior to the procedure. The risks, benefits and alternatives for the procedure were discussed and the patient comprehended these risks.  Risks include, but are not limited to, cough, sore throat, vomiting, nausea, somnolence, esophageal and stomach trauma or perforation, bleeding, low blood pressure, aspiration, pneumonia, infection, trauma to the teeth and death.    After a procedural time-out patient was sedated by the anesthesia service. The transesophageal probe was inserted in the esophagus and stomach without difficulty and multiple views were obtained. Anesthesia was monitored by Dr. Stoltzfus.   COMPLICATIONS:    Complications: No complications Patient tolerated procedure well.  KEY FINDINGS:  No significant residual LAA s/p atrial clip  No spontaneous contrast or thrombus.  Full Report to follow.   CARDIOVERSION:     Indications:  Symptomatic atrial fibrillation  Procedure Details:  Once the TEE was complete, the patient had the defibrillator pads placed in the anterior and posterior position. Once an appropriate level of sedation was confirmed, the patient was cardioverted x 1 with 200J of biphasic synchronized energy.  The patient converted to NSR.  There were no apparent complications.  The patient had normal neuro status and respiratory status post procedure with vitals stable as recorded elsewhere.  Adequate airway was maintained throughout and vital signs monitored per protocol.  Updated husband over the phone  Continue current home meds.   Madonna Michele HAS, [facility]  A Division of [facility] [address] [place], [place] [address]   "

## 2024-05-12 LAB — ECHO TEE

## 2024-05-15 ENCOUNTER — Encounter: Payer: Self-pay | Admitting: Cardiovascular Disease

## 2024-05-23 ENCOUNTER — Ambulatory Visit (HOSPITAL_COMMUNITY): Admitting: Internal Medicine

## 2024-08-02 ENCOUNTER — Ambulatory Visit: Admitting: Physician Assistant
